# Patient Record
Sex: Male | Born: 1965 | Hispanic: No | Marital: Married | Smoking: Never smoker
Health system: Southern US, Community
[De-identification: ages and names within clinical notes are randomized; demographics above are authoritative.]

## PROBLEM LIST (undated history)

## (undated) DIAGNOSIS — I10 Essential (primary) hypertension: Secondary | ICD-10-CM

## (undated) DIAGNOSIS — K219 Gastro-esophageal reflux disease without esophagitis: Secondary | ICD-10-CM

## (undated) DIAGNOSIS — E785 Hyperlipidemia, unspecified: Secondary | ICD-10-CM

## (undated) DIAGNOSIS — I739 Peripheral vascular disease, unspecified: Secondary | ICD-10-CM

## (undated) DIAGNOSIS — E119 Type 2 diabetes mellitus without complications: Secondary | ICD-10-CM

## (undated) HISTORY — DX: Hyperlipidemia, unspecified: E78.5

## (undated) HISTORY — DX: Gastro-esophageal reflux disease without esophagitis: K21.9

## (undated) HISTORY — DX: Type 2 diabetes mellitus without complications: E11.9

## (undated) HISTORY — PX: OTHER SURGICAL HISTORY: SHX169

## (undated) HISTORY — PX: TONSILLECTOMY: SUR1361

## (undated) HISTORY — DX: Essential (primary) hypertension: I10

## (undated) HISTORY — PX: WISDOM TOOTH EXTRACTION: SHX21

## (undated) HISTORY — DX: Peripheral vascular disease, unspecified: I73.9

---

## 2017-04-19 ENCOUNTER — Ambulatory Visit (INDEPENDENT_AMBULATORY_CARE_PROVIDER_SITE_OTHER): Payer: BLUE CROSS/BLUE SHIELD

## 2017-04-19 ENCOUNTER — Encounter: Payer: Self-pay | Admitting: Family Medicine

## 2017-04-19 ENCOUNTER — Ambulatory Visit (INDEPENDENT_AMBULATORY_CARE_PROVIDER_SITE_OTHER): Payer: BLUE CROSS/BLUE SHIELD | Admitting: Family Medicine

## 2017-04-19 VITALS — BP 134/96 | HR 103 | Temp 98.8°F | Resp 18 | Ht 75.0 in | Wt 250.6 lb

## 2017-04-19 DIAGNOSIS — M542 Cervicalgia: Secondary | ICD-10-CM

## 2017-04-19 DIAGNOSIS — R739 Hyperglycemia, unspecified: Secondary | ICD-10-CM

## 2017-04-19 DIAGNOSIS — Z131 Encounter for screening for diabetes mellitus: Secondary | ICD-10-CM

## 2017-04-19 DIAGNOSIS — M5412 Radiculopathy, cervical region: Secondary | ICD-10-CM | POA: Diagnosis not present

## 2017-04-19 DIAGNOSIS — M898X1 Other specified disorders of bone, shoulder: Secondary | ICD-10-CM | POA: Diagnosis not present

## 2017-04-19 LAB — GLUCOSE, POCT (MANUAL RESULT ENTRY): POC GLUCOSE: 413 mg/dL — AB (ref 70–99)

## 2017-04-19 MED ORDER — CYCLOBENZAPRINE HCL 5 MG PO TABS: 5.0000 mg | ORAL_TABLET | Freq: Three times a day (TID) | ORAL | 0 refills | Status: DC | PRN

## 2017-04-19 MED ORDER — TRAMADOL HCL 50 MG PO TABS: 50.0000 mg | ORAL_TABLET | Freq: Four times a day (QID) | ORAL | 0 refills | Status: DC | PRN

## 2017-04-19 MED ORDER — METFORMIN HCL 500 MG PO TABS: 500.0000 mg | ORAL_TABLET | Freq: Two times a day (BID) | ORAL | 1 refills | Status: DC

## 2017-04-19 NOTE — Patient Instructions (Addendum)
Your upper back and chest/neck issue appears to be due to a pinched nerve. I will write for a different muscle relaxer, 1-2 pills up to every 8 hours as needed, but that can cause sedation. Avoid repetitive or heavy lifting and overhead lifting as much as possible for now. If pain starts to worsen again I also wrote for tramadol if needed for more breakthrough pain, but be careful combining that and Flexeril as both can cause sedation. If you do have worsening of your neck pain, the next step would be to meet with an orthopedist to determine other treatment options. Based on your elevated blood sugar today, I do not feel it would be safe to provide further doses of prednisone. So you do not withdraw form prednisone, ok to take one last pill in morning.   Start metformin 500 mg once per day for elevated blood sugar/diabetes. I will check her hemoglobin A1c which is a 3 month test for diabetes as well as electrolytes tonight. If any of those are concerning, we will call you. Follow-up in 10 days to discuss diabetes and other tests/treatment further. Please let me know if you have questions in the meantime.   Return to the clinic or go to the nearest emergency room if any of your symptoms worsen or new symptoms occur.   Cervical Radiculopathy Cervical radiculopathy happens when a nerve in the neck (cervical nerve) is pinched or bruised. This condition can develop because of an injury or as part of the normal aging process. Pressure on the cervical nerves can cause pain or numbness that runs from the neck all the way down into the arm and fingers. Usually, this condition gets better with rest. Treatment may be needed if the condition does not improve. What are the causes? This condition may be caused by:  Injury.  Slipped (herniated) disk.  Muscle tightness in the neck because of overuse.  Arthritis.  Breakdown or degeneration in the bones and joints of the spine (spondylosis) due to  aging.  Bone spurs that may develop near the cervical nerves.  What are the signs or symptoms? Symptoms of this condition include:  Pain that runs from the neck to the arm and hand. The pain can be severe or irritating. It may be worse when the neck is moved.  Numbness or weakness in the affected arm and hand.  How is this diagnosed? This condition may be diagnosed based on symptoms, medical history, and a physical exam. You may also have tests, including:  X-rays.  CT scan.  MRI.  Electromyogram (EMG).  Nerve conduction tests.  How is this treated? In many cases, treatment is not needed for this condition. With rest, the condition usually gets better over time. If treatment is needed, options may include:  Wearing a soft neck collar for short periods of time.  Physical therapy to strengthen your neck muscles.  Medicines, such as NSAIDs, oral corticosteroids, or spinal injections.  Surgery. This may be needed if other treatments do not help. Various types of surgery may be done depending on the cause of your problems.  Follow these instructions at home: Managing pain  Take over-the-counter and prescription medicines only as told by your health care provider.  If directed, apply ice to the affected area. ? Put ice in a plastic bag. ? Place a towel between your skin and the bag. ? Leave the ice on for 20 minutes, 2-3 times per day.  If ice does not help, you can try  using heat. Take a warm shower or warm bath, or use a heat pack as told by your health care provider.  Try a gentle neck and shoulder massage to help relieve symptoms. Activity  Rest as needed. Follow instructions from your health care provider about any restrictions on activities.  Do stretching and strengthening exercises as told by your health care provider or physical therapist. General instructions  If you were given a soft collar, wear it as told by your health care provider.  Use a flat pillow  when you sleep.  Keep all follow-up visits as told by your health care provider. This is important. Contact a health care provider if:  Your condition does not improve with treatment. Get help right away if:  Your pain gets much worse and cannot be controlled with medicines.  You have weakness or numbness in your hand, arm, face, or leg.  You have a high fever.  You have a stiff, rigid neck.  You lose control of your bowels or your bladder (have incontinence).  You have trouble with walking, balance, or speaking. This information is not intended to replace advice given to you by your health care provider. Make sure you discuss any questions you have with your health care provider. Document Released: 05/29/2001 Document Revised: 02/09/2016 Document Reviewed: 10/28/2014 Elsevier Interactive Patient Education  2018 [employer].    IF you received an x-ray today, you will receive an invoice from [facility]. Please contact [facility] Radiology at [phone] with questions or concerns regarding your invoice.   IF you received labwork today, you will receive an invoice from [place]. Please contact [facility] at [phone] with questions or concerns regarding your invoice.   Our billing staff will not be able to assist you with questions regarding bills from these companies.  You will be contacted with the lab results as soon as they are available. The fastest way to get your results is to activate your My Chart account. Instructions are located on the last page of this paperwork. If you have not heard from [place] regarding the results in 2 weeks, please contact this office.

## 2017-04-19 NOTE — Progress Notes (Addendum)
Subjective:  By signing my name below, I, David Crane, attest that this documentation has been prepared under the direction and in the presence of David Ray, MD. Electronically Signed: [name], [place]. 04/19/2017 , 4:25 PM .  Patient was seen in Room 11 .   Patient ID: David Crane, male    DOB: [birth year], 51 y.o.   MRN: [id number] Chief Complaint  Patient presents with  . Neck Pain    left side, x2 1/2 weeks, pt states neck pain and burning sensation at the base of the neck into the collar bone. Pt states it hurts to touch and wear a seat belt. He was seen by another doctor and was given steroid and a muscle relaxer.   HPI David Crane is a 51 y.o. male Here with left sided neck pain. CHL reviewed, no prior notes including notes from care everywhere.   Patient informs neck pain starting about 2 weeks ago (16 days ago). Around that time, he had an episode of vertigo, feeling the room spinning, and had multiple episodes of dry heaves and vomiting. He notes about 8 episodes of vomiting over 12 hours time, which worsened his neck pain. Prior to this, he did have some neck pain. About 3 days later, he still had some aches over the left side of his neck. He took an ibuprofen with temporary relief and was able to help his son move that day. He denies any known injury.   About 9 days ago (last Wednesday), he noticed his neck pain at base of his left neck radiating to upper back, left shoulder and to the front of his left chest; no pain or aches over his right side. He went to see Dr. [name] on [facility]. He didn't have imaging done at that time. He was given a muscle relaxer, Robaxin, and prednisone taper (6 days). Over the course of the taper, he felt better initially up to 66% improvement; however, 4 days ago, when he was towards the end of the prednisone taper, he felt the pain returning and worsening. He called in to Dr. [name] and received a second prednisone taper.  Again, he felt better during the beginning of his taper, but still hasn't gotten better. He has 2 days left of his 2nd prednisone taper.   He takes muscle relaxer as instructed, 2 pills every 4 hours. Today, he's taken 1 in the morning and hasn't taken another one during this afternoon. He's planning to travel down to [place], [place] next Wednesday (in 5 days) for a work [employer], and returns next week (stays for 2 days). He denies night sweats, fatigue, or unexpected weight loss. He denies prior neck issues. His mother has history of arthritis. He reports taken tylenol prior to coming to our office.   He works as a Biochemist, clinical, with occasional lifting, twisting and turning.   There are no active problems to display for this patient.  No past medical history on file. No past surgical history on file. No Known Allergies Prior to Admission medications   Not on File   Social History   Social History  . Marital status: Married    Spouse name: N/A  . Number of children: N/A  . Years of education: N/A   Occupational History  . Not on file.   Social History Main Topics  . Smoking status: Never Smoker  . Smokeless tobacco: Never Used  . Alcohol use No  . Drug use: No  .  Sexual activity: Not on file   Other Topics Concern  . Not on file   Social History Narrative  . No narrative on file   Review of Systems  Constitutional: Negative for fatigue and unexpected weight change.  Eyes: Negative for visual disturbance.  Respiratory: Positive for chest tightness. Negative for cough and shortness of breath.   Cardiovascular: Negative for chest pain, palpitations and leg swelling.  Gastrointestinal: Negative for abdominal pain and Crane in stool.  Endocrine: Positive for polydipsia and polyuria.  Musculoskeletal: Positive for arthralgias, myalgias and neck pain.  Neurological: Negative for dizziness, light-headedness and headaches.       Objective:   Physical Exam    Constitutional: He is oriented to person, place, and time. He appears well-developed and well-nourished. No distress.  HENT:  Head: Normocephalic and atraumatic.  Eyes: Pupils are equal, round, and reactive to light. EOM are normal.  Neck: Neck supple.  Cardiovascular: Normal rate, regular rhythm and normal heart sounds.  Exam reveals no gallop and no friction rub.   No murmur heard. Pulmonary/Chest: Effort normal and breath sounds normal. No respiratory distress. He has no wheezes.  Musculoskeletal: Normal range of motion.  C-spine: slight decreased extension, slight prominence of left clavicle, slight tenderness mid shaft; right lateral flexion has stretching sensation to left neck Paraspinals and trapezius non tender  Neurological: He is alert and oriented to person, place, and time.  Reflex Scores:      Tricep reflexes are 2+ on the right side and 2+ on the left side.      Bicep reflexes are 2+ on the right side and 2+ on the left side.      Brachioradialis reflexes are 2+ on the right side and 2+ on the left side. Skin: Skin is warm and dry.  Psychiatric: He has a normal mood and affect. His behavior is normal.  Nursing note and vitals reviewed.   Vitals:   04/19/17 1546  BP: (!) 134/96  Pulse: (!) 103  Resp: 18  Temp: 98.8 F (37.1 C)  TempSrc: Oral  SpO2: 97%  Weight: 250 lb 9.6 oz (113.7 kg)  Height: 6\' 3"  (1.905 m)   Dg Cervical Spine Complete  Result Date: 04/19/2017 CLINICAL DATA:  Burning pain in left side of the neck, chronic. No known injury. EXAM: CERVICAL SPINE - COMPLETE 4+ VIEW COMPARISON:  None. FINDINGS: Vertebral body height and alignment are maintained. There is straightening of the normal cervical lordosis. Loss of disc space height and endplate spurring are seen at C4-5, C5-6 and C6-7. Prevertebral soft tissues appear normal. Lung apices are clear. 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 Electronically Signed   By: [name] M.D.   On: 04/19/2017 16:37   Dg Clavicle Left  Result Date: 04/19/2017 CLINICAL DATA:  51 y/o  M; pain of the left lateral clavicle. EXAM: LEFT CLAVICLE - 2+ VIEWS COMPARISON:  None. FINDINGS: No acute fracture or dislocation identified. Normal crackle clavicular acromioclavicular distances. 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 IMPRESSION: Acromioclavicular joint osteophytosis with marginal osteophytes. No acute fracture or dislocation. Electronically Signed   By: Kristine Garbe M.D.   On: 04/19/2017 16:37       Assessment & Plan:  Over 45 minutes of care with repeat evaluation, plan development and discussion and review of history, greater than 50% counseling.   DEIONDRE HARROWER is a 51 y.o. male Left cervical radiculopathy - Plan: DG Cervical Spine Complete Neck pain on left  side - Plan: DG Cervical Spine Complete Pain of left clavicle - Plan: DG Clavicle Left  - Suspected C4 radiculopathy on the left with some interval improvement with initial 60-10mg  prednisone taper, some interval worsening towards end of first taper. No weakness, reflexes intact.  - Initially planned for possible repeat prednisone taper if current taper was not improving symptoms, but reported history of prediabetes when tested years ago, had not had recent glucose testing. Significantly elevated glucose at 413 in office today, will defer any further prednisone treatment.   - flexeril 5-10mg  tid prn. Side effects discussed.   - Ultram rx given if worsening pain, but discussed need for specialist eval if persistent or worsening   Screening for diabetes mellitus - Plan: POCT glucose (manual entry)  - Glucose 413 on random testing. Last ate approximately 5 hours ago. Verified history - he checked his Crane sugar on his own few years ago. It was 200-300 range (about 6-8 years ago).  Watching diet and trying to adjust sugar on his own, but avoiding sugary foods.   - has had  more thirst recently, polyuria. No nausea, vomiting, or abdominal pain recently. No blurry vion or other new symptoms.   - no primary care provider - agreed to testing and treatment with our office  - check BMP, A1c, start metformin 500 mg daily for 1 week, then increase to twice a day if tolerated. Potential side effects discussed. Recheck in 10 days.    Meds ordered this encounter  Medications  . predniSONE (DELTASONE) 10 MG tablet    Sig: Take 10 mg by mouth daily with breakfast.  . methocarbamol (ROBAXIN) 500 MG tablet    Sig: Take 500 mg by mouth 4 (four) times daily.   Patient Instructions    Your upper back and chest/neck issue appears to be due to a pinched nerve. Ok to continue prednisone taper, heat and gentle range of motion, muscle relaxant as needed.  Avoid repetitive or heavy lifting and overhead lifting as much as possible for now. If pain starts to worsen again, can start new prescription for prednisone (different taper). If you are not improving with that dose of prednisone, will need to have you evaluated by orthopaedics or obtain MRI.    Return to the clinic or go to the nearest emergency room if any of your symptoms worsen or new symptoms occur.   Cervical Radiculopathy Cervical radiculopathy happens when a nerve in the neck (cervical nerve) is pinched or bruised. This condition can develop because of an injury or as part of the normal aging process. Pressure on the cervical nerves can cause pain or numbness that runs from the neck all the way down into the arm and fingers. Usually, this condition gets better with rest. Treatment may be needed if the condition does not improve. What are the causes? This condition may be caused by:  Injury.  Slipped (herniated) disk.  Muscle tightness in the neck because of overuse.  Arthritis.  Breakdown or degeneration in the bones and joints of the spine (spondylosis) due to aging.  Bone spurs that may develop near the  cervical nerves.  What are the signs or symptoms? Symptoms of this condition include:  Pain that runs from the neck to the arm and hand. The pain can be severe or irritating. It may be worse when the neck is moved.  Numbness or weakness in the affected arm and hand.  How is this diagnosed? This condition may be diagnosed based on  symptoms, medical history, and a physical exam. You may also have tests, including:  X-rays.  CT scan.  MRI.  Electromyogram (EMG).  Nerve conduction tests.  How is this treated? In many cases, treatment is not needed for this condition. With rest, the condition usually gets better over time. If treatment is needed, options may include:  Wearing a soft neck collar for short periods of time.  Physical therapy to strengthen your neck muscles.  Medicines, such as NSAIDs, oral corticosteroids, or spinal injections.  Surgery. This may be needed if other treatments do not help. Various types of surgery may be done depending on the cause of your problems.  Follow these instructions at home: Managing pain  Take over-the-counter and prescription medicines only as told by your health care provider.  If directed, apply ice to the affected area. ? Put ice in a plastic bag. ? Place a towel between your skin and the bag. ? Leave the ice on for 20 minutes, 2-3 times per day.  If ice does not help, you can try using heat. Take a warm shower or warm bath, or use a heat pack as told by your health care provider.  Try a gentle neck and shoulder massage to help relieve symptoms. Activity  Rest as needed. Follow instructions from your health care provider about any restrictions on activities.  Do stretching and strengthening exercises as told by your health care provider or physical therapist. General instructions  If you were given a soft collar, wear it as told by your health care provider.  Use a flat pillow when you sleep.  Keep all follow-up visits  as told by your health care provider. This is important. Contact a health care provider if:  Your condition does not improve with treatment. Get help right away if:  Your pain gets much worse and cannot be controlled with medicines.  You have weakness or numbness in your hand, arm, face, or leg.  You have a high fever.  You have a stiff, rigid neck.  You lose control of your bowels or your bladder (have incontinence).  You have trouble with walking, balance, or speaking. This information is not intended to replace advice given to you by your health care provider. Make sure you discuss any questions you have with your health care provider. Document Released: 05/29/2001 Document Revised: 02/09/2016 Document Reviewed: 10/28/2014 Elsevier Interactive Patient Education  2018 [employer].    IF you received an x-Crane today, you will receive an invoice from [facility]. Please contact [facility] at [phone] with questions or concerns regarding your invoice.   IF you received labwork today, you will receive an invoice from [place] [facility]. Please contact [facility] at [phone] with questions or concerns regarding your invoice.   Our billing staff will not be able to assist you with questions regarding bills from these companies.  You will be contacted with the lab results as soon as they are available. The fastest way to get your results is to activate your My Chart account. Instructions are located on the last page of this paperwork. If you have not heard from [place] regarding the results in 2 weeks, please contact this office.      I personally performed the services described in this documentation, which was scribed in my presence. The recorded information has been reviewed and considered for accuracy and completeness, addended by me as needed, and agree with information above.  Signed,   David Ray, MD Primary Care at [facility].   04/19/17 5:19 PM

## 2017-04-20 LAB — BASIC METABOLIC PANEL
BUN / CREAT RATIO: 25 — AB (ref 9–20)
BUN: 36 mg/dL — AB (ref 6–24)
CALCIUM: 9.9 mg/dL (ref 8.7–10.2)
CHLORIDE: 91 mmol/L — AB (ref 96–106)
CO2: 20 mmol/L (ref 20–29)
CREATININE: 1.45 mg/dL — AB (ref 0.76–1.27)
GFR calc non Af Amer: 55 mL/min/{1.73_m2} — ABNORMAL LOW (ref >59)
GFR, EST AFRICAN AMERICAN: 64 mL/min/{1.73_m2} (ref >59)
Glucose: 409 mg/dL — ABNORMAL HIGH (ref 65–99)
Potassium: 5.4 mmol/L — ABNORMAL HIGH (ref 3.5–5.2)
Sodium: 132 mmol/L — ABNORMAL LOW (ref 134–144)

## 2017-04-20 LAB — HEMOGLOBIN A1C
Est. average glucose Bld gHb Est-mCnc: 312 mg/dL
Hgb A1c MFr Bld: 12.5 % — ABNORMAL HIGH (ref 4.8–5.6)

## 2017-04-22 ENCOUNTER — Telehealth: Payer: Self-pay | Admitting: Family Medicine

## 2017-04-22 NOTE — Telephone Encounter (Signed)
PATIENT WAS SEEN Friday (04/19/17) BY DR. Carlota Raspberry. HE PRESCRIBED HIM TO HAVE METFORMIN 500 MG, CYCLOBENZAPRINE (FLEXERIL) 5 MG AND TRAMADOL (ULTRAM) 50 MG. HE SAID HE IS HAVING SIDE EFFECTS FROM ONE OF THESE. HE IS DIZZY, WEAK, NO ENERGY AND HIS STOMACH HAS BEEN UPSET. 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 HE WOULD LIKE TO KNOW WHAT HE SHOULD DO? BEST PHONE [phone] (HOME) [place]. [place]

## 2017-04-22 NOTE — Telephone Encounter (Signed)
Pt states, since taking new cyclobenzaprine medication he noticed feeling dizzy, weak and slight GI upset.  States, GI sx's has improved yesterday. Taking Tramadol every 8 hrs as needed and Flexeril TID as needed.  Advised, to hold off on the Flexeril for today to determine sx's; if no improvement call clinic for re-evaluation.   Verbalized understanding

## 2017-04-29 ENCOUNTER — Ambulatory Visit (INDEPENDENT_AMBULATORY_CARE_PROVIDER_SITE_OTHER): Payer: BLUE CROSS/BLUE SHIELD | Admitting: Family Medicine

## 2017-04-29 ENCOUNTER — Encounter: Payer: Self-pay | Admitting: Family Medicine

## 2017-04-29 VITALS — BP 123/84 | HR 105 | Temp 98.5°F | Resp 18 | Ht 75.0 in | Wt 250.0 lb

## 2017-04-29 DIAGNOSIS — G6289 Other specified polyneuropathies: Secondary | ICD-10-CM

## 2017-04-29 DIAGNOSIS — E1165 Type 2 diabetes mellitus with hyperglycemia: Secondary | ICD-10-CM

## 2017-04-29 DIAGNOSIS — E1121 Type 2 diabetes mellitus with diabetic nephropathy: Secondary | ICD-10-CM | POA: Diagnosis not present

## 2017-04-29 DIAGNOSIS — G63 Polyneuropathy in diseases classified elsewhere: Secondary | ICD-10-CM

## 2017-04-29 DIAGNOSIS — R5383 Other fatigue: Secondary | ICD-10-CM

## 2017-04-29 DIAGNOSIS — E876 Hypokalemia: Secondary | ICD-10-CM

## 2017-04-29 DIAGNOSIS — R7989 Other specified abnormal findings of blood chemistry: Secondary | ICD-10-CM

## 2017-04-29 DIAGNOSIS — M5412 Radiculopathy, cervical region: Secondary | ICD-10-CM

## 2017-04-29 LAB — GLUCOSE, POCT (MANUAL RESULT ENTRY): POC GLUCOSE: 261 mg/dL — AB (ref 70–99)

## 2017-04-29 MED ORDER — BLOOD GLUCOSE METER KIT: PACK | 0 refills | Status: AC

## 2017-04-29 MED ORDER — GLIPIZIDE 5 MG PO TABS: 5.0000 mg | ORAL_TABLET | Freq: Every day | ORAL | 2 refills | Status: DC

## 2017-04-29 MED ORDER — GLIPIZIDE 10 MG PO TABS: 5.0000 mg | ORAL_TABLET | Freq: Every day | ORAL | 2 refills | Status: DC

## 2017-04-29 NOTE — Patient Instructions (Addendum)
  For neck pain/radiculopathy - can hold on new medications as it is improving.  If more sore, then try tramadol OR flexeril once - stop if new side effects. If continued pain or not improving  - will need to meet with orthopaedist.  For diabetes:  Check blood sugar twice per day (fasting, 2 hours after meals, and bedtime).  Keep record and return with those readings to next appointment.  I will refer you to diabetes classes (but you can check into cost of those classes).  You appear to have some peripheral neuropathy - likely from diabetes. If you have pain - there are some med options. Let me know.  Kidney function was borderline last time, but not at a level where we need to stop metformin. Okay to increase metformin to twice per day, and if blood sugars remain over 200 after taking metformin for next week, then start  glipizide 5mg  with largest meal of the day.   IF you received an x-ray today, you will receive an invoice from [facility]. Please contact [facility] Radiology at [phone] with questions or concerns regarding your invoice.   IF you received labwork today, you will receive an invoice from [place]. Please contact [facility] at [phone] with questions or concerns regarding your invoice.   Our billing staff will not be able to assist you with questions regarding bills from these companies.  You will be contacted with the lab results as soon as they are available. The fastest way to get your results is to activate your My Chart account. Instructions are located on the last page of this paperwork. If you have not heard from [place] regarding the results in 2 weeks, please contact this office.

## 2017-04-29 NOTE — Progress Notes (Signed)
Subjective:  By signing my name below, I, [name], attest that this documentation has been prepared under the direction and in the presence of [name], MD. Electronically Signed: [name], [place]. 04/29/2017 , 5:15 PM .  Patient was seen in Room 25 .   Patient ID: David Crane, male    DOB: [birth year], 51 y.o.   MRN: [id number] Chief Complaint  Patient presents with  . Follow-up    diabetes   HPI David Crane is a 51 y.o. male Here for follow up on new diagnosis of diabetes. He was initially seen on Aug 3rd with persistent left cervical radiculopathy.   Diabetes Prior to starting prednisone, we discussed glycemic testing. He had some elevated blood sugars in the past but did not have recent testing. His glucose was 413 in office at that visit. He was started on metformin 561m QD with plan to increase to BID. CO2 was normal, A1C was 12.5.   Results for orders placed or performed in visit on 04/19/17  Hemoglobin A1c  Result Value Ref Range   Hgb A1c MFr Bld 12.5 (H) 4.8 - 5.6 %   Est. average glucose Bld gHb Est-mCnc 312 mg/dL  Basic metabolic panel  Result Value Ref Range   Glucose 409 (H) 65 - 99 mg/dL   BUN 36 (H) 6 - 24 mg/dL   Creatinine, Ser 1.45 (H) 0.76 - 1.27 mg/dL   GFR calc non Af Amer 55 (L) >59 mL/min/1.73   GFR calc Af Amer 64 >59 mL/min/1.73   BUN/Creatinine Ratio 25 (H) 9 - 20   Sodium 132 (L) 134 - 144 mmol/L   Potassium 5.4 (H) 3.5 - 5.2 mmol/L   Chloride 91 (L) 96 - 106 mmol/L   CO2 20 20 - 29 mmol/L   Calcium 9.9 8.7 - 10.2 mg/dL  POCT glucose (manual entry)  Result Value Ref Range   POC Glucose 413 (A) 70 - 99 mg/dl   Fatigue: He states he's been feeling like he has no energy over the past weekend. He was able to return to work today, and had more energy today than yesterday. He denies urinary symptoms.   Glucometer: He has one at home but it ran out of batteries. He hasn't checked his blood sugar for a long time.    Numbness/tingling in feet: He informs having numbness and tingling in his toes for a few years. He describes the numbness being sharp shooting pains, like cramp. After taking Metformin, these pains have gone away. He also mentions having charlie horse leg cramps in the past and took potassium pills.   Urinary frequency: This has improved.   Dizziness Telephone note reviewed from Aug 6th, complained of weakness, dizziness, and slight GI upset. He was advised to stop Flexeril temporarily.   After stopping the Flexeril, patient states the symptoms improved. He still had some diarrhea about 8-9 days ago, but these symptoms have resolved. He denies vomiting or nausea. He notes still having occasional dizziness that lasts for a few minutes. He mentions having history of vertigo.   Elevated creatinine Elevated creatinine at 1.45 with eGFR 55 at last visit. His sodium and potassium were borderline normal, but thought due to hyperglycemia.   Cervical radiculopathy Due to hyperglycemia, prednisone was not given. He had received 2 prior prednisone treatments. He was treated with Flexeril and Tramadol.   Patient states his neck pain is still present, but it's been improving each day. After stopping the Flexeril, he hasn't  tried taking the Tramadol by itself. He denies any pain radiating down into his arms. He hasn't taken any medication for his neck pain for past 8 days.   There are no active problems to display for this patient.  History reviewed. No pertinent past medical history. History reviewed. No pertinent surgical history. No Known Allergies Prior to Admission medications   Medication Sig Start Date End Date Taking? Authorizing Provider  cyclobenzaprine (FLEXERIL) 5 MG tablet Take 1-2 tablets (5-10 mg total) by mouth 3 (three) times daily as needed. 04/19/17   Wendie Agreste, MD  metFORMIN (GLUCOPHAGE) 500 MG tablet Take 1 tablet (500 mg total) by mouth 2 (two) times daily with a meal.  Start with once per day for now, then increase to twice in 1 week if tolerated. 04/19/17   Wendie Agreste, MD  predniSONE (DELTASONE) 10 MG tablet Take 10 mg by mouth daily with breakfast.    [provider]  traMADol (ULTRAM) 50 MG tablet Take 1 tablet (50 mg total) by mouth every 6 (six) hours as needed. 04/19/17   Wendie Agreste, MD   Social History   Social History  . Marital status: Married    Spouse name: N/A  . Number of children: N/A  . Years of education: N/A   Occupational History  . Not on file.   Social History Main Topics  . Smoking status: Never Crane  . Smokeless tobacco: Never Used  . Alcohol use No  . Drug use: No  . Sexual activity: Not on file   Other Topics Concern  . Not on file   Social History Narrative  . No narrative on file   Review of Systems  Constitutional: Positive for fatigue. Negative for unexpected weight change.  Eyes: Negative for visual disturbance.  Respiratory: Negative for cough, chest tightness and shortness of breath.   Cardiovascular: Negative for chest pain, palpitations and leg swelling.  Gastrointestinal: Negative for abdominal pain, blood in stool, diarrhea, nausea and vomiting.  Genitourinary: Negative for frequency.  Musculoskeletal: Negative for myalgias, neck pain and neck stiffness.  Neurological: Negative for dizziness, light-headedness, numbness and headaches.       Objective:   Physical Exam  Constitutional: He is oriented to person, place, and time. He appears well-developed and well-nourished. No distress.  HENT:  Head: Normocephalic and atraumatic.  Eyes: Pupils are equal, round, and reactive to light. EOM are normal.  Neck: Neck supple.  Cardiovascular: Normal rate.   Pulses:      Dorsalis pedis pulses are 2+ on the right side, and 2+ on the left side.  Cap refill <1 second in toes  Pulmonary/Chest: Effort normal. No respiratory distress.  Musculoskeletal: Normal range of motion.  Left foot:  callous on plantar metatarsal area Right foot: similar pattern on his right foot, with some pits in the same areas C-spine: pain free ROM  Neurological: He is alert and oriented to person, place, and time.  Sensation intact into the distal toes  Skin: Skin is warm and dry.  Psychiatric: He has a normal mood and affect. His behavior is normal.  Nursing note and vitals reviewed.   Vitals:   04/29/17 1628  BP: 123/84  Pulse: (!) 105  Resp: 18  Temp: 98.5 F (36.9 C)  TempSrc: Oral  SpO2: 97%  Weight: 250 lb (113.4 kg)  Height: _0  (1.905 m)   Results for orders placed or performed in visit on 04/29/17  Microalbumin, urine  Result Value Ref Range  Albumin, Urine 611.8 Not Estab. ug/mL  Basic metabolic panel  Result Value Ref Range   Glucose 269 (H) 65 - 99 mg/dL   BUN 24 6 - 24 mg/dL   Creatinine, Ser 1.30 (H) 0.76 - 1.27 mg/dL   GFR calc non Af Amer 63 >59 mL/min/1.73   GFR calc Af Amer 73 >59 mL/min/1.73   BUN/Creatinine Ratio 18 9 - 20   Sodium 137 134 - 144 mmol/L   Potassium 5.3 (H) 3.5 - 5.2 mmol/L   Chloride 99 96 - 106 mmol/L   CO2 22 20 - 29 mmol/L   Calcium 9.7 8.7 - 10.2 mg/dL  POCT glucose (manual entry)  Result Value Ref Range   POC Glucose 261 (A) 70 - 99 mg/dl      Assessment & Plan:   David Crane is a 51 y.o. male Left cervical radiculopathy  - Improving, status post prednisone. RTC precautions if persistent as may need imaging or orthopedic eval.  -If slight increased discomfort, has Flexeril or tramadol if needed, potential side effects discussed and advised not to combine.  Type 2 diabetes mellitus with hyperglycemia, without long-term current use of insulin ([place]) - Plan: Ambulatory referral to diabetic education, Microalbumin, urine, POCT glucose (manual entry), Basic metabolic panel, blood glucose meter kit and supplies  -New diagnosis of diabetes, with hyperglycemia, secondary diabetic nephropathy with microalbuminuria,  diabetic  neuropathy based on foot exam in office.   -Improving glucose, will increase metformin to twice a day dosing, start glipizide if persistent readings over 200 and recheck in 2 weeks.  -Referred to diabetic educator, glucose meter kit provided for home monitoring  Fatigue, unspecified type Elevated serum creatinine - Plan: Basic metabolic panel  -Labs as above, borderline creatinine previously - overall stable. Suspect fatigue to improve as hyper glycemia improves. RTC precautions.  Other polyneuropathy  -Suspected diabetic peripheral neuropathy. Denies pain at present time, consider gabapentin if more symptomatic. Glycemic control as above.  Hypokalemia  -Likely related to degree of hyperglycemia initially. Now borderline elevated. Recheck in 2 weeks.  Meds ordered this encounter  Medications  . blood glucose meter kit and supplies    Sig: Dispense based on patient and insurance preference. Check twice per day. FOR ICD-9 250.00, 250.01).    Dispense:  1 each    Refill:  0    Order Specific Question:   Number of strips    Answer:   65    Order Specific Question:   Number of lancets    Answer:   2  . DISCONTD: glipiZIDE (GLUCOTROL) 10 MG tablet    Sig: Take 0.5 tablets (5 mg total) by mouth daily before breakfast.    Dispense:  30 tablet    Refill:  2  . glipiZIDE (GLUCOTROL) 5 MG tablet    Sig: Take 1 tablet (5 mg total) by mouth daily before breakfast.    Dispense:  30 tablet    Refill:  2   Patient Instructions    For neck pain/radiculopathy - can hold on new medications as it is improving.  If more sore, then try tramadol OR flexeril once - stop if new side effects. If continued pain or not improving  - will need to meet with orthopaedist.  For diabetes:  Check blood sugar twice per day (fasting, 2 hours after meals, and bedtime).  Keep record and return with those readings to next appointment.  I will refer you to diabetes classes (but you can check into cost of  those  classes).  You appear to have some peripheral neuropathy - likely from diabetes. If you have pain - there are some med options. Let me know.  Kidney function was borderline last time, but not at a level where we need to stop metformin. Okay to increase metformin to twice per day, and if blood sugars remain over 200 after taking metformin for next week, then start  glipizide 74m with largest meal of the day.   IF you received an x-ray today, you will [facility]. Please contact [facility] at [phone] questions or concerns regarding your invoice.   IF you received labwork today, you will receive an invoice from [place] Please contact [facility] at [phone] questions or concerns regarding your invoice.   Our billing staff will not be able to assist you with questions regarding bills from these companies.  You will be contacted with the lab results as soon as they are available. The fastest way to get your results is to activate your My Chart account. Instructions are located on the last page of this paperwork. If you have not heard from [place] results in [place] weeks, please contact this office.       I personally performed the services described in this documentation, which was scribed in my presence. The recorded information has been reviewed and considered for accuracy and completeness, addended by me as needed, and agree with information above.  Signed,   JMerri Ray MD [facility]  05/01/17 3:00 PM

## 2017-04-30 LAB — BASIC METABOLIC PANEL
BUN/Creatinine Ratio: 18 (ref 9–20)
BUN: 24 mg/dL (ref 6–24)
CALCIUM: 9.7 mg/dL (ref 8.7–10.2)
CO2: 22 mmol/L (ref 20–29)
Chloride: 99 mmol/L (ref 96–106)
Creatinine, Ser: 1.3 mg/dL — ABNORMAL HIGH (ref 0.76–1.27)
GFR, EST AFRICAN AMERICAN: 73 mL/min/{1.73_m2} (ref >59)
GFR, EST NON AFRICAN AMERICAN: 63 mL/min/{1.73_m2} (ref >59)
Glucose: 269 mg/dL — ABNORMAL HIGH (ref 65–99)
POTASSIUM: 5.3 mmol/L — AB (ref 3.5–5.2)
Sodium: 137 mmol/L (ref 134–144)

## 2017-04-30 LAB — MICROALBUMIN, URINE: Microalbumin, Urine: 611.8 ug/mL

## 2017-05-01 DIAGNOSIS — E119 Type 2 diabetes mellitus without complications: Secondary | ICD-10-CM | POA: Insufficient documentation

## 2017-05-01 DIAGNOSIS — E1121 Type 2 diabetes mellitus with diabetic nephropathy: Secondary | ICD-10-CM | POA: Insufficient documentation

## 2017-05-01 DIAGNOSIS — G629 Polyneuropathy, unspecified: Secondary | ICD-10-CM | POA: Insufficient documentation

## 2017-05-16 ENCOUNTER — Encounter: Payer: Self-pay | Admitting: Family Medicine

## 2017-05-16 ENCOUNTER — Ambulatory Visit (INDEPENDENT_AMBULATORY_CARE_PROVIDER_SITE_OTHER): Payer: BLUE CROSS/BLUE SHIELD | Admitting: Family Medicine

## 2017-05-16 VITALS — BP 107/75 | HR 97 | Temp 98.3°F | Resp 18 | Ht 75.0 in | Wt 256.2 lb

## 2017-05-16 DIAGNOSIS — G629 Polyneuropathy, unspecified: Secondary | ICD-10-CM

## 2017-05-16 DIAGNOSIS — R252 Cramp and spasm: Secondary | ICD-10-CM

## 2017-05-16 DIAGNOSIS — R809 Proteinuria, unspecified: Secondary | ICD-10-CM

## 2017-05-16 DIAGNOSIS — R7989 Other specified abnormal findings of blood chemistry: Secondary | ICD-10-CM | POA: Diagnosis not present

## 2017-05-16 DIAGNOSIS — E1129 Type 2 diabetes mellitus with other diabetic kidney complication: Secondary | ICD-10-CM | POA: Diagnosis not present

## 2017-05-16 MED ORDER — METFORMIN HCL 500 MG PO TABS: 500.0000 mg | ORAL_TABLET | Freq: Two times a day (BID) | ORAL | 1 refills | Status: DC

## 2017-05-16 MED ORDER — GABAPENTIN 300 MG PO CAPS
300.0000 mg | ORAL_CAPSULE | Freq: Every day | ORAL | 3 refills | Status: DC
Start: 2017-05-16 — End: 2017-07-25

## 2017-05-16 MED ORDER — GLIPIZIDE 5 MG PO TABS: 5.0000 mg | ORAL_TABLET | Freq: Every day | ORAL | 1 refills | Status: DC

## 2017-05-16 NOTE — Patient Instructions (Addendum)
Continue metformin 500mg  twice per day.  Ok to take glipizide with breakfast or dinner, but if you have any low readings, return to just metformin.   Avoid any nsaids like ibuprofen or alleve to help protect kidneys. May sure to drink plenty of water throughout the day.   Try gabapentin at bedtime. Stop that medication if it causes sedation dizziness or other side effects during the day.  Okay to check blood sugar at fasting, 2 hours after meals, or at bedtime. Take 1 or 2 each day only.   Return in  one month for repeat blood tests including kidney function, potassium, other electrolytes. Do not take potassium over-the-counter at this time as your previous levels were borderline high, not low.   Return to the clinic or go to the nearest emergency room if any of your symptoms worsen or new symptoms occur.   Carbohydrate Counting for Diabetes Mellitus, Adult Carbohydrate counting is a method for keeping track of how many carbohydrates you eat. Eating carbohydrates naturally increases the amount of sugar (glucose) in the blood. Counting how many carbohydrates you eat helps keep your blood glucose within normal limits, which helps you manage your diabetes (diabetes mellitus). It is important to know how many carbohydrates you can safely have in each meal. This is different for every person. A diet and nutrition specialist (registered dietitian) can help you make a meal plan and calculate how many carbohydrates you should have at each meal and snack. Carbohydrates are found in the following foods:  Grains, such as breads and cereals.  Dried beans and soy products.  Starchy vegetables, such as potatoes, peas, and corn.  Fruit and fruit juices.  Milk and yogurt.  Sweets and snack foods, such as cake, cookies, candy, chips, and soft drinks.  How do I count carbohydrates? There are two ways to count carbohydrates in food. You can use either of the methods or a combination of both. Reading  "Nutrition Facts" on packaged food The "Nutrition Facts" list is included on the labels of almost all packaged foods and beverages in the [place] It includes:  The serving size.  Information about nutrients in each serving, including the grams (g) of carbohydrate per serving.  To use the "Nutrition Facts":  Decide how many servings you will have.  Multiply the number of servings by the number of carbohydrates per serving.  The resulting number is the total amount of carbohydrates that you will be having.  Learning standard serving sizes of other foods When you eat foods containing carbohydrates that are not packaged or do not include "Nutrition Facts" on the label, you need to measure the servings in order to count the amount of carbohydrates:  Measure the foods that you will eat with a food scale or measuring cup, if needed.  Decide how many standard-size servings you will eat.  Multiply the number of servings by 15. Most carbohydrate-rich foods have about 15 g of carbohydrates per serving. ? For example, if you eat 8 oz (170 g) of strawberries, you will have eaten 2 servings and 30 g of carbohydrates (2 servings x 15 g = 30 g).  For foods that have more than one food mixed, such as soups and casseroles, you must count the carbohydrates in each food that is included.  The following list contains standard serving sizes of common carbohydrate-rich foods. Each of these servings has about 15 g of carbohydrates:   hamburger bun or  English muffin.   oz (15 mL) syrup.  oz (14 g) jelly.  1 slice of bread.  1 six-inch tortilla.  3 oz (85 g) cooked rice or pasta.  4 oz (113 g) cooked dried beans.  4 oz (113 g) starchy vegetable, such as peas, corn, or potatoes.  4 oz (113 g) hot cereal.  4 oz (113 g) mashed potatoes or  of a large baked potato.  4 oz (113 g) canned or frozen fruit.  4 oz (120 mL) fruit juice.  4-6 crackers.  6 chicken nuggets.  6 oz (170 g)  unsweetened dry cereal.  6 oz (170 g) plain fat-free yogurt or yogurt sweetened with artificial sweeteners.  8 oz (240 mL) milk.  8 oz (170 g) fresh fruit or one small piece of fruit.  24 oz (680 g) popped popcorn.  Example of carbohydrate counting Sample meal  3 oz (85 g) chicken breast.  6 oz (170 g) brown rice.  4 oz (113 g) corn.  8 oz (240 mL) milk.  8 oz (170 g) strawberries with sugar-free whipped topping. Carbohydrate calculation 1. Identify the foods that contain carbohydrates: ? Rice. ? Corn. ? Milk. ? Strawberries. 2. Calculate how many servings you have of each food: ? 2 servings rice. ? 1 serving corn. ? 1 serving milk. ? 1 serving strawberries. 3. Multiply each number of servings by 15 g: ? 2 servings rice x 15 g = 30 g. ? 1 serving corn x 15 g = 15 g. ? 1 serving milk x 15 g = 15 g. ? 1 serving strawberries x 15 g = 15 g. 4. Add together all of the amounts to find the total grams of carbohydrates eaten: ? 30 g + 15 g + 15 g + 15 g = 75 g of carbohydrates total. This information is not intended to replace advice given to you by your health care provider. Make sure you discuss any questions you have with your health care provider. Document Released: 09/03/2005 Document Revised: 03/23/2016 Document Reviewed: 02/15/2016 Elsevier Interactive Patient Education  2018 [employer].        IF you received an x-ray today, you will receive an invoice from [facility]. Please contact [facility] at [phone] with questions or concerns regarding your invoice.   IF you received labwork today, you will receive an invoice from [place]. Please contact [facility] at [phone] with questions or concerns regarding your invoice.   Our billing staff will not be able to assist you with questions regarding bills from these companies.  You will be contacted with the lab results as soon as they are available. The fastest way to get your results is  to activate your My Chart account. Instructions are located on the last page of this paperwork. If you have not heard from [place] regarding the results in 2 weeks, please contact this office.

## 2017-05-16 NOTE — Progress Notes (Signed)
 Subjective:  By signing my name below, I, David Crane, attest that this documentation has been prepared under the direction and in the presence of David Greene, MD. Electronically Signed: Tsung-Kai Crane, Scribe. 05/16/2017 , 5:34 PM .  Patient was seen in Room 25 .   Patient ID: David Crane, male    DOB: 05/25/1966, 51 y.o.   MRN: [id number] Chief Complaint  Patient presents with  . Diabetes    f/u- with home readings   HPI David Crane is a 51 y.o. male Here for follow up on diabetes. See prior visit, he is a newly diagnosed diabetic on Aug 3rd and a follow up on 13th. His initial A1C was 12.5 with glucose reading of 413 at initial visit. His glucose was down at 261 at last visit; continued on metformin but to 500mg BID. He was referred to diabetic education. He did have some signs of possible nephropathy with creatinine 1.45 to 1.30 at last visit, as well as microalbuminuria with reading of 611 at last visit. Patient was also started on half pill glipizide if blood sugars remain over 200s even after metformin.   His home readings show 200s down to 175 in the past 2 days, and evening readings are in the mid-high 100s down to 96 last night.   Patient states he started glipizide full pill 6 days ago, at supper time. The next day, when he checked his sugar, it was 185. He's been taking glipizide at night, and at last night's reading of 96 at 10:30PM, he was feeling "jittery". He notes he had a low carb meal last night. He was referred to diabetic education but was cost prohibited. He will defer nutritionist counseling for now, and will continue on diet changes. Although, his wife will look into nutritionist counseling at [facility]. He's also noticed his sugar is about 23-25 higher in the morning versus his reading at night. He's drinking more water, and reduced his intake of diet Mountain Dew. He denies shortness of breath, chest pain, chest tightness, lightheadedness, or dizziness. He has  8-9 pills of metformin left.   He also mentions having leg cramps at night when he's sleeping ongoing for years. He attributes partially to being on his feet a lot for work. He was previously taking OTC potassium without relief, but hasn't for about 2 months now. His potassium was borderline elevated at 5.3 at last visit.   Patient Active Problem List   Diagnosis Date Noted  . Diabetic nephropathy (HCC) 05/01/2017  . Diabetes (HCC) 05/01/2017  . Peripheral neuropathy 05/01/2017   History reviewed. No pertinent past medical history. History reviewed. No pertinent surgical history. No Known Allergies Prior to Admission medications   Medication Sig Start Date End Date Taking? Authorizing Provider  blood glucose meter kit and supplies Dispense based on patient and insurance preference. Check twice per day. FOR ICD-9 250.00, 250.01). 04/29/17   Crane, David R, MD  glipiZIDE (GLUCOTROL) 5 MG tablet Take 1 tablet (5 mg total) by mouth daily before breakfast. 04/29/17   Crane, David R, MD  metFORMIN (GLUCOPHAGE) 500 MG tablet Take 1 tablet (500 mg total) by mouth 2 (two) times daily with a meal. Start with once per day for now, then increase to twice in 1 week if tolerated. 04/19/17   Crane, David R, MD   Social History   Social History  . Marital status: Married    Spouse name: N/A  . Number of children: N/A  . Years of   education: N/A   Occupational History  . Not on file.   Social History Main Topics  . Smoking status: Never Smoker  . Smokeless tobacco: Never Used  . Alcohol use Yes     Comment: occ  . Drug use: No  . Sexual activity: Yes   Other Topics Concern  . Not on file   Social History Narrative  . No narrative on file   Review of Systems  Constitutional: Negative for fatigue and unexpected weight change.  Eyes: Negative for visual disturbance.  Respiratory: Negative for cough, chest tightness and shortness of breath.   Cardiovascular: Negative for chest pain,  palpitations and leg swelling.  Gastrointestinal: Negative for abdominal pain and blood in stool.  Musculoskeletal: Positive for myalgias.  Neurological: Negative for dizziness, light-headedness and headaches.       Objective:   Physical Exam  Constitutional: He is oriented to person, place, and time. He appears well-developed and well-nourished.  HENT:  Head: Normocephalic and atraumatic.  Eyes: Pupils are equal, round, and reactive to light. EOM are normal.  Neck: No JVD present. Carotid bruit is not present.  Cardiovascular: Normal rate, regular rhythm and normal heart sounds.   No murmur heard. Pulmonary/Chest: Effort normal and breath sounds normal. He has no rales.  Musculoskeletal: He exhibits no edema (no lower extremity edema) or tenderness (calves non tender bilaterally).  Neurological: He is alert and oriented to person, place, and time.  Skin: Skin is warm and dry.  Psychiatric: He has a normal mood and affect.  Vitals reviewed.   Vitals:   05/16/17 1651  BP: 107/75  Pulse: 97  Resp: 18  Temp: 98.3 F (36.8 C)  TempSrc: Oral  SpO2: 97%  Weight: 256 lb 3.2 oz (116.2 kg)  Height: 6' 3" (1.905 m)   Wt Readings from Last 3 Encounters:  05/16/17 256 lb 3.2 oz (116.2 kg)  04/29/17 250 lb (113.4 kg)  04/19/17 250 lb 9.6 oz (113.7 kg)       Assessment & Plan:    [name] is a 51 y.o. male Type 2 diabetes mellitus with microalbuminuria, without long-term current use of insulin (HCC) - Plan: glipiZIDE (GLUCOTROL) 5 MG tablet, metFORMIN (GLUCOPHAGE) 500 MG tablet  - Improving readings. Low in 90's, but less po that night.   -Continue metformin 500 mg twice a day, glipizide with breakfast or one meal, cautioned of hypoglycemia.  - Classes/nutrition eval was cost prohibitive. Handout given with carb counting, but can look into other options or nutritionist as needed.  -Recheck 1 month with repeat labs  Peripheral polyneuropathy - Plan: gabapentin  (NEURONTIN) 300 MG capsule Leg cramps - Plan: gabapentin (NEURONTIN) 300 MG capsule  - leg cramps more after he has been working for a few days, but had some findings of peripheral neuropathy and previous foot exam. Option of Neurontin at bedtime given, stop if new side effects, potential  side effects discussed  Elevated serum creatinine  - Improved last visit. Plan on repeat testing in one month. Avoid nsaids, maintain hydration.    Meds ordered this encounter  Medications  . gabapentin (NEURONTIN) 300 MG capsule    Sig: Take 1 capsule (300 mg total) by mouth at bedtime.    Dispense:  30 capsule    Refill:  3  . glipiZIDE (GLUCOTROL) 5 MG tablet    Sig: Take 1 tablet (5 mg total) by mouth daily before breakfast.    Dispense:  90 tablet    Refill:    1  . metFORMIN (GLUCOPHAGE) 500 MG tablet    Sig: Take 1 tablet (500 mg total) by mouth 2 (two) times daily with a meal.    Dispense:  180 tablet    Refill:  1   Patient Instructions    Continue metformin 566m twice per day.  Ok to take glipizide with breakfast or dinner, but if you have any low readings, return to just metformin.   Avoid any nsaids like ibuprofen or alleve to help protect kidneys. May sure to drink plenty of water throughout the day.   Try gabapentin at bedtime. Stop that medication if it causes sedation dizziness or other side effects during the day.  Okay to check blood sugar at fasting, 2 hours after meals, or at bedtime. Take 1 or 2 each day only.   Return in  one month for repeat blood tests including kidney function, potassium, other electrolytes. Do not take potassium over-the-counter at this time as your previous levels were borderline high, not low.   Return to the clinic or go to the nearest emergency room if any of your symptoms worsen or new symptoms occur.   Carbohydrate Counting for Diabetes Mellitus, Adult Carbohydrate counting is a method for keeping track of how many carbohydrates you eat. Eating  carbohydrates naturally increases the amount of sugar (glucose) in the blood. Counting how many carbohydrates you eat helps keep your blood glucose within normal limits, which helps you manage your diabetes (diabetes mellitus). It is important to know how many carbohydrates you can safely have in each meal. This is different for every person. A diet and nutrition specialist (registered dietitian) can help you make a meal plan and calculate how many carbohydrates you should have at each meal and snack. Carbohydrates are found in the following foods:  Grains, such as breads and cereals.  Dried beans and soy products.  Starchy vegetables, such as potatoes, peas, and corn.  Fruit and fruit juices.  Milk and yogurt.  Sweets and snack foods, such as cake, cookies, candy, chips, and soft drinks.  How do I count carbohydrates? There are two ways to count carbohydrates in food. You can use either of the methods or a combination of both. Reading "Nutrition Facts" on packaged food The "Nutrition Facts" list is included on the labels of almost all packaged foods and beverages in the [place] It includes:  The serving size.  Information about nutrients in each serving, including the grams (g) of carbohydrate per serving.  To use the "Nutrition Facts":  Decide how many servings you will have.  Multiply the number of servings by the number of carbohydrates per serving.  The resulting number is the total amount of carbohydrates that you will be having.  Learning standard serving sizes of other foods When you eat foods containing carbohydrates that are not packaged or do not include "Nutrition Facts" on the label, you need to measure the servings in order to count the amount of carbohydrates:  Measure the foods that you will eat with a food scale or measuring cup, if needed.  Decide how many standard-size servings you will eat.  Multiply the number of servings by 15. Most carbohydrate-rich foods  have about 15 g of carbohydrates per serving. ? For example, if you eat 8 oz (170 g) of strawberries, you will have eaten 2 servings and 30 g of carbohydrates (2 servings x 15 g = 30 g).  For foods that have more than one food mixed, such as soups and  casseroles, you must count the carbohydrates in each food that is included.  The following list contains standard serving sizes of common carbohydrate-rich foods. Each of these servings has about 15 g of carbohydrates:   hamburger bun or  English muffin.   oz (15 mL) syrup.   oz (14 g) jelly.  1 slice of bread.  1 six-inch tortilla.  3 oz (85 g) cooked rice or pasta.  4 oz (113 g) cooked dried beans.  4 oz (113 g) starchy vegetable, such as peas, corn, or potatoes.  4 oz (113 g) hot cereal.  4 oz (113 g) mashed potatoes or  of a large baked potato.  4 oz (113 g) canned or frozen fruit.  4 oz (120 mL) fruit juice.  4-6 crackers.  6 chicken nuggets.  6 oz (170 g) unsweetened dry cereal.  6 oz (170 g) plain fat-free yogurt or yogurt sweetened with artificial sweeteners.  8 oz (240 mL) milk.  8 oz (170 g) fresh fruit or one small piece of fruit.  24 oz (680 g) popped popcorn.  Example of carbohydrate counting Sample meal  3 oz (85 g) chicken breast.  6 oz (170 g) brown rice.  4 oz (113 g) corn.  8 oz (240 mL) milk.  8 oz (170 g) strawberries with sugar-free whipped topping. Carbohydrate calculation 1. Identify the foods that contain carbohydrates: ? Rice. ? Corn. ? Milk. ? Strawberries. 2. Calculate how many servings you have of each food: ? 2 servings rice. ? 1 serving corn. ? 1 serving milk. ? 1 serving strawberries. 3. Multiply each number of servings by 15 g: ? 2 servings rice x 15 g = 30 g. ? 1 serving corn x 15 g = 15 g. ? 1 serving milk x 15 g = 15 g. ? 1 serving strawberries x 15 g = 15 g. 4. Add together all of the amounts to find the total grams of carbohydrates eaten: ? 30 g + 15 g +  15 g + 15 g = 75 g of carbohydrates total. This information is not intended to replace advice given to you by your health care provider. Make sure you discuss any questions you have with your health care provider. Document Released: 09/03/2005 Document Revised: 03/23/2016 Document Reviewed: 02/15/2016 Elsevier Interactive Patient Education  2018 [employer]        IF you received an x-ray today, you will receive an invoice from [facility]. Please contact [facility] at [phone] with questions or concerns regarding your invoice.   IF you received labwork today, you will receive an invoice from [facility]. Please contact [facility] at [phone] with questions or concerns regarding your invoice.   Our billing staff will not be able to assist you with questions regarding bills from these companies.  You will be contacted with the lab results as soon as they are available. The fastest way to get your results is to activate your My Chart account. Instructions are located on the last page of this paperwork. If you have not heard from us regarding the results in 2 weeks, please contact this office.      I personally performed the services described in this documentation, which was scribed in my presence. The recorded information has been reviewed and considered for accuracy and completeness, addended by me as needed, and agree with information above.  Signed,   David Greene, MD [facility].  05/16/17 6:35 PM      

## 2017-05-17 NOTE — Telephone Encounter (Signed)
Pt states that  The metformin was not sent to the pharmacy from yesterdays visit  It shows in system that it was printed but pharmacy does not have the rx   Best number [phone]

## 2017-05-18 ENCOUNTER — Telehealth: Payer: Self-pay | Admitting: *Deleted

## 2017-05-18 NOTE — Telephone Encounter (Signed)
Phoned in refill auth for Metformin to pharmacy voicemail

## 2017-05-21 ENCOUNTER — Encounter: Payer: Self-pay | Admitting: Family Medicine

## 2017-05-21 ENCOUNTER — Ambulatory Visit (INDEPENDENT_AMBULATORY_CARE_PROVIDER_SITE_OTHER): Payer: BLUE CROSS/BLUE SHIELD | Admitting: Family Medicine

## 2017-05-21 ENCOUNTER — Ambulatory Visit (INDEPENDENT_AMBULATORY_CARE_PROVIDER_SITE_OTHER): Payer: BLUE CROSS/BLUE SHIELD

## 2017-05-21 VITALS — BP 106/75 | HR 118 | Temp 98.6°F | Resp 18 | Ht 75.0 in | Wt 255.4 lb

## 2017-05-21 DIAGNOSIS — S20211A Contusion of right front wall of thorax, initial encounter: Secondary | ICD-10-CM

## 2017-05-21 DIAGNOSIS — R0789 Other chest pain: Secondary | ICD-10-CM | POA: Diagnosis not present

## 2017-05-21 DIAGNOSIS — J9811 Atelectasis: Secondary | ICD-10-CM

## 2017-05-21 DIAGNOSIS — S299XXA Unspecified injury of thorax, initial encounter: Secondary | ICD-10-CM | POA: Diagnosis not present

## 2017-05-21 MED ORDER — HYDROCODONE-ACETAMINOPHEN 5-325 MG PO TABS: 1.0000 | ORAL_TABLET | Freq: Four times a day (QID) | ORAL | 0 refills | Status: DC | PRN

## 2017-05-21 NOTE — Patient Instructions (Addendum)
Tramadol or the hydrocodone that was prescribed today up to every 6 hours if something stronger needed for pain. See information below on chest wall pain and rib contusions.  If you have any worsening abdominal pain, difficulty breathing, or other worsening symptoms, return here or the emergency room right away as other imaging such as CAT scan may be needed for both chest and abdomen.  Return to the clinic or go to the nearest emergency room if any of your symptoms worsen or new symptoms occur.   IF you received an x-ray today, you will receive an invoice from [facility]. Please contact [facility] at [phone] with questions or concerns regarding your invoice.   IF you received labwork today, you will receive an invoice from [facility]. Please contact [facility] at [phone] with questions or concerns regarding your invoice.   Our billing staff will not be able to assist you with questions regarding bills from these companies.  You will be contacted with the lab results as soon as they are available. The fastest way to get your results is to activate your My Chart account. Instructions are located on the last page of this paperwork. If you have not heard from [place] regarding the results in 2 weeks, please contact this office.     Rib Contusion A rib contusion is a deep bruise on your rib area. Contusions are the result of a blunt trauma that causes bleeding and injury to the tissues under the skin. A rib contusion may involve bruising of the ribs and of the skin and muscles in the area. The skin overlying the contusion may turn blue, purple, or yellow. Minor injuries will give you a painless contusion, but more severe contusions may stay painful and swollen for a few weeks. What are the causes? A contusion is usually caused by a blow, trauma, or direct force to an area of the body. This often occurs while playing contact sports. What are the signs or symptoms?  Swelling  and redness of the injured area.  Discoloration of the injured area.  Tenderness and soreness of the injured area.  Pain with or without movement. How is this diagnosed? The diagnosis can be made by taking a medical history and performing a physical exam. An X-ray, CT scan, or MRI may be needed to determine if there were any associated injuries, such as broken bones (fractures) or internal injuries. How is this treated? Often, the best treatment for a rib contusion is rest. Icing or applying cold compresses to the injured area may help reduce swelling and inflammation. Deep breathing exercises may be recommended to reduce the risk of partial lung collapse and pneumonia. Over-the-counter or prescription medicines may also be recommended for pain control. Follow these instructions at home:  Apply ice to the injured area: ? Put ice in a plastic bag. ? Place a towel between your skin and the bag. ? Leave the ice on for 20 minutes, 2-3 times per day.  Take medicines only as directed by your health care provider.  Rest the injured area. Avoid strenuous activity and any activities or movements that cause pain. Be careful during activities and avoid bumping the injured area.  Perform deep-breathing exercises as directed by your health care provider.  Do not lift anything that is heavier than 5 lb (2.3 kg) until your health care provider approves.  Do not use any tobacco products, including cigarettes, chewing tobacco, or electronic cigarettes. If you need help quitting, ask your health care provider.  Contact a health care provider if:  You have increased bruising or swelling.  You have pain that is not controlled with treatment.  You have a fever. Get help right away if:  You have difficulty breathing or shortness of breath.  You develop a continual cough, or you cough up thick or bloody sputum.  You feel sick to your stomach (nauseous), you throw up (vomit), or you have abdominal  pain. This information is not intended to replace advice given to you by your health care provider. Make sure you discuss any questions you have with your health care provider. Document Released: 05/29/2001 Document Revised: 02/09/2016 Document Reviewed: 06/15/2014 Elsevier Interactive Patient Education  2018 [employer].   Chest Wall Pain Chest wall pain is pain in or around the bones and muscles of your chest. Sometimes, an injury causes this pain. Sometimes, the cause may not be known. This pain may take several weeks or longer to get better. Follow these instructions at home: Pay attention to any changes in your symptoms. Take these actions to help with your pain:  Rest as told by your health care provider.  Avoid activities that cause pain. These include any activities that use your chest muscles or your abdominal and side muscles to lift heavy items.  If directed, apply ice to the painful area: ? Put ice in a plastic bag. ? Place a towel between your skin and the bag. ? Leave the ice on for 20 minutes, 2-3 times per day.  Take over-the-counter and prescription medicines only as told by your health care provider.  Do not use tobacco products, including cigarettes, chewing tobacco, and e-cigarettes. If you need help quitting, ask your health care provider.  Keep all follow-up visits as told by your health care provider. This is important.  Contact a health care provider if:  You have a fever.  Your chest pain becomes worse.  You have new symptoms. Get help right away if:  You have nausea or vomiting.  You feel sweaty or light-headed.  You have a cough with phlegm (sputum) or you cough up blood.  You develop shortness of breath. This information is not intended to replace advice given to you by your health care provider. Make sure you discuss any questions you have with your health care provider. Document Released: 09/03/2005 Document Revised: 01/12/2016 Document  Reviewed: 11/29/2014 Elsevier Interactive Patient Education  2017 [employer].

## 2017-05-21 NOTE — Telephone Encounter (Signed)
Pt seen today

## 2017-05-21 NOTE — Progress Notes (Signed)
Subjective:  By signing my name below, I, David Crane, attest that this documentation has been prepared under the direction and in the presence of Wendie Agreste, MD Electronically Signed: Ladene Artist, ED Scribe 05/21/2017 at 10:42 AM.   Patient ID: David Crane, male    DOB: 1966/06/24, 51 y.o.   MRN: [id number]  Chief Complaint  Patient presents with  . Fall    tripped on steps; left side chest/stomach hurts  . other    has been taking Tramadol 79m for pain   HPI David HORNBACKis a 51y.o. male [facility]. H/o newly diagnosed DM with last visit 3 weeks ago. Pt states that he tripped down 2 steps and landed, on his knees but struck his right side/chest with a chair. Denies LOC, head injury. Pt describes right chest wall pain as spasms which is exacerbated with standing and taking deep breaths. Pt has been taking Tramadol 50 mg every 6 hours for pain which he states "takes the edge off". He has not taken ibuprofen or Aleve.  Denies bruising to the chest wall, sob, hemoptysis, abdominal pain, swelling in abdomen, hematuria light-headedness, dizziness.   Patient Active Problem List   Diagnosis Date Noted  . [place]  . Diabetes ([place] 05/01/2017  . Peripheral neuropathy 05/01/2017   No past medical history on file. No past surgical history on file. No Known Allergies Prior to Admission medications   Medication Sig Start Date End Date Taking? Authorizing Provider  blood glucose meter kit and supplies Dispense based on patient and insurance preference. Check twice per day. FOR ICD-9 250.00, 250.01). 04/29/17   GWendie Agreste MD  gabapentin (NEURONTIN) 300 MG capsule Take 1 capsule (300 mg total) by mouth at bedtime. 05/16/17   GWendie Agreste MD  glipiZIDE (GLUCOTROL) 5 MG tablet Take 1 tablet (5 mg total) by mouth daily before breakfast. 05/16/17   GWendie Agreste MD    metFORMIN (GLUCOPHAGE) 500 MG tablet Take 1 tablet (500 mg total) by mouth 2 (two) times daily with a meal. 05/16/17   GWendie Agreste MD   Social History   Social History  . Marital status: Married    Spouse name: N/A  . Number of children: N/A  . Years of education: N/A   Occupational History  . Not on file.   Social History Main Topics  . Smoking status: Never Smoker  . Smokeless tobacco: Never Used  . Alcohol use Yes     Comment: occ  . Drug use: No  . Sexual activity: Yes   Other Topics Concern  . Not on file   Social History Narrative  . No narrative on file   Review of Systems  Respiratory: Negative for shortness of breath.   Cardiovascular: Positive for chest pain (chest wall pain).  Gastrointestinal: Negative for abdominal pain.  Genitourinary: Negative for hematuria.  Skin: Negative for color change.  Neurological: Negative for dizziness, syncope and light-headedness.      Objective:   Physical Exam  Constitutional: He is oriented to person, place, and time. He appears well-developed and well-nourished.  HENT:  Head: Normocephalic and atraumatic.  Eyes: Pupils are equal, round, and reactive to light. EOM are normal.  Neck: No JVD present. Carotid bruit is not present.  Cardiovascular: Regular rhythm and normal heart sounds.  Tachycardia present.   No murmur heard. Pulmonary/Chest: Effort normal and  breath sounds normal. He has no rales.  Lungs clear with equal breath sounds bilaterally.  Chest wall: tender at the mid axillary line, R lower ribs, but lowest rib is nontender. Anterior ribs nontender. Skin is intact, no bruising. Negative Cullen. Negative Flank-turner.   Abdominal: There is no rebound and no guarding.  Minimal discomfort in RUQ.  Musculoskeletal: He exhibits no edema.  Neurological: He is alert and oriented to person, place, and time.  Skin: Skin is warm and dry.  Psychiatric: He has a normal mood and affect.  Vitals  reviewed.  Vitals:   05/21/17 1032  BP: 106/75  Pulse: (!) 118  Resp: 18  Temp: 98.6 F (37 C)  TempSrc: Oral  SpO2: 97%  Weight: 255 lb 6.4 oz (115.8 kg)  Height: 6' 3" (1.905 m)   Dg Ribs Unilateral W/chest Right  Result Date: 05/21/2017 CLINICAL DATA:  Right-sided chest wall pain after fall 3 days ago. EXAM: RIGHT RIBS AND CHEST - 3+ VIEW COMPARISON:  None. FINDINGS: No fracture or other bone lesions are seen involving the ribs. There is no evidence of pneumothorax or pleural effusion. Both lungs are clear except for minimal linear scarring or atelectasis bilaterally. Heart size and mediastinal contours are within normal limits. IMPRESSION: No rib fractures or other significant abnormalities. Minimal linear scarring or atelectasis. Electronically Signed   By: Lorriane Shire M.D.   On: 05/21/2017 11:10      Assessment & Plan:    David Crane is a 51 y.o. male Right-sided chest wall pain - Plan: DG Ribs Unilateral W/Chest Right, HYDROcodone-acetaminophen (NORCO/VICODIN) 5-325 MG tablet  Atelectasis  Rib contusion, right, initial encounter  Rib contusion/chest wall pain without apparent fracture on x-ray. Slight bilateral atelectasis likely from decreased inspiration, but no pneumothorax. Initial slight discomfort right upper quadrant, on repeat testing was nontender.   - symptom Medicare discussed with hydrocodone or tramadol. Perception for hydrocodone provider. Discussed deep breathing throughout the day to lessen atelectasis. If any abdominal pain, or worsening symptoms, would consider CT chest/abdomen. RTC/ER precautions discussed..   Meds ordered this encounter  Medications  . traMADol (ULTRAM) 50 MG tablet    Sig: Take by mouth every 6 (six) hours as needed.  Marland Kitchen HYDROcodone-acetaminophen (NORCO/VICODIN) 5-325 MG tablet    Sig: Take 1 tablet by mouth every 6 (six) hours as needed for moderate pain.    Dispense:  20 tablet    Refill:  0   Patient Instructions     Tramadol or the hydrocodone that was prescribed today up to every 6 hours if something stronger needed for pain. See information below on chest wall pain and rib contusions.  If you have any worsening abdominal pain, difficulty breathing, or other worsening symptoms, return here or the emergency room right away as other imaging such as CAT scan may be needed for both chest and abdomen.  Return to the clinic or go to the nearest emergency room if any of your symptoms worsen or new symptoms occur.   IF you received an x-ray today, you will receive an invoice from [facility]. Please contact [facility] at [phone] with questions or concerns regarding your invoice.   IF you received labwork today, you will receive an invoice from [place]. Please contact [facility] at [phone] with questions or concerns regarding your invoice.   Our billing staff will not be able to assist you with questions regarding bills from these companies.  You will be contacted with the lab results as soon  as they are available. The fastest way to get your results is to activate your My Chart account. Instructions are located on the last page of this paperwork. If you have not heard from [place] regarding the results in 2 weeks, please contact this office.     Rib Contusion A rib contusion is a deep bruise on your rib area. Contusions are the result of a blunt trauma that causes bleeding and injury to the tissues under the skin. A rib contusion may involve bruising of the ribs and of the skin and muscles in the area. The skin overlying the contusion may turn blue, purple, or yellow. Minor injuries will give you a painless contusion, but more severe contusions may stay painful and swollen for a few weeks. What are the causes? A contusion is usually caused by a blow, trauma, or direct force to an area of the body. This often occurs while playing contact sports. What are the signs or  symptoms?  Swelling and redness of the injured area.  Discoloration of the injured area.  Tenderness and soreness of the injured area.  Pain with or without movement. How is this diagnosed? The diagnosis can be made by taking a medical history and performing a physical exam. An X-ray, CT scan, or MRI may be needed to determine if there were any associated injuries, such as broken bones (fractures) or internal injuries. How is this treated? Often, the best treatment for a rib contusion is rest. Icing or applying cold compresses to the injured area may help reduce swelling and inflammation. Deep breathing exercises may be recommended to reduce the risk of partial lung collapse and pneumonia. Over-the-counter or prescription medicines may also be recommended for pain control. Follow these instructions at home:  Apply ice to the injured area: ? Put ice in a plastic bag. ? Place a towel between your skin and the bag. ? Leave the ice on for 20 minutes, 2-3 times per day.  Take medicines only as directed by your health care provider.  Rest the injured area. Avoid strenuous activity and any activities or movements that cause pain. Be careful during activities and avoid bumping the injured area.  Perform deep-breathing exercises as directed by your health care provider.  Do not lift anything that is heavier than 5 lb (2.3 kg) until your health care provider approves.  Do not use any tobacco products, including cigarettes, chewing tobacco, or electronic cigarettes. If you need help quitting, ask your health care provider. Contact a health care provider if:  You have increased bruising or swelling.  You have pain that is not controlled with treatment.  You have a fever. Get help right away if:  You have difficulty breathing or shortness of breath.  You develop a continual cough, or you cough up thick or bloody sputum.  You feel sick to your stomach (nauseous), you throw up (vomit), or  you have abdominal pain. This information is not intended to replace advice given to you by your health care provider. Make sure you discuss any questions you have with your health care provider. Document Released: 05/29/2001 Document Revised: 02/09/2016 Document Reviewed: 06/15/2014 Elsevier Interactive Patient Education  2018 [employer].   Chest Wall Pain Chest wall pain is pain in or around the bones and muscles of your chest. Sometimes, an injury causes this pain. Sometimes, the cause may not be known. This pain may take several weeks or longer to get better. Follow these instructions at home: Pay attention to any  changes in your symptoms. Take these actions to help with your pain:  Rest as told by your health care provider.  Avoid activities that cause pain. These include any activities that use your chest muscles or your abdominal and side muscles to lift heavy items.  If directed, apply ice to the painful area: ? Put ice in a plastic bag. ? Place a towel between your skin and the bag. ? Leave the ice on for 20 minutes, 2-3 times per day.  Take over-the-counter and prescription medicines only as told by your health care provider.  Do not use tobacco products, including cigarettes, chewing tobacco, and e-cigarettes. If you need help quitting, ask your health care provider.  Keep all follow-up visits as told by your health care provider. This is important.  Contact a health care provider if:  You have a fever.  Your chest pain becomes worse.  You have new symptoms. Get help right away if:  You have nausea or vomiting.  You feel sweaty or light-headed.  You have a cough with phlegm (sputum) or you cough up blood.  You develop shortness of breath. This information is not intended to replace advice given to you by your health care provider. Make sure you discuss any questions you have with your health care provider. Document Released: 09/03/2005 Document Revised:  01/12/2016 Document Reviewed: 11/29/2014 Elsevier Interactive Patient Education  [employer].   I personally performed the services described in this documentation, which was scribed in my presence. The recorded information has been reviewed and considered for accuracy and completeness, addended by me as needed, and agree with information above.  Signed,   [name], MD Primary [facility] at [place].  05/21/17 11:49 AM

## 2017-06-18 ENCOUNTER — Ambulatory Visit: Payer: BLUE CROSS/BLUE SHIELD | Admitting: Family Medicine

## 2017-06-24 ENCOUNTER — Encounter: Payer: Self-pay | Admitting: Family Medicine

## 2017-06-24 ENCOUNTER — Ambulatory Visit (INDEPENDENT_AMBULATORY_CARE_PROVIDER_SITE_OTHER): Payer: BLUE CROSS/BLUE SHIELD | Admitting: Family Medicine

## 2017-06-24 VITALS — BP 110/72 | HR 98 | Temp 98.2°F | Resp 16 | Ht 75.0 in | Wt 259.6 lb

## 2017-06-24 DIAGNOSIS — R7989 Other specified abnormal findings of blood chemistry: Secondary | ICD-10-CM | POA: Diagnosis not present

## 2017-06-24 DIAGNOSIS — R0789 Other chest pain: Secondary | ICD-10-CM

## 2017-06-24 DIAGNOSIS — E1140 Type 2 diabetes mellitus with diabetic neuropathy, unspecified: Secondary | ICD-10-CM

## 2017-06-24 NOTE — Patient Instructions (Addendum)
Try dose of gabapentin at night to see if that helps with burning pain in feet. No change in metformin or glipizide for now. Recheck in one month with blood work at that time. See information below on eating outside of the house as well as other healthy food options for diabetes. Try to incorporate 1 new vegetable every few weeks.   Tips for Eating Away From Home If You Have Diabetes Controlling your level of blood glucose, also known as blood sugar, can be challenging. It can be even more difficult when you do not prepare your own meals. The following tips can help you manage your diabetes when you eat away from home. Planning ahead Plan ahead if you know you will be eating away from home:  Ask your health care provider how to time meals and medicine if you are taking insulin.  Make a list of restaurants near you that offer healthy choices. If they have a carry-out menu, take it home and plan what you will order ahead of time.  Look up the restaurant you want to eat at online. Many chain and fast-food restaurants list nutritional information online. Use this information to choose the healthiest options and to calculate how many carbohydrates will be in your meal.  Use a carbohydrate-counting book or mobile app to look up the carbohydrate content and serving size of the foods you want to eat.  Become familiar with serving sizes and learn to recognize how many servings are in a portion. This will allow you to estimate how many carbohydrates you can eat.  Free foods A "free food" is any food or drink that has less than 5 g of carbohydrates per serving. Free foods include:  Many vegetables.  Hard boiled eggs.  Nuts or seeds.  Olives.  Cheeses.  Meats.  These types of foods make good appetizer choices and are often available at salad bars. Lemon juice, vinegar, or a low-calorie salad dressing of fewer than 20 calories per serving can be used as a "free" salad dressing. Choices to  reduce carbohydrates  Substitute nonfat sweetened yogurt with a sugar-free yogurt. Yogurt made from soy milk may also be used, but you will still want a sugar-free or plain option to choose a lower carbohydrate amount.  Ask your server to take away the bread basket or chips from your table.  Order fresh fruit. A salad bar often offers fresh fruit choices. Avoid canned fruit because it is usually packed in sugar or syrup.  Order a salad, and eat it without dressing. Or, create a "free" salad dressing.  Ask for substitutions. For example, instead of [place] fries, request an order of a vegetable such as salad, green beans, or broccoli. Other tips  If you take insulin, take the insulin once your food arrives to your table. This will ensure your insulin and food are timed correctly.  Ask your server about the portion size before your order, and ask for a take-out box if the portion has more servings than you should have. When your food comes, leave the amount you should have on the plate, and put the rest in the take-out box.  Consider splitting an entree with someone and ordering a side salad. This information is not intended to replace advice given to you by your health care provider. Make sure you discuss any questions you have with your health care provider. Document Released: 09/03/2005 Document Revised: 02/09/2016 Document Reviewed: 12/01/2013 Elsevier Interactive Patient Education  [employer].  Diabetes Mellitus and Food It is important for you to manage your blood sugar (glucose) level. Your blood glucose level can be greatly affected by what you eat. Eating healthier foods in the appropriate amounts throughout the day at about the same time each day will help you control your blood glucose level. It can also help slow or prevent worsening of your diabetes mellitus. Healthy eating may even help you improve the level of your blood pressure and reach or maintain a healthy  weight. General recommendations for healthful eating and cooking habits include:  Eating meals and snacks regularly. Avoid going long periods of time without eating to lose weight.  Eating a diet that consists mainly of plant-based foods, such as fruits, vegetables, nuts, legumes, and whole grains.  Using low-heat cooking methods, such as baking, instead of high-heat cooking methods, such as deep frying.  Work with your dietitian to make sure you understand how to use the Nutrition Facts information on food labels. How can food affect me? Carbohydrates Carbohydrates affect your blood glucose level more than any other type of food. Your dietitian will help you determine how many carbohydrates to eat at each meal and teach you how to count carbohydrates. Counting carbohydrates is important to keep your blood glucose at a healthy level, especially if you are using insulin or taking certain medicines for diabetes mellitus. Alcohol Alcohol can cause sudden decreases in blood glucose (hypoglycemia), especially if you use insulin or take certain medicines for diabetes mellitus. Hypoglycemia can be a life-threatening condition. Symptoms of hypoglycemia (sleepiness, dizziness, and disorientation) are similar to symptoms of having too much alcohol. If your health care provider has given you approval to drink alcohol, do so in moderation and use the following guidelines:  Women should not have more than one drink per day, and men should not have more than two drinks per day. One drink is equal to: ? 12 oz of beer. ? 5 oz of wine. ? 1 oz of hard liquor.  Do not drink on an empty stomach.  Keep yourself hydrated. Have water, diet soda, or unsweetened iced tea.  Regular soda, juice, and other mixers might contain a lot of carbohydrates and should be counted.  What foods are not recommended? As you make food choices, it is important to remember that all foods are not the same. Some foods have fewer  nutrients per serving than other foods, even though they might have the same number of calories or carbohydrates. It is difficult to get your body what it needs when you eat foods with fewer nutrients. Examples of foods that you should avoid that are high in calories and carbohydrates but low in nutrients include:  Trans fats (most processed foods list trans fats on the Nutrition Facts label).  Regular soda.  Juice.  Candy.  Sweets, such as cake, pie, doughnuts, and cookies.  Fried foods.  What foods can I eat? Eat nutrient-rich foods, which will nourish your body and keep you healthy. The food you should eat also will depend on several factors, including:  The calories you need.  The medicines you take.  Your weight.  Your blood glucose level.  Your blood pressure level.  Your cholesterol level.  You should eat a variety of foods, including:  Protein. ? Lean cuts of meat. ? Proteins low in saturated fats, such as fish, egg whites, and beans. Avoid processed meats.  Fruits and vegetables. ? Fruits and vegetables that may help control blood glucose levels, such as  apples, mangoes, and yams.  Dairy products. ? Choose fat-free or low-fat dairy products, such as milk, yogurt, and cheese.  Grains, bread, pasta, and rice. ? Choose whole grain products, such as multigrain bread, whole oats, and brown rice. These foods may help control blood pressure.  Fats. ? Foods containing healthful fats, such as nuts, avocado, olive oil, canola oil, and fish.  Does everyone with diabetes mellitus have the same meal plan? Because every person with diabetes mellitus is different, there is not one meal plan that works for everyone. It is very important that you meet with a dietitian who will help you create a meal plan that is just right for you. This information is not intended to replace advice given to you by your health care provider. Make sure you discuss any questions you have with  your health care provider. Document Released: 05/31/2005 Document Revised: 02/09/2016 Document Reviewed: 07/31/2013 Elsevier Interactive Patient Education  2017 [employer].    IF you received an x-ray today, you will receive an invoice from [facility]. Please contact [facility] at [phone] with questions or concerns regarding your invoice.   IF you received labwork today, you will receive an invoice from [place]. Please contact [facility] at [phone] with questions or concerns regarding your invoice.   Our billing staff will not be able to assist you with questions regarding bills from these companies.  You will be contacted with the lab results as soon as they are available. The fastest way to get your results is to activate your My Chart account. Instructions are located on the last page of this paperwork. If you have not heard from [place] regarding the results in 2 weeks, please contact this office.

## 2017-06-24 NOTE — Progress Notes (Signed)
Subjective:  By signing my name below, I, [name], attest that this documentation has been prepared under the direction and in the presence of [name], MD. Electronically Signed: [name], [place]. 06/24/2017 , 5:58 PM .  Patient was seen in Room 11 .   Patient ID: David Crane, male    DOB: [birth year], 51 y.o.   MRN: [id number] Chief Complaint  Patient presents with  . Follow-up    better not  great but better    HPI David MAZZUCA is a 51 y.o. male Here for follow up.   Diabetes He has a history of DM, new diagnosis on Aug 3rd when he was being seen for left cervical radiculopathy. He was initially started on metformin 558m QD and baseline A1C of 12.5. Increased his metformin to BID on Aug 13th. Also started glipizide 528mQD.   His diabetes is complicated by nephropathy with creatinine up to 1.45, and eGFR of 55, as well as suspected peripheral neuropathy. His creatinine did improve to 1.30 on Aug 13th. He was started on Gabapentin 30048mHS for neuropathy in August.   Patient states his sugars have been running usually in 120-145, usually checks 2 hours after meals. In the mornings, he would check around 140-145. He usually eat lunch at noon at around 12:30PM, and would come home at 5:00PM to check his sugars in the 120s. During night time, his blood sugar would be lower; lowest at 107 and felt fine. He usually takes glipizide at dinner. His lowest home sugar reading was at 76 and felt weird; the night before, he went out to eat ribs but without carbs.   His cramps have improved with the Gabapentin, but notes still having burning pain in his feet. He hasn't tried taking an extra dose.   Exercise: He walks a lot through out the day, as he works as a salBiochemist, clinical Right sided chest wall pain He had right sided chest wall pain after fall on Sept 1st. He was seen on Sept 4th. He had tripped and landed on the right side of his chest onto a chair. He was initially  treated with tramadol. His xray at Sept 4th visit was without fracture. A short term hydrocodone prescription was provided.   He states he's doing better. He denies any fever or cough.   Patient Active Problem List   Diagnosis Date Noted  . [facility]  . Diabetes ([facility]  . Peripheral neuropathy 05/01/2017   History reviewed. No pertinent past medical history. History reviewed. No pertinent surgical history. No Known Allergies Prior to Admission medications   Medication Sig Start Date End Date Taking? Authorizing Provider  blood glucose meter kit and supplies Dispense based on patient and insurance preference. Check twice per day. FOR ICD-9 250.00, 250.01). 04/29/17   GreWendie AgresteD  gabapentin (NEURONTIN) 300 MG capsule Take 1 capsule (300 mg total) by mouth at bedtime. 05/16/17   GreWendie AgresteD  glipiZIDE (GLUCOTROL) 5 MG tablet Take 1 tablet (5 mg total) by mouth daily before breakfast. 05/16/17   GreWendie AgresteD  HYDROcodone-acetaminophen (NORCO/VICODIN) 5-325 MG tablet Take 1 tablet by mouth every 6 (six) hours as needed for moderate pain. 05/21/17   GreWendie AgresteD  metFORMIN (GLUCOPHAGE) 500 MG tablet Take 1 tablet (500 mg total) by mouth 2 (two) times daily with a meal. 05/16/17   GreWendie AgresteD  traMADol (ULTRAM) 50 MG tablet  Take by mouth every 6 (six) hours as needed.    [provider]   Social History   Social History  . Marital status: Married    Spouse name: N/A  . Number of children: N/A  . Years of education: N/A   Occupational History  . Not on file.   Social History Main Topics  . Smoking status: Never Smoker  . Smokeless tobacco: Never Used  . Alcohol use Yes     Comment: occ  . Drug use: No  . Sexual activity: Yes   Other Topics Concern  . Not on file   Social History Narrative  . No narrative on file   Review of Systems  Constitutional: Negative for fatigue and unexpected  weight change.  Eyes: Negative for visual disturbance.  Respiratory: Negative for cough, chest tightness and shortness of breath.   Cardiovascular: Negative for chest pain, palpitations and leg swelling.  Gastrointestinal: Negative for abdominal pain and blood in stool.  Neurological: Negative for dizziness, light-headedness and headaches.       Objective:   Physical Exam  Constitutional: He is oriented to person, place, and time. He appears well-developed and well-nourished.  HENT:  Head: Normocephalic and atraumatic.  Eyes: Pupils are equal, round, and reactive to light. EOM are normal.  Neck: No JVD present. Carotid bruit is not present.  Cardiovascular: Normal rate, regular rhythm and normal heart sounds.  Exam reveals no gallop and no friction rub.   No murmur heard. Pulmonary/Chest: Effort normal and breath sounds normal. No respiratory distress. He has no wheezes. He has no rales.  Musculoskeletal: He exhibits no edema.  Neurological: He is alert and oriented to person, place, and time.  Skin: Skin is warm and dry.  Psychiatric: He has a normal mood and affect.  Vitals reviewed.   Vitals:   06/24/17 1716  BP: 110/72  Pulse: 98  Resp: 16  Temp: 98.2 F (36.8 C)  SpO2: 98%  Weight: 259 lb 9.6 oz (117.8 kg)  Height: '6\' 3"'  (1.905 m)      Assessment & Plan:  [name] is a 51 y.o. male Type 2 diabetes mellitus with diabetic neuropathy, without long-term current use of insulin ([place])  - Improving, continue same dose of metformin and glipizide. Discussed healthier eating options and handout given to help with diet. Previously looked at nutritionist evaluation but that was cost prohibitive. Plan on repeat A1c, lipids in approximately one month.  - Discussed increased dose of gabapentin at bedtime to see if that helps with neuropathic pain. If he has side effects at that dose, can add in slowly 100 mg doses.  Elevated serum creatinine  - Likely component of chronic  kidney disease with diabetes. Improved last visit. Plan on recheck creatinine at next visit with blood work.  Chest wall pain  -Improved, suspected chest wall contusion. No further workup at this time.  No orders of the defined types were placed in this encounter.  Patient Instructions    Try dose of gabapentin at night to see if that helps with burning pain in feet. No change in metformin or glipizide for now. Recheck in one month with blood work at that time. See information below on eating outside of the house as well as other healthy food options for diabetes. Try to incorporate 1 new vegetable every few weeks.   Tips for Eating Away From Home If You Have Diabetes Controlling your level of blood glucose, also known as blood sugar, can  be challenging. It can be even more difficult when you do not prepare your own meals. The following tips can help you manage your diabetes when you eat away from home. Planning ahead Plan ahead if you know you will be eating away from home:  Ask your health care provider how to time meals and medicine if you are taking insulin.  Make a list of restaurants near you that offer healthy choices. If they have a carry-out menu, take it home and plan what you will order ahead of time.  Look up the restaurant you want to eat at online. Many chain and fast-food restaurants list nutritional information online. Use this information to choose the healthiest options and to calculate how many carbohydrates will be in your meal.  Use a carbohydrate-counting book or mobile app to look up the carbohydrate content and serving size of the foods you want to eat.  Become familiar with serving sizes and learn to recognize how many servings are in a portion. This will allow you to estimate how many carbohydrates you can eat.  Free foods A "free food" is any food or drink that has less than 5 g of carbohydrates per serving. Free foods include:  Many vegetables.  Hard  boiled eggs.  Nuts or seeds.  Olives.  Cheeses.  Meats.  These types of foods make good appetizer choices and are often available at salad bars. Lemon juice, vinegar, or a low-calorie salad dressing of fewer than 20 calories per serving can be used as a "free" salad dressing. Choices to reduce carbohydrates  Substitute nonfat sweetened yogurt with a sugar-free yogurt. Yogurt made from soy milk may also be used, but you will still want a sugar-free or plain option to choose a lower carbohydrate amount.  Ask your server to take away the bread basket or chips from your table.  Order fresh fruit. A salad bar often offers fresh fruit choices. Avoid canned fruit because it is usually packed in sugar or syrup.  Order a salad, and eat it without dressing. Or, create a "free" salad dressing.  Ask for substitutions. For example, instead of [place] fries, request an order of a vegetable such as salad, green beans, or broccoli. Other tips  If you take insulin, take the insulin once your food arrives to your table. This will ensure your insulin and food are timed correctly.  Ask your server about the portion size before your order, and ask for a take-out box if the portion has more servings than you should have. When your food comes, leave the amount you should have on the plate, and put the rest in the take-out box.  Consider splitting an entree with someone and ordering a side salad. This information is not intended to replace advice given to you by your health care provider. Make sure you discuss any questions you have with your health care provider. Document Released: 09/03/2005 Document Revised: 02/09/2016 Document Reviewed: 12/01/2013 Elsevier Interactive Patient Education  2018 [employer].   Diabetes Mellitus and Food It is important for you to manage your blood sugar (glucose) level. Your blood glucose level can be greatly affected by what you eat. Eating healthier foods in the  appropriate amounts throughout the day at about the same time each day will help you control your blood glucose level. It can also help slow or prevent worsening of your diabetes mellitus. Healthy eating may even help you improve the level of your blood pressure and reach or maintain a healthy  weight. General recommendations for healthful eating and cooking habits include:  Eating meals and snacks regularly. Avoid going long periods of time without eating to lose weight.  Eating a diet that consists mainly of plant-based foods, such as fruits, vegetables, nuts, legumes, and whole grains.  Using low-heat cooking methods, such as baking, instead of high-heat cooking methods, such as deep frying.  Work with your dietitian to make sure you understand how to use the Nutrition Facts information on food labels. How can food affect me? Carbohydrates Carbohydrates affect your blood glucose level more than any other type of food. Your dietitian will help you determine how many carbohydrates to eat at each meal and teach you how to count carbohydrates. Counting carbohydrates is important to keep your blood glucose at a healthy level, especially if you are using insulin or taking certain medicines for diabetes mellitus. Alcohol Alcohol can cause sudden decreases in blood glucose (hypoglycemia), especially if you use insulin or take certain medicines for diabetes mellitus. Hypoglycemia can be a life-threatening condition. Symptoms of hypoglycemia (sleepiness, dizziness, and disorientation) are similar to symptoms of having too much alcohol. If your health care provider has given you approval to drink alcohol, do so in moderation and use the following guidelines:  Women should not have more than one drink per day, and men should not have more than two drinks per day. One drink is equal to: ? 12 oz of beer. ? 5 oz of wine. ? 1 oz of hard liquor.  Do not drink on an empty stomach.  Keep yourself hydrated.  Have water, diet soda, or unsweetened iced tea.  Regular soda, juice, and other mixers might contain a lot of carbohydrates and should be counted.  What foods are not recommended? As you make food choices, it is important to remember that all foods are not the same. Some foods have fewer nutrients per serving than other foods, even though they might have the same number of calories or carbohydrates. It is difficult to get your body what it needs when you eat foods with fewer nutrients. Examples of foods that you should avoid that are high in calories and carbohydrates but low in nutrients include:  Trans fats (most processed foods list trans fats on the Nutrition Facts label).  Regular soda.  Juice.  Candy.  Sweets, such as cake, pie, doughnuts, and cookies.  Fried foods.  What foods can I eat? Eat nutrient-rich foods, which will nourish your body and keep you healthy. The food you should eat also will depend on several factors, including:  The calories you need.  The medicines you take.  Your weight.  Your blood glucose level.  Your blood pressure level.  Your cholesterol level.  You should eat a variety of foods, including:  Protein. ? Lean cuts of meat. ? Proteins low in saturated fats, such as fish, egg whites, and beans. Avoid processed meats.  Fruits and vegetables. ? Fruits and vegetables that may help control blood glucose levels, such as apples, mangoes, and yams.  Dairy products. ? Choose fat-free or low-fat dairy products, such as milk, yogurt, and cheese.  Grains, bread, pasta, and rice. ? Choose whole grain products, such as multigrain bread, whole oats, and brown rice. These foods may help control blood pressure.  Fats. ? Foods containing healthful fats, such as nuts, avocado, olive oil, canola oil, and fish.  Does everyone with diabetes mellitus have the same meal plan? Because every person with diabetes mellitus is different, there is  not one meal  plan that works for everyone. It is very important that you meet with a dietitian who will help you create a meal plan that is just right for you. This information is not intended to replace advice given to you by your health care provider. Make sure you discuss any questions you have with your health care provider. Document Released: 05/31/2005 Document Revised: 02/09/2016 Document Reviewed: 07/31/2013 Elsevier Interactive Patient Education  2017 [employer].    IF you received an x-ray today, you will receive an invoice from [facility]. Please contact [facility] at [phone] with questions or concerns regarding your invoice.   IF you received labwork today, you will receive an invoice from [facility]. Please contact [facility] at [phone] with questions or concerns regarding your invoice.   Our billing staff will not be able to assist you with questions regarding bills from these companies.  You will be contacted with the lab results as soon as they are available. The fastest way to get your results is to activate your My Chart account. Instructions are located on the last page of this paperwork. If you have not heard from [place] regarding the results in 2 weeks, please contact this office.       I personally performed the services described in this documentation, which was scribed in my presence. The recorded information has been reviewed and considered for accuracy and completeness, addended by me as needed, and agree with information above.  Signed,   [name], MD [facility].  06/28/17 6:18 PM

## 2017-07-25 ENCOUNTER — Encounter: Payer: Self-pay | Admitting: Family Medicine

## 2017-07-25 ENCOUNTER — Ambulatory Visit: Payer: BLUE CROSS/BLUE SHIELD | Admitting: Family Medicine

## 2017-07-25 VITALS — BP 118/78 | HR 90 | Temp 98.5°F | Resp 16 | Ht 75.0 in | Wt 262.2 lb

## 2017-07-25 DIAGNOSIS — R252 Cramp and spasm: Secondary | ICD-10-CM

## 2017-07-25 DIAGNOSIS — E1129 Type 2 diabetes mellitus with other diabetic kidney complication: Secondary | ICD-10-CM | POA: Diagnosis not present

## 2017-07-25 DIAGNOSIS — Z13220 Encounter for screening for lipoid disorders: Secondary | ICD-10-CM

## 2017-07-25 DIAGNOSIS — G629 Polyneuropathy, unspecified: Secondary | ICD-10-CM | POA: Diagnosis not present

## 2017-07-25 DIAGNOSIS — M7989 Other specified soft tissue disorders: Secondary | ICD-10-CM

## 2017-07-25 DIAGNOSIS — R7989 Other specified abnormal findings of blood chemistry: Secondary | ICD-10-CM | POA: Diagnosis not present

## 2017-07-25 DIAGNOSIS — R809 Proteinuria, unspecified: Secondary | ICD-10-CM

## 2017-07-25 MED ORDER — GABAPENTIN 300 MG PO CAPS: 300.0000 mg | ORAL_CAPSULE | Freq: Three times a day (TID) | ORAL | 3 refills | Status: DC | PRN

## 2017-07-25 NOTE — Progress Notes (Signed)
Subjective:  By signing my name below, I, David Crane, attest that this documentation has been prepared under the direction and in the presence of [name], MD. Electronically Signed: [name], Bailey. 07/25/2017 , 5:40 PM .  Patient was seen in Room 10 .   Patient ID: David Crane, male    DOB: [birth year], 51 y.o.   MRN: [id number] Chief Complaint  Patient presents with  . Follow-up    follow up on DM, patient would like to discuss change in gabapentin   HPI David Crane is a 51 y.o. male Here for follow up.   Diabetes with diabetic neuropathy and microalbuminuria Lab Results  Component Value Date   HGBA1C 12.5 (H) 04/19/2017   He had a new diagnosis of diabetes on Aug 3rd. He's been progressed from initial metformin 593m QD to 5060mBID and glipizide 33m6mD.   Patient states his sugar has been about the same. He checked his sugar 4 days ago, running about 120. He's noticed if he eats more carbs than usual, his sugar will be elevated about 10-15 points. He reports yesterday morning, his sugar was 159. He's still taking metformin 500m22mD and glipizide 33mg 1033mbefore his biggest meal. With his diet, he's been watching his carb intake. He denies abdominal pain or diarrhea with his medication.   Peripheral neuropathy He was started on gabapentin 300mg 41mfor peripheral neuropathy with leg cramp symptoms. He was continued on same doses on Oct 8th visit with discussion of diet and handout given. We discussed gabapentin for neuropathic pain at night with option of 600mg d73mg.   Patient states he's still feeling tingling with some pain in his feet. He denies burning and tingling throughout the night. He denies feeling sleepy with taking the gabapentin.   Knot in right calf Patient also reports feeling a solid knot in the back of his right calf noticed about 2 weeks ago. He denies having any pain in his calves. He does drive long distances for work. He has some leg  swelling after work, which improves in the morning. He denies chest pain, chest tightness, shortness of breath, lightheadedness, dizziness or Crane in stool.   Patient Active Problem List   Diagnosis Date Noted  . Diabetic nephropathy (HCC) 08[place]/2018  . Diabetes (HCC) 08[place]/2018  . Peripheral neuropathy 05/01/2017   No past medical history on file. No past surgical history on file. No Known Allergies Prior to Admission medications   Medication Sig Start Date End Date Taking? Authorizing Provider  Crane glucose meter kit and supplies Dispense based on patient and insurance preference. Check twice per day. FOR ICD-9 250.00, 250.01). 04/29/17  Yes Celicia Minahan,Wendie Agresteabapentin (NEURONTIN) 300 MG capsule Take 1 capsule (300 mg total) by mouth at bedtime. 05/16/17  Yes Raymond Bhardwaj,Wendie AgrestelipiZIDE (GLUCOTROL) 5 MG tablet Take 1 tablet (5 mg total) by mouth daily before breakfast. 05/16/17  Yes Raymundo Rout,Wendie AgresteetFORMIN (GLUCOPHAGE) 500 MG tablet Take 1 tablet (500 mg total) by mouth 2 (two) times daily with a meal. 05/16/17  Yes Adiel Erney,Wendie AgresteYDROcodone-acetaminophen (NORCO/VICODIN) 5-325 MG tablet Take 1 tablet by mouth every 6 (six) hours as needed for moderate pain. Patient not taking: Reported on 07/25/2017 05/21/17   Adnan Vanvoorhis,Wendie AgresteraMADol (ULTRAMVeatrice [place]bon tablet Take by mouth every 6 (six) hours as needed.    [provider]   Social History   Socioeconomic  History  . Marital status: Married    Spouse name: Not on file  . Number of children: Not on file  . Years of education: Not on file  . Highest education level: Not on file  Social Needs  . Financial resource strain: Not on file  . Food insecurity - worry: Not on file  . Food insecurity - inability: Not on file  . Transportation needs - medical: Not on file  . Transportation needs - non-medical: Not on file  Occupational History  . Not on file  Tobacco Use  . Smoking status: Never Smoker    . Smokeless tobacco: Never Used  Substance and Sexual Activity  . Alcohol use: Yes    Comment: occ  . Drug use: No  . Sexual activity: Yes  Other Topics Concern  . Not on file  Social History Narrative  . Not on file   Review of Systems  Constitutional: Negative for fatigue and unexpected weight change.  Eyes: Negative for visual disturbance.  Respiratory: Negative for cough, chest tightness and shortness of breath.   Cardiovascular: Negative for chest pain, palpitations and leg swelling.  Gastrointestinal: Negative for abdominal pain, Crane in stool, diarrhea, nausea and vomiting.  Skin: Negative for rash and wound.  Neurological: Negative for dizziness, light-headedness and headaches.       Objective:   Physical Exam  Constitutional: He is oriented to person, place, and time. He appears well-developed and well-nourished.  HENT:  Head: Normocephalic and atraumatic.  Eyes: EOM are normal. Pupils are equal, round, and reactive to light.  Neck: No JVD present. Carotid bruit is not present.  Cardiovascular: Normal rate, regular rhythm and normal heart sounds.  No murmur heard. Pulmonary/Chest: Effort normal and breath sounds normal. He has no rales.  Musculoskeletal: He exhibits no edema.  Bilateral foot: calluses on the base of his feet bilaterally, but no wounds; general sensation intact bilaterally across the distal toes Few varicose veins in his lower legs; negative homan's Calf circumference, 15cm below patellar: Right: 47cm  Left: 46cm   Neurological: He is alert and oriented to person, place, and time.  Skin: Skin is warm and dry.  Psychiatric: He has a normal mood and affect.  Vitals reviewed.   Vitals:   07/25/17 1649  BP: 118/78  Pulse: 90  Resp: 16  Temp: 98.5 F (36.9 C)  TempSrc: Oral  SpO2: 99%  Weight: 262 lb 3.2 oz (118.9 kg)  Height: _0  (1.905 m)      Assessment & Plan:   TRUNG WENZL is a 51 y.o. male Calf swelling - Plan: D-dimer,  quantitative (not at [facility]) Leg cramps - Plan: gabapentin (NEURONTIN) 300 MG capsule, D-dimer, quantitative (not at [facility]), Comprehensive metabolic panel  - Episodic cramps in right leg with reported intermittent swelling. He does drive long distances, no known history of DVT or other DVT risk factors. Varicose veins noted.  -Check d-dimer, if elevated proceed with ultrasound of right lower extremity.   Type 2 diabetes mellitus with microalbuminuria, without long-term current use of insulin (HCC) - Plan: Hemoglobin A1c  - Improved home readings, tolerating metformin and glipizide at current dose. Check A1c to determine next change.  Peripheral polyneuropathy - Plan: gabapentin (NEURONTIN) 300 MG capsule  - -Muscle cramps have improved with gabapentin but some persistent foot dysesthesias. Increase gabapentin to 300 mg in the morning, remain at 600 mg daily at bedtime. If needed he can take additional 300 mg dose in the afternoon. Cautioned on  sedation, other side effects. Lower dose if needed to 100 mg during the day.    Screening for hyperlipidemia - Plan: Lipid panel  Elevated serum creatinine - Plan: Comprehensive metabolic panel  - Suspect a component of chronic kidney disease with diabetes. Improved creatinine last visit, repeat testing. Metformin and dosing may need to be adjusted based on creatinine.  Meds ordered this encounter  Medications  . gabapentin (NEURONTIN) 300 MG capsule    Sig: Take 1-2 capsules (300-600 mg total) 3 (three) times daily as needed by mouth. Start with 370m QAM, continue 607mQHS. Add afternoon dose of 30053mn 1 week if needed.    Dispense:  90 capsule    Refill:  3   Patient Instructions   Try the gabapentin once in the morning (and remain on 2 pills at night) for 1 week. If still having burning foot pain, can take dose at lunchtime as well. If you have side effects during the day, let me know as there are lower doses available.   There are some varicose  veins in your leg, but with slight swelling of that right leg at times, and your symptoms I will check a d-dimer or Crane clots test. If that is elevated, then can schedule an ultrasound.  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  Thanks for coming in today.     IF you received an x-ray today, you will receive an invoice from [facility]. Please contact [facility] at [phone] questions or concerns regarding your invoice.   IF you received labwork today, you will receive an invoice from [facility] at [phone] questions or concerns regarding your invoice.   Our billing staff will not be able to assist you with questions regarding bills from these companies.  You will be contacted with the lab results as soon as they are available. The fastest way to get your results is to activate your My Chart account. Instructions are located on the last page of this paperwork. If you have not heard from us [place] the results in 2 weeks, please contact this office.      I personally performed the services described in this documentation, which was scribed in my presence. The recorded information has been reviewed and considered for accuracy and completeness, addended by me as needed, and agree with information above.  Signed,   JefMerri RayD [facility] [place]11/08/18 6:14 PM

## 2017-07-25 NOTE — Patient Instructions (Addendum)
Try the gabapentin once in the morning (and remain on 2 pills at night) for 1 week. If still having burning foot pain, can take dose at lunchtime as well. If you have side effects during the day, let me know as there are lower doses available.   There are some varicose veins in your leg, but with slight swelling of that right leg at times, and your symptoms I will check a d-dimer or blood clots test. If that is elevated, then can schedule an ultrasound.  We can discuss next step for diabetes once I have your blood work back.  Thanks for coming in today.     IF you received an x-ray today, you will receive an invoice from [facility]. Please contact [facility] at [phone] with questions or concerns regarding your invoice.   IF you received labwork today, you will receive an invoice from [place]. Please contact [facility] at [phone] with questions or concerns regarding your invoice.   Our billing staff will not be able to assist you with questions regarding bills from these companies.  You will be contacted with the lab results as soon as they are available. The fastest way to get your results is to activate your My Chart account. Instructions are located on the last page of this paperwork. If you have not heard from [place] regarding the results in 2 weeks, please contact this office.

## 2017-07-26 LAB — COMPREHENSIVE METABOLIC PANEL
ALBUMIN: 4.8 g/dL (ref 3.5–5.5)
ALK PHOS: 49 IU/L (ref 39–117)
ALT: 12 IU/L (ref 0–44)
AST: 16 IU/L (ref 0–40)
Albumin/Globulin Ratio: 1.9 (ref 1.2–2.2)
BILIRUBIN TOTAL: 0.7 mg/dL (ref 0.0–1.2)
BUN / CREAT RATIO: 21 — AB (ref 9–20)
BUN: 21 mg/dL (ref 6–24)
CHLORIDE: 98 mmol/L (ref 96–106)
CO2: 24 mmol/L (ref 20–29)
CREATININE: 0.99 mg/dL (ref 0.76–1.27)
Calcium: 9.8 mg/dL (ref 8.7–10.2)
GFR calc Af Amer: 102 mL/min/{1.73_m2} (ref >59)
GFR calc non Af Amer: 88 mL/min/{1.73_m2} (ref >59)
GLUCOSE: 116 mg/dL — AB (ref 65–99)
Globulin, Total: 2.5 g/dL (ref 1.5–4.5)
Potassium: 4.5 mmol/L (ref 3.5–5.2)
Sodium: 139 mmol/L (ref 134–144)
TOTAL PROTEIN: 7.3 g/dL (ref 6.0–8.5)

## 2017-07-26 LAB — LIPID PANEL
CHOL/HDL RATIO: 3.8 ratio (ref 0.0–5.0)
Cholesterol, Total: 155 mg/dL (ref 100–199)
HDL: 41 mg/dL (ref >39)
LDL Calculated: 87 mg/dL (ref 0–99)
Triglycerides: 137 mg/dL (ref 0–149)
VLDL CHOLESTEROL CAL: 27 mg/dL (ref 5–40)

## 2017-07-26 LAB — HEMOGLOBIN A1C
ESTIMATED AVERAGE GLUCOSE: 171 mg/dL
HEMOGLOBIN A1C: 7.6 % — AB (ref 4.8–5.6)

## 2017-07-26 LAB — D-DIMER, QUANTITATIVE (NOT AT ARMC): D-DIMER: 0.75 mg{FEU}/L — AB (ref 0.00–0.49)

## 2017-10-15 ENCOUNTER — Telehealth: Payer: Self-pay

## 2017-10-15 ENCOUNTER — Telehealth: Payer: Self-pay | Admitting: Family Medicine

## 2017-10-15 NOTE — Telephone Encounter (Signed)
Pt advised.

## 2017-10-15 NOTE — Telephone Encounter (Signed)
Advised pt to take 200-400mg  QD of ibuprofen and tylenol x3 days per Dr. Carlota Raspberry.   Copied from [place]. Topic: General - Other >> Oct 15, 2017  8:53 AM Lolita Rieger, RMA wrote: Reason for CRM: pt would like a call back to see what he can take for tendonitis flare up since he is diabetic was told not to take ibuprofen wants to know if something can be called in or if there is something he take over the counter [phone] is his cell

## 2017-10-15 NOTE — Telephone Encounter (Signed)
Copied from [place]. Topic: General - Other >> Oct 15, 2017  8:53 AM Lolita Rieger, RMA wrote: Reason for CRM: pt would like a call back to see what he can take for tendonitis flare up since he is diabetic was told not to take ibuprofen wants to know if something can be called in or if there is something he take over the counter [phone] is his cell

## 2017-10-31 ENCOUNTER — Ambulatory Visit: Payer: BLUE CROSS/BLUE SHIELD | Admitting: Family Medicine

## 2017-10-31 ENCOUNTER — Encounter: Payer: Self-pay | Admitting: Family Medicine

## 2017-10-31 VITALS — BP 135/81 | HR 89 | Temp 98.5°F | Resp 20 | Ht 75.0 in | Wt 274.0 lb

## 2017-10-31 DIAGNOSIS — E785 Hyperlipidemia, unspecified: Secondary | ICD-10-CM | POA: Diagnosis not present

## 2017-10-31 DIAGNOSIS — G629 Polyneuropathy, unspecified: Secondary | ICD-10-CM | POA: Diagnosis not present

## 2017-10-31 DIAGNOSIS — R809 Proteinuria, unspecified: Secondary | ICD-10-CM | POA: Diagnosis not present

## 2017-10-31 DIAGNOSIS — E1129 Type 2 diabetes mellitus with other diabetic kidney complication: Secondary | ICD-10-CM

## 2017-10-31 MED ORDER — GABAPENTIN 300 MG PO CAPS: ORAL_CAPSULE | ORAL | 1 refills | Status: DC

## 2017-10-31 MED ORDER — ATORVASTATIN CALCIUM 10 MG PO TABS: 10.0000 mg | ORAL_TABLET | Freq: Every day | ORAL | 1 refills | Status: DC

## 2017-10-31 MED ORDER — GLIPIZIDE 5 MG PO TABS: 5.0000 mg | ORAL_TABLET | Freq: Every day | ORAL | 1 refills | Status: DC

## 2017-10-31 MED ORDER — METFORMIN HCL 500 MG PO TABS: 500.0000 mg | ORAL_TABLET | Freq: Two times a day (BID) | ORAL | 1 refills | Status: DC

## 2017-10-31 NOTE — Progress Notes (Signed)
I,Jennifer Gorman,acting as a Education administrator for Wendie Agreste, MD.,have documented all relevant documentation on the behalf of Wendie Agreste, MD,as directed by  Wendie Agreste, MD while in the presence of Wendie Agreste, MD.  Subjective:    Patient ID: David Crane, male    DOB: [birth year], 52 y.o.   MRN: [id number]  Chief Complaint  Patient presents with  . Diabetes    Follow up    HPI  David Crane is a 52 y.o. male who presents to Primary Care at [facility] for a follow-up of his diabetes.   Diabetes New diagnosis April 19, 2017. Complicated by peripheral neuropathy and kidney disease and creatinne up to 1.45. Neuropathy treated with gabapentin 300 mg qhs, improving at October visit. Option of second dose at bedtime discussed, for total of 600 mg. He reports taking one during the day, two at night. He will take his sugars 4-5 hours after eating lunch and states that they are around 129. The last few days in the morning his sugars have ranged from 129-164. His numbers are higher on the weekends when he is not as active. He is continuing on metformin 500 mg bid and glipizide 5 mg. No missed doses. He is not currently exercising. He eats fast food 5 times a week, Monday through Friday. At [employer] and [employer] he tries to choose healthier options. He is also trying to drink more water and has cut back on his soda intake. Occasional alcohol, socially.  Urine micro albumin, August 2018, that was elevated at 611. He is due for pneumonia vaccine. Foot exam, August 2018. He last saw his ophthalmologist October 2018, and was told right eye is hemorrhaging due to his diabetes.   Lab Results  Component Value Date   CREATININE 0.99 07/25/2017   Lab Results  Component Value Date   HGBA1C 7.6 (H) 07/25/2017   Wt Readings from Last 3 Encounters:  10/31/17 274 lb (124.3 kg)  07/25/17 262 lb 3.2 oz (118.9 kg)  06/24/17 259 lb 9.6 oz (117.8 kg)    Lipid screening Not currently on a  statin Lab Results  Component Value Date   CHOL 155 07/25/2017   HDL 41 07/25/2017   LDLCALC 87 07/25/2017   TRIG 137 07/25/2017   CHOLHDL 3.8 07/25/2017    Patient Active Problem List   Diagnosis Date Noted  . Diabetic nephropathy ([place]) 05/01/2017  . Diabetes ([place]) 05/01/2017  . Peripheral neuropathy 05/01/2017   No past medical history on file. No past surgical history on file. No Known Allergies Prior to Admission medications   Medication Sig Start Date End Date Taking? Authorizing Provider  blood glucose meter kit and supplies Dispense based on patient and insurance preference. Check twice per day. FOR ICD-9 250.00, 250.01). 04/29/17   Wendie Agreste, MD  gabapentin (NEURONTIN) 300 MG capsule Take 1-2 capsules (300-600 mg total) 3 (three) times daily as needed by mouth. Start with 379m QAM, continue 6028mQHS. Add afternoon dose of 30020mn 1 week if needed. 07/25/17   GreWendie AgresteD  glipiZIDE (GLUCOTROL) 5 MG tablet Take 1 tablet (5 mg total) by mouth daily before breakfast. 05/16/17   GreWendie AgresteD  metFORMIN (GLUCOPHAGE) 500 MG tablet Take 1 tablet (500 mg total) by mouth 2 (two) times daily with a meal. 05/16/17   GreWendie AgresteD  traMADol (ULTRAM) 50 MG tablet Take by mouth every 6 (six) hours as needed.    [provider]   Social History   Socioeconomic History  . Marital status: Married    Spouse name: Not on file  . Number of children: Not on file  . Years of education: Not on file  . Highest education level: Not on file  Social Needs  . Financial resource strain: Not on file  . Food insecurity - worry: Not on file  . Food insecurity - inability: Not on file  . Transportation needs - medical: Not on file  . Transportation needs - non-medical: Not on file  Occupational History  . Not on file  Tobacco Use  . Smoking status: Never Smoker  . Smokeless tobacco: Never Used  Substance and Sexual Activity  . Alcohol use: Yes     Comment: occ  . Drug use: No  . Sexual activity: Yes  Other Topics Concern  . Not on file  Social History Narrative  . Not on file     Review of Systems  Constitutional: Negative for fatigue and unexpected weight change.  Eyes: Negative for visual disturbance.  Respiratory: Negative for cough, chest tightness and shortness of breath.   Cardiovascular: Negative for chest pain, palpitations and leg swelling.  Gastrointestinal: Negative for abdominal pain and blood in stool.  Allergic/Immunologic: Positive for food allergies.  Neurological: Negative for dizziness, light-headedness and headaches.       Objective:   Physical Exam  Constitutional: He is oriented to person, place, and time. He appears well-developed and well-nourished. No distress.  HENT:  Head: Normocephalic and atraumatic.  Eyes: EOM are normal. Pupils are equal, round, and reactive to light.  Neck: No JVD present. Carotid bruit is not present.  Cardiovascular: Normal rate, regular rhythm and normal heart sounds.  No murmur heard. Pulmonary/Chest: Effort normal and breath sounds normal. He has no rales.  Musculoskeletal: He exhibits no edema.  Neurological: He is alert and oriented to person, place, and time.  Skin: Skin is warm and dry.  Psychiatric: He has a normal mood and affect.  Vitals reviewed.   Vitals:   10/31/17 1636  BP: 135/81  Pulse: 89  Resp: 20  Temp: 98.5 F (36.9 C)  TempSrc: Oral  SpO2: 98%  Weight: 274 lb (124.3 kg)  Height: _0  (1.905 m)       Assessment & Plan:    David Crane is a 52 y.o. male Type 2 diabetes mellitus with microalbuminuria, without long-term current use of insulin (HCC) - Plan: Hemoglobin A1c, glipiZIDE (GLUCOTROL) 5 MG tablet, metFORMIN (GLUCOPHAGE) 500 MG tablet, atorvastatin (LIPITOR) 10 MG tablet, Comprehensive metabolic panel  - check R0Q, continue same dose of metformin and glipizide for now. Stressed importance of diet and exercise management of  diabetes.  -With associated microhematuria, peripheral neuropathy, and reported I am now as it may be related to diabetes, stressed importance of aggressive control.   Peripheral polyneuropathy - Plan: gabapentin (NEURONTIN) 300 MG capsule  -Stable, continue same dose gabapentin for now.  Hyperlipidemia, unspecified hyperlipidemia type - Plan: atorvastatin (LIPITOR) 10 MG tablet, Comprehensive metabolic panel  -Lipitor 10 mg daily, CMP pending.  Meds ordered this encounter  Medications  . glipiZIDE (GLUCOTROL) 5 MG tablet    Sig: Take 1 tablet (5 mg total) by mouth daily before breakfast.    Dispense:  90 tablet    Refill:  1  . gabapentin (NEURONTIN) 300 MG capsule    Sig: 373m QAM,  6068mQHS.    Dispense:  270 capsule    Refill:  1  . metFORMIN (GLUCOPHAGE) 500 MG tablet    Sig: Take 1 tablet (500 mg total) by mouth 2 (two) times daily with a meal.    Dispense:  180 tablet    Refill:  1  . atorvastatin (LIPITOR) 10 MG tablet    Sig: Take 1 tablet (10 mg total) by mouth daily.    Dispense:  90 tablet    Refill:  1   Patient Instructions   Exercise needed most days per week. 150 minutes minimum per week. Cut back on fast food as much as possible. See info below on diet choices.  We will try to get notes from [facility] about your eyes.      Tips for Eating Away From Home If You Have Diabetes Controlling your level of blood glucose, also known as blood sugar, can be challenging. It can be even more difficult when you do not prepare your own meals. The following tips can help you manage your diabetes when you eat away from home. Planning ahead Plan ahead if you know you will be eating away from home:  Ask your health care provider how to time meals and medicine if you are taking insulin.  Make a list of restaurants near you that offer healthy choices. If they have a carry-out menu, take it home and plan what you will order ahead of time.  Look up the restaurant you want  to eat at online. Many chain and fast-food restaurants list nutritional information online. Use this information to choose the healthiest options and to calculate how many carbohydrates will be in your meal.  Use a carbohydrate-counting book or mobile app to look up the carbohydrate content and serving size of the foods you want to eat.  Become familiar with serving sizes and learn to recognize how many servings are in a portion. This will allow you to estimate how many carbohydrates you can eat.  Free foods A "free food" is any food or drink that has less than 5 g of carbohydrates per serving. Free foods include:  Many vegetables.  Hard boiled eggs.  Nuts or seeds.  Olives.  Cheeses.  Meats.  These types of foods make good appetizer choices and are often available at salad bars. Lemon juice, vinegar, or a low-calorie salad dressing of fewer than 20 calories per serving can be used as a "free" salad dressing. Choices to reduce carbohydrates  Substitute nonfat sweetened yogurt with a sugar-free yogurt. Yogurt made from soy milk may also be used, but you will still want a sugar-free or plain option to choose a lower carbohydrate amount.  Ask your server to take away the bread basket or chips from your table.  Order fresh fruit. A salad bar often offers fresh fruit choices. Avoid canned fruit because it is usually packed in sugar or syrup.  Order a salad, and eat it without dressing. Or, create a "free" salad dressing.  Ask for substitutions. For example, instead of [place] fries, request an order of a vegetable such as salad, green beans, or broccoli. Other tips  If you take insulin, take the insulin once your food arrives to your table. This will ensure your insulin and food are timed correctly.  Ask your server about the portion size before your order, and ask for a take-out box if the portion has more servings than you should have. When your food comes, leave the amount you  should have on the plate, and put the rest in the take-out  box.  Consider splitting an entree with someone and ordering a side salad. This information is not intended to replace advice given to you by your health care provider. Make sure you discuss any questions you have with your health care provider. Document Released: 09/03/2005 Document Revised: 02/09/2016 Document Reviewed: 12/01/2013 Elsevier Interactive Patient Education  2018 [employer].     IF you received an x-ray today, you will receive an invoice from [facility]. Please contact [facility] at [phone] with questions or concerns regarding your invoice.   IF you received labwork today, you will receive an invoice from [place]. Please contact [facility] at [phone] with questions or concerns regarding your invoice.   Our billing staff will not be able to assist you with questions regarding bills from these companies.  You will be contacted with the lab results as soon as they are available. The fastest way to get your results is to activate your My Chart account. Instructions are located on the last page of this paperwork. If you have not heard from [place] regarding the results in 2 weeks, please contact this office.      I personally performed the services described in this documentation, which was scribed in my presence. The recorded information has been reviewed and considered for accuracy and completeness, addended by me as needed, and agree with information above.  Signed,   [name], MD Primary [facility] at [place].  11/02/17 6:09 PM

## 2017-10-31 NOTE — Patient Instructions (Addendum)
Exercise needed most days per week. 150 minutes minimum per week. Cut back on fast food as much as possible. See info below on diet choices.  We will try to get notes from [facility] about your eyes.      Tips for Eating Away From Home If You Have Diabetes Controlling your level of blood glucose, also known as blood sugar, can be challenging. It can be even more difficult when you do not prepare your own meals. The following tips can help you manage your diabetes when you eat away from home. Planning ahead Plan ahead if you know you will be eating away from home:  Ask your health care provider how to time meals and medicine if you are taking insulin.  Make a list of restaurants near you that offer healthy choices. If they have a carry-out menu, take it home and plan what you will order ahead of time.  Look up the restaurant you want to eat at online. Many chain and fast-food restaurants list nutritional information online. Use this information to choose the healthiest options and to calculate how many carbohydrates will be in your meal.  Use a carbohydrate-counting book or mobile app to look up the carbohydrate content and serving size of the foods you want to eat.  Become familiar with serving sizes and learn to recognize how many servings are in a portion. This will allow you to estimate how many carbohydrates you can eat.  Free foods A "free food" is any food or drink that has less than 5 g of carbohydrates per serving. Free foods include:  Many vegetables.  Hard boiled eggs.  Nuts or seeds.  Olives.  Cheeses.  Meats.  These types of foods make good appetizer choices and are often available at salad bars. Lemon juice, vinegar, or a low-calorie salad dressing of fewer than 20 calories per serving can be used as a "free" salad dressing. Choices to reduce carbohydrates  Substitute nonfat sweetened yogurt with a sugar-free yogurt. Yogurt made from soy milk may also be used,  but you will still want a sugar-free or plain option to choose a lower carbohydrate amount.  Ask your server to take away the bread basket or chips from your table.  Order fresh fruit. A salad bar often offers fresh fruit choices. Avoid canned fruit because it is usually packed in sugar or syrup.  Order a salad, and eat it without dressing. Or, create a "free" salad dressing.  Ask for substitutions. For example, instead of [place] fries, request an order of a vegetable such as salad, green beans, or broccoli. Other tips  If you take insulin, take the insulin once your food arrives to your table. This will ensure your insulin and food are timed correctly.  Ask your server about the portion size before your order, and ask for a take-out box if the portion has more servings than you should have. When your food comes, leave the amount you should have on the plate, and put the rest in the take-out box.  Consider splitting an entree with someone and ordering a side salad. This information is not intended to replace advice given to you by your health care provider. Make sure you discuss any questions you have with your health care provider. Document Released: 09/03/2005 Document Revised: 02/09/2016 Document Reviewed: 12/01/2013 Elsevier Interactive Patient Education  2018 [employer].     IF you received an x-ray today, you will receive an invoice from [facility]. Please contact [facility]  Radiology at [phone] with questions or concerns regarding your invoice.   IF you received labwork today, you will receive an invoice from [facility]. Please contact [facility] at [phone] with questions or concerns regarding your invoice.   Our billing staff will not be able to assist you with questions regarding bills from these companies.  You will be contacted with the lab results as soon as they are available. The fastest way to get your results is to activate your My Chart account.  Instructions are located on the last page of this paperwork. If you have not heard from [place] regarding the results in 2 weeks, please contact this office.

## 2017-11-01 LAB — COMPREHENSIVE METABOLIC PANEL
A/G RATIO: 1.9 (ref 1.2–2.2)
ALBUMIN: 4.9 g/dL (ref 3.5–5.5)
ALT: 17 IU/L (ref 0–44)
AST: 19 IU/L (ref 0–40)
Alkaline Phosphatase: 45 IU/L (ref 39–117)
BILIRUBIN TOTAL: 0.7 mg/dL (ref 0.0–1.2)
BUN / CREAT RATIO: 25 — AB (ref 9–20)
BUN: 21 mg/dL (ref 6–24)
CALCIUM: 9.9 mg/dL (ref 8.7–10.2)
CHLORIDE: 100 mmol/L (ref 96–106)
CO2: 23 mmol/L (ref 20–29)
Creatinine, Ser: 0.84 mg/dL (ref 0.76–1.27)
GFR calc Af Amer: 117 mL/min/{1.73_m2} (ref >59)
GFR, EST NON AFRICAN AMERICAN: 101 mL/min/{1.73_m2} (ref >59)
GLOBULIN, TOTAL: 2.6 g/dL (ref 1.5–4.5)
Glucose: 137 mg/dL — ABNORMAL HIGH (ref 65–99)
POTASSIUM: 4.7 mmol/L (ref 3.5–5.2)
Sodium: 140 mmol/L (ref 134–144)
Total Protein: 7.5 g/dL (ref 6.0–8.5)

## 2017-11-01 LAB — HEMOGLOBIN A1C
Est. average glucose Bld gHb Est-mCnc: 171 mg/dL
Hgb A1c MFr Bld: 7.6 % — ABNORMAL HIGH (ref 4.8–5.6)

## 2017-11-03 ENCOUNTER — Other Ambulatory Visit: Payer: Self-pay | Admitting: Family Medicine

## 2017-11-03 DIAGNOSIS — R809 Proteinuria, unspecified: Principal | ICD-10-CM

## 2017-11-03 DIAGNOSIS — E1129 Type 2 diabetes mellitus with other diabetic kidney complication: Secondary | ICD-10-CM

## 2017-11-03 MED ORDER — GLIPIZIDE 5 MG PO TABS: 5.0000 mg | ORAL_TABLET | Freq: Two times a day (BID) | ORAL | 1 refills | Status: DC

## 2017-11-03 NOTE — Progress Notes (Signed)
Changing glipizide dose based on elevated A1c. See lab notes

## 2017-12-17 ENCOUNTER — Encounter: Payer: Self-pay | Admitting: Family Medicine

## 2017-12-17 ENCOUNTER — Ambulatory Visit: Payer: BLUE CROSS/BLUE SHIELD | Admitting: Family Medicine

## 2017-12-17 VITALS — BP 121/77 | HR 104 | Temp 98.5°F | Resp 17 | Ht 75.0 in | Wt 275.0 lb

## 2017-12-17 DIAGNOSIS — E1140 Type 2 diabetes mellitus with diabetic neuropathy, unspecified: Secondary | ICD-10-CM

## 2017-12-17 DIAGNOSIS — E785 Hyperlipidemia, unspecified: Secondary | ICD-10-CM

## 2017-12-17 NOTE — Patient Instructions (Addendum)
Increasing acitivity/exercise may help with blood sugar.  150 minutes per week.  Ok to continue same dose of metformin and glipizide for now - recheck in 6 weeks.  I can refer you to nutritionist if you would like. Iver Nestle with [place] would be a recommendation.    Diabetes Mellitus and Nutrition When you have diabetes (diabetes mellitus), it is very important to have healthy eating habits because your blood sugar (glucose) levels are greatly affected by what you eat and drink. Eating healthy foods in the appropriate amounts, at about the same times every day, can help you:  Control your blood glucose.  Lower your risk of heart disease.  Improve your blood pressure.  Reach or maintain a healthy weight.  Every person with diabetes is different, and each person has different needs for a meal plan. Your health care provider may recommend that you work with a diet and nutrition specialist (dietitian) to make a meal plan that is best for you. Your meal plan may vary depending on factors such as:  The calories you need.  The medicines you take.  Your weight.  Your blood glucose, blood pressure, and cholesterol levels.  Your activity level.  Other health conditions you have, such as heart or kidney disease.  How do carbohydrates affect me? Carbohydrates affect your blood glucose level more than any other type of food. Eating carbohydrates naturally increases the amount of glucose in your blood. Carbohydrate counting is a method for keeping track of how many carbohydrates you eat. Counting carbohydrates is important to keep your blood glucose at a healthy level, especially if you use insulin or take certain oral diabetes medicines. It is important to know how many carbohydrates you can safely have in each meal. This is different for every person. Your dietitian can help you calculate how many carbohydrates you should have at each meal and for snack. Foods that contain carbohydrates  include:  Bread, cereal, rice, pasta, and crackers.  Potatoes and corn.  Peas, beans, and lentils.  Milk and yogurt.  Fruit and juice.  Desserts, such as cakes, cookies, ice cream, and candy.  How does alcohol affect me? Alcohol can cause a sudden decrease in blood glucose (hypoglycemia), especially if you use insulin or take certain oral diabetes medicines. Hypoglycemia can be a life-threatening condition. Symptoms of hypoglycemia (sleepiness, dizziness, and confusion) are similar to symptoms of having too much alcohol. If your health care provider says that alcohol is safe for you, follow these guidelines:  Limit alcohol intake to no more than 1 drink per day for nonpregnant women and 2 drinks per day for men. One drink equals 12 oz of beer, 5 oz of wine, or 1 oz of hard liquor.  Do not drink on an empty stomach.  Keep yourself hydrated with water, diet soda, or unsweetened iced tea.  Keep in mind that regular soda, juice, and other mixers may contain a lot of sugar and must be counted as carbohydrates.  What are tips for following this plan? Reading food labels  Start by checking the serving size on the label. The amount of calories, carbohydrates, fats, and other nutrients listed on the label are based on one serving of the food. Many foods contain more than one serving per package.  Check the total grams (g) of carbohydrates in one serving. You can calculate the number of servings of carbohydrates in one serving by dividing the total carbohydrates by 15. For example, if a food has 30 g  of total carbohydrates, it would be equal to 2 servings of carbohydrates.  Check the number of grams (g) of saturated and trans fats in one serving. Choose foods that have low or no amount of these fats.  Check the number of milligrams (mg) of sodium in one serving. Most people should limit total sodium intake to less than 2,300 mg per day.  Always check the nutrition information of foods  labeled as "low-fat" or "nonfat". These foods may be higher in added sugar or refined carbohydrates and should be avoided.  Talk to your dietitian to identify your daily goals for nutrients listed on the label. Shopping  Avoid buying canned, premade, or processed foods. These foods tend to be high in fat, sodium, and added sugar.  Shop around the outside edge of the grocery store. This includes fresh fruits and vegetables, bulk grains, fresh meats, and fresh dairy. Cooking  Use low-heat cooking methods, such as baking, instead of high-heat cooking methods like deep frying.  Cook using healthy oils, such as olive, canola, or sunflower oil.  Avoid cooking with butter, cream, or high-fat meats. Meal planning  Eat meals and snacks regularly, preferably at the same times every day. Avoid going long periods of time without eating.  Eat foods high in fiber, such as fresh fruits, vegetables, beans, and whole grains. Talk to your dietitian about how many servings of carbohydrates you can eat at each meal.  Eat 4-6 ounces of lean protein each day, such as lean meat, chicken, fish, eggs, or tofu. 1 ounce is equal to 1 ounce of meat, chicken, or fish, 1 egg, or 1/4 cup of tofu.  Eat some foods each day that contain healthy fats, such as avocado, nuts, seeds, and fish. Lifestyle   Check your blood glucose regularly.  Exercise at least 30 minutes 5 or more days each week, or as told by your health care provider.  Take medicines as told by your health care provider.  Do not use any products that contain nicotine or tobacco, such as cigarettes and e-cigarettes. If you need help quitting, ask your health care provider.  Work with a Social worker or diabetes educator to identify strategies to manage stress and any emotional and social challenges. What are some questions to ask my health care provider?  Do I need to meet with a diabetes educator?  Do I need to meet with a dietitian?  What number  can I call if I have questions?  When are the best times to check my blood glucose? Where to find more information:  American Diabetes Association: diabetes.org/food-and-fitness/food  Academy of Nutrition and Dietetics: PokerClues.dk  [employer] of Diabetes and Digestive and Kidney Diseases (NIH): ContactWire.be Summary  A healthy meal plan will help you control your blood glucose and maintain a healthy lifestyle.  Working with a diet and nutrition specialist (dietitian) can help you make a meal plan that is best for you.  Keep in mind that carbohydrates and alcohol have immediate effects on your blood glucose levels. It is important to count carbohydrates and to use alcohol carefully. This information is not intended to replace advice given to you by your health care provider. Make sure you discuss any questions you have with your health care provider. Document Released: 05/31/2005 Document Revised: 10/08/2016 Document Reviewed: 10/08/2016 Elsevier Interactive Patient Education  2018 [place] for Eating Away From Home If You Have Diabetes Controlling your level of blood glucose, also known as blood sugar, can be  challenging. It can be even more difficult when you do not prepare your own meals. The following tips can help you manage your diabetes when you eat away from home. Planning ahead Plan ahead if you know you will be eating away from home:  Ask your health care provider how to time meals and medicine if you are taking insulin.  Make a list of restaurants near you that offer healthy choices. If they have a carry-out menu, take it home and plan what you will order ahead of time.  Look up the restaurant you want to eat at online. Many chain and fast-food restaurants list nutritional information online. Use this information to choose the  healthiest options and to calculate how many carbohydrates will be in your meal.  Use a carbohydrate-counting book or mobile app to look up the carbohydrate content and serving size of the foods you want to eat.  Become familiar with serving sizes and learn to recognize how many servings are in a portion. This will allow you to estimate how many carbohydrates you can eat.  Free foods A "free food" is any food or drink that has less than 5 g of carbohydrates per serving. Free foods include:  Many vegetables.  Hard boiled eggs.  Nuts or seeds.  Olives.  Cheeses.  Meats.  These types of foods make good appetizer choices and are often available at salad bars. Lemon juice, vinegar, or a low-calorie salad dressing of fewer than 20 calories per serving can be used as a "free" salad dressing. Choices to reduce carbohydrates  Substitute nonfat sweetened yogurt with a sugar-free yogurt. Yogurt made from soy milk may also be used, but you will still want a sugar-free or plain option to choose a lower carbohydrate amount.  Ask your server to take away the bread basket or chips from your table.  Order fresh fruit. A salad bar often offers fresh fruit choices. Avoid canned fruit because it is usually packed in sugar or syrup.  Order a salad, and eat it without dressing. Or, create a "free" salad dressing.  Ask for substitutions. For example, instead of [place] fries, request an order of a vegetable such as salad, green beans, or broccoli. Other tips  If you take insulin, take the insulin once your food arrives to your table. This will ensure your insulin and food are timed correctly.  Ask your server about the portion size before your order, and ask for a take-out box if the portion has more servings than you should have. When your food comes, leave the amount you should have on the plate, and put the rest in the take-out box.  Consider splitting an entree with someone and ordering a side  salad. This information is not intended to replace advice given to you by your health care provider. Make sure you discuss any questions you have with your health care provider. Document Released: 09/03/2005 Document Revised: 02/09/2016 Document Reviewed: 12/01/2013 Elsevier Interactive Patient Education  2018 [employer].     IF you received an x-ray today, you will receive an invoice from [facility] Radiology. Please contact [facility] Radiology at [phone] with questions or concerns regarding your invoice.   IF you received labwork today, you will receive an invoice from [place]. Please contact [facility] at [phone] with questions or concerns regarding your invoice.   Our billing staff will not be able to assist you with questions regarding bills from these companies.  You will be contacted with the lab results as soon  as they are available. The fastest way to get your results is to activate your My Chart account. Instructions are located on the last page of this paperwork. If you have not heard from [place] regarding the results in 2 weeks, please contact this office.

## 2017-12-17 NOTE — Progress Notes (Addendum)
Subjective:  By signing my name below, I, David Crane, attest that this documentation has been prepared under the direction and in the presence of [name], MD. Electronically Signed: [name], [place]. 12/17/2017 , 5:44 PM .  Patient was seen in Room 1 .   Patient ID: David Crane, male    DOB: 1965-10-18, 52 y.o.   MRN: [id number] Chief Complaint  Patient presents with  . Follow-up    liver test and home Crane sugar    HPI David Crane is a 52 y.o. male Here for follow up. Patient was last seen on Feb 14th and had glucose of 137, and A1C of 7.6; increased glipizide to BID and continued on same dose metformin.   Lab Results  Component Value Date   HGBA1C 7.6 (H) 10/31/2017   HGBA1C 7.6 (H) 07/25/2017   Patient was started on Lipitor at last visit. He denies any new myalgias, and is tolerating medication without difficulty.   Patient states he's doing overall alright. He reports his sugar was 165 this morning, 120 yesterday morning, and last Thursday (5 days ago) was 82. He denies any hypoglycemic symptoms. He's stopped eating 2 hours before bed time, so no more late night meals. He's still eating similar diet, with an addition of an orange. He mentions eating an apple for snacks. He's been watching what he eats, and avoiding carbs and sugars. He hasn't met with a nutritionist yet. He denies exercising regularly, but does mention having an active job with walking a lot for work around [employer]. He plans to start walking his dog as weather improves. He denies chest pain, shortness of breath, lightheadedness, or dizziness with medications. He had questions regarding Atkins diet and Atkins snacks, if it would be beneficial for him.   Wt Readings from Last 3 Encounters:  12/17/17 275 lb (124.7 kg)  10/31/17 274 lb (124.3 kg)  07/25/17 262 lb 3.2 oz (118.9 kg)    Patient Active Problem List   Diagnosis Date Noted  . Diabetic nephropathy ([place]) 05/01/2017  . Diabetes  ([place]) 05/01/2017  . Peripheral neuropathy 05/01/2017   No past medical history on file. No past surgical history on file. No Known Allergies Prior to Admission medications   Medication Sig Start Date End Date Taking? Authorizing Provider  atorvastatin (LIPITOR) 10 MG tablet Take 1 tablet (10 mg total) by mouth daily. 10/31/17  Yes Wendie Agreste, MD  Crane glucose meter kit and supplies Dispense based on patient and insurance preference. Check twice per day. FOR ICD-9 250.00, 250.01). 04/29/17  Yes Wendie Agreste, MD  gabapentin (NEURONTIN) 300 MG capsule 338m QAM,  6051mQHS. 10/31/17  Yes GrWendie AgresteMD  glipiZIDE (GLUCOTROL) 5 MG tablet Take 1 tablet (5 mg total) by mouth 2 (two) times daily before a meal. 11/03/17  Yes GrWendie AgresteMD  metFORMIN (GLUCOPHAGE) 500 MG tablet Take 1 tablet (500 mg total) by mouth 2 (two) times daily with a meal. 10/31/17  Yes GrWendie AgresteMD   Social History   Socioeconomic History  . Marital status: Married    Spouse name: Not on file  . Number of children: Not on file  . Years of education: Not on file  . Highest education level: Not on file  Occupational History  . Not on file  Social Needs  . Financial resource strain: Not on file  . Food insecurity:    Worry: Not on file    Inability: Not  on file  . Transportation needs:    Medical: Not on file    Non-medical: Not on file  Tobacco Use  . Smoking status: Never Smoker  . Smokeless tobacco: Never Used  Substance and Sexual Activity  . Alcohol use: Yes    Comment: occ  . Drug use: No  . Sexual activity: Yes  Lifestyle  . Physical activity:    Days per week: Not on file    Minutes per session: Not on file  . Stress: Not on file  Relationships  . Social connections:    Talks on phone: Not on file    Gets together: Not on file    Attends religious service: Not on file    Active member of club or organization: Not on file    Attends meetings of clubs or  organizations: Not on file    Relationship status: Not on file  . Intimate partner violence:    Fear of current or ex partner: Not on file    Emotionally abused: Not on file    Physically abused: Not on file    Forced sexual activity: Not on file  Other Topics Concern  . Not on file  Social History Narrative  . Not on file   Review of Systems  Constitutional: Negative for fatigue and unexpected weight change.  Eyes: Negative for visual disturbance.  Respiratory: Negative for cough, chest tightness and shortness of breath.   Cardiovascular: Negative for chest pain, palpitations and leg swelling.  Gastrointestinal: Negative for abdominal pain and Crane in stool.  Neurological: Negative for dizziness, light-headedness and headaches.       Objective:   Physical Exam  Constitutional: He is oriented to person, place, and time. He appears well-developed and well-nourished.  HENT:  Head: Normocephalic and atraumatic.  Eyes: Pupils are equal, round, and reactive to light. EOM are normal.  Neck: No JVD present. Carotid bruit is not present.  Cardiovascular: Normal rate, regular rhythm and normal heart sounds.  No murmur heard. Pulmonary/Chest: Effort normal and breath sounds normal. He has no rales.  Musculoskeletal: He exhibits no edema.  Neurological: He is alert and oriented to person, place, and time.  Skin: Skin is warm and dry.  Psychiatric: He has a normal mood and affect.  Vitals reviewed.   Vitals:   12/17/17 1614  BP: 121/77  Pulse: (!) 104  Resp: 17  Temp: 98.5 F (36.9 C)  TempSrc: Oral  SpO2: 98%  Weight: 275 lb (124.7 kg)  Height: 6' 3" (1.905 m)       Assessment & Plan:   David Crane is a 52 y.o. male Type 2 diabetes mellitus with diabetic neuropathy, without long-term current use of insulin (HCC)  -Recommended increase in activity/exercise in addition to routine walking with work.  He can also use a step counter to determine amount of walking he is  doing through work.  150 minutes minimum per week.  -Continue same dose of glipizide, metformin  -Handout given on nutrition with diabetes as well as tips for eating away from home.  Ultimately I think he would benefit with meeting with nutritionist, and recommended name. If he would like to have that referral, he will let me know.  Plans to check into cost.  Hyperlipidemia, unspecified hyperlipidemia type - Plan: Hepatic Function Panel  -Tolerating statin, check hepatic function panel.  recheck 6 weeks  No orders of the defined types were placed in this encounter.  Patient Instructions   Increasing acitivity/exercise  may help with Crane sugar.  150 minutes per week.  Ok to continue same dose of metformin and glipizide for now - recheck in 6 weeks.  I can refer you to nutritionist if you would like. Iver Nestle with [facility] would be a recommendation.    Diabetes Mellitus and Nutrition When you have diabetes (diabetes mellitus), it is very important to have healthy eating habits because your Crane sugar (glucose) levels are greatly affected by what you eat and drink. Eating healthy foods in the appropriate amounts, at about the same times every day, can help you:  Control your Crane glucose.  Lower your risk of heart disease.  Improve your Crane pressure.  Reach or maintain a healthy weight.  Every person with diabetes is different, and each person has different needs for a meal plan. Your health care provider may recommend that you work with a diet and nutrition specialist (dietitian) to make a meal plan that is best for you. Your meal plan may vary depending on factors such as:  The calories you need.  The medicines you take.  Your weight.  Your Crane glucose, Crane pressure, and cholesterol levels.  Your activity level.  Other health conditions you have, such as heart or kidney disease.  How do carbohydrates affect me? Carbohydrates affect your Crane glucose level  more than any other type of food. Eating carbohydrates naturally increases the amount of glucose in your Crane. Carbohydrate counting is a method for keeping track of how many carbohydrates you eat. Counting carbohydrates is important to keep your Crane glucose at a healthy level, especially if you use insulin or take certain oral diabetes medicines. It is important to know how many carbohydrates you can safely have in each meal. This is different for every person. Your dietitian can help you calculate how many carbohydrates you should have at each meal and for snack. Foods that contain carbohydrates include:  Bread, cereal, rice, pasta, and crackers.  Potatoes and corn.  Peas, beans, and lentils.  Milk and yogurt.  Fruit and juice.  Desserts, such as cakes, cookies, ice cream, and candy.  How does alcohol affect me? Alcohol can cause a sudden decrease in Crane glucose (hypoglycemia), especially if you use insulin or take certain oral diabetes medicines. Hypoglycemia can be a life-threatening condition. Symptoms of hypoglycemia (sleepiness, dizziness, and confusion) are similar to symptoms of having too much alcohol. If your health care provider says that alcohol is safe for you, follow these guidelines:  Limit alcohol intake to no more than 1 drink per day for nonpregnant women and 2 drinks per day for men. One drink equals 12 oz of beer, 5 oz of wine, or 1 oz of hard liquor.  Do not drink on an empty stomach.  Keep yourself hydrated with water, diet soda, or unsweetened iced tea.  Keep in mind that regular soda, juice, and other mixers may contain a lot of sugar and must be counted as carbohydrates.  What are tips for following this plan? Reading food labels  Start by checking the serving size on the label. The amount of calories, carbohydrates, fats, and other nutrients listed on the label are based on one serving of the food. Many foods contain more than one serving per  package.  Check the total grams (g) of carbohydrates in one serving. You can calculate the number of servings of carbohydrates in one serving by dividing the total carbohydrates by 15. For example, if a food has 30 g of  total carbohydrates, it would be equal to 2 servings of carbohydrates.  Check the number of grams (g) of saturated and trans fats in one serving. Choose foods that have low or no amount of these fats.  Check the number of milligrams (mg) of sodium in one serving. Most people should limit total sodium intake to less than 2,300 mg per day.  Always check the nutrition information of foods labeled as "low-fat" or "nonfat". These foods may be higher in added sugar or refined carbohydrates and should be avoided.  Talk to your dietitian to identify your daily goals for nutrients listed on the label. Shopping  Avoid buying canned, premade, or processed foods. These foods tend to be high in fat, sodium, and added sugar.  Shop around the outside edge of the grocery store. This includes fresh fruits and vegetables, bulk grains, fresh meats, and fresh dairy. Cooking  Use low-heat cooking methods, such as baking, instead of high-heat cooking methods like deep frying.  Cook using healthy oils, such as olive, canola, or sunflower oil.  Avoid cooking with butter, cream, or high-fat meats. Meal planning  Eat meals and snacks regularly, preferably at the same times every day. Avoid going long periods of time without eating.  Eat foods high in fiber, such as fresh fruits, vegetables, beans, and whole grains. Talk to your dietitian about how many servings of carbohydrates you can eat at each meal.  Eat 4-6 ounces of lean protein each day, such as lean meat, chicken, fish, eggs, or tofu. 1 ounce is equal to 1 ounce of meat, chicken, or fish, 1 egg, or 1/4 cup of tofu.  Eat some foods each day that contain healthy fats, such as avocado, nuts, seeds, and fish. Lifestyle   Check your Crane  glucose regularly.  Exercise at least 30 minutes 5 or more days each week, or as told by your health care provider.  Take medicines as told by your health care provider.  Do not use any products that contain nicotine or tobacco, such as cigarettes and e-cigarettes. If you need help quitting, ask your health care provider.  Work with a Social worker or diabetes educator to identify strategies to manage stress and any emotional and social challenges. What are some questions to ask my health care provider?  Do I need to meet with a diabetes educator?  Do I need to meet with a dietitian?  What number can I call if I have questions?  When are the best times to check my Crane glucose? Where to find more information:  American Diabetes Association: diabetes.org/food-and-fitness/food  Academy of Nutrition and Dietetics: PokerClues.dk  [employer] of Diabetes and Digestive and Kidney Diseases (NIH): ContactWire.be Summary  A healthy meal plan will help you control your Crane glucose and maintain a healthy lifestyle.  Working with a diet and nutrition specialist (dietitian) can help you make a meal plan that is best for you.  Keep in mind that carbohydrates and alcohol have immediate effects on your Crane glucose levels. It is important to count carbohydrates and to use alcohol carefully. This information is not intended to replace advice given to you by your health care provider. Make sure you discuss any questions you have with your health care provider. Document Released: 05/31/2005 Document Revised: 10/08/2016 Document Reviewed: 10/08/2016 Elsevier Interactive Patient Education  2018 [place] for Eating Away From Home If You Have Diabetes Controlling your level of Crane glucose, also known as Crane sugar, can be challenging. It  can be even more  difficult when you do not prepare your own meals. The following tips can help you manage your diabetes when you eat away from home. Planning ahead Plan ahead if you know you will be eating away from home:  Ask your health care provider how to time meals and medicine if you are taking insulin.  Make a list of restaurants near you that offer healthy choices. If they have a carry-out menu, take it home and plan what you will order ahead of time.  Look up the restaurant you want to eat at online. Many chain and fast-food restaurants list nutritional information online. Use this information to choose the healthiest options and to calculate how many carbohydrates will be in your meal.  Use a carbohydrate-counting book or mobile app to look up the carbohydrate content and serving size of the foods you want to eat.  Become familiar with serving sizes and learn to recognize how many servings are in a portion. This will allow you to estimate how many carbohydrates you can eat.  Free foods A "free food" is any food or drink that has less than 5 g of carbohydrates per serving. Free foods include:  Many vegetables.  Hard boiled eggs.  Nuts or seeds.  Olives.  Cheeses.  Meats.  These types of foods make good appetizer choices and are often available at salad bars. Lemon juice, vinegar, or a low-calorie salad dressing of fewer than 20 calories per serving can be used as a "free" salad dressing. Choices to reduce carbohydrates  Substitute nonfat sweetened yogurt with a sugar-free yogurt. Yogurt made from soy milk may also be used, but you will still want a sugar-free or plain option to choose a lower carbohydrate amount.  Ask your server to take away the bread basket or chips from your table.  Order fresh fruit. A salad bar often offers fresh fruit choices. Avoid canned fruit because it is usually packed in sugar or syrup.  Order a salad, and eat it without dressing. Or, create a "free" salad  dressing.  Ask for substitutions. For example, instead of [place] fries, request an order of a vegetable such as salad, green beans, or broccoli. Other tips  If you take insulin, take the insulin once your food arrives to your table. This will ensure your insulin and food are timed correctly.  Ask your server about the portion size before your order, and ask for a take-out box if the portion has more servings than you should have. When your food comes, leave the amount you should have on the plate, and put the rest in the take-out box.  Consider splitting an entree with someone and ordering a side salad. This information is not intended to replace advice given to you by your health care provider. Make sure you discuss any questions you have with your health care provider. Document Released: 09/03/2005 Document Revised: 02/09/2016 Document Reviewed: 12/01/2013 Elsevier Interactive Patient Education  2018 [employer].     IF you received an x-ray today, you will receive an invoice from [facility]. Please contact [facility] at [phone] with questions or concerns regarding your invoice.   IF you received labwork today, you will receive an invoice from [facility]. Please contact [facility] at [phone] with questions or concerns regarding your invoice.   Our billing staff will not be able to assist you with questions regarding bills from these companies.  You will be contacted with the lab results as soon as they  are available. The fastest way to get your results is to activate your My Chart account. Instructions are located on the last page of this paperwork. If you have not heard from [place] regarding the results in 2 weeks, please contact this office.       I personally performed the services described in this documentation, which was scribed in my presence. The recorded information has been reviewed and considered for accuracy and completeness, addended by me as  needed, and agree with information above.  Signed,   [name], MD [facility].  12/20/17 1:13 PM

## 2017-12-18 LAB — HEPATIC FUNCTION PANEL
ALK PHOS: 46 IU/L (ref 39–117)
ALT: 20 IU/L (ref 0–44)
AST: 22 IU/L (ref 0–40)
Albumin: 4.7 g/dL (ref 3.5–5.5)
BILIRUBIN, DIRECT: 0.2 mg/dL (ref 0.00–0.40)
Bilirubin Total: 0.7 mg/dL (ref 0.0–1.2)
Total Protein: 7.2 g/dL (ref 6.0–8.5)

## 2017-12-20 ENCOUNTER — Encounter: Payer: Self-pay | Admitting: Family Medicine

## 2018-01-30 ENCOUNTER — Ambulatory Visit: Payer: BLUE CROSS/BLUE SHIELD | Admitting: Family Medicine

## 2018-01-30 ENCOUNTER — Encounter: Payer: Self-pay | Admitting: Family Medicine

## 2018-01-30 VITALS — BP 122/70 | HR 99 | Temp 98.1°F | Ht 75.0 in | Wt 273.8 lb

## 2018-01-30 DIAGNOSIS — R809 Proteinuria, unspecified: Secondary | ICD-10-CM | POA: Diagnosis not present

## 2018-01-30 DIAGNOSIS — G6289 Other specified polyneuropathies: Secondary | ICD-10-CM | POA: Diagnosis not present

## 2018-01-30 DIAGNOSIS — Z794 Long term (current) use of insulin: Secondary | ICD-10-CM

## 2018-01-30 DIAGNOSIS — E785 Hyperlipidemia, unspecified: Secondary | ICD-10-CM

## 2018-01-30 DIAGNOSIS — E1129 Type 2 diabetes mellitus with other diabetic kidney complication: Secondary | ICD-10-CM

## 2018-01-30 DIAGNOSIS — E1140 Type 2 diabetes mellitus with diabetic neuropathy, unspecified: Secondary | ICD-10-CM

## 2018-01-30 MED ORDER — ATORVASTATIN CALCIUM 10 MG PO TABS: 10.0000 mg | ORAL_TABLET | Freq: Every day | ORAL | 1 refills | Status: DC

## 2018-01-30 NOTE — Progress Notes (Signed)
Subjective:  By signing my name below, I, Essence Howell, attest that this documentation has been prepared under the direction and in the presence of Wendie Agreste, MD Electronically Signed: Ladene Artist, ED Scribe 01/30/2018 at 5:08 PM.   Patient ID: David Crane, male    DOB: May 06, 1966, 52 y.o.   MRN: [id number]  Chief Complaint  Patient presents with  . [place] conditions    6 week follow up    HPI  David Crane is a 52 y.o. male who presents to [facility] at [facility] for f/u. Pt last ate around 12:30 PM today.  DM Lab Results  Component Value Date   HGBA1C 7.6 (H) 10/31/2017  Increased 5 mg glipizide to bid. Continued metformin 500 mg bid. Discussed increase in activity and exercise. Handouts with nutrition and DM, along with option of meeting with nutritionist. - Pt states that he has been doing more gardening lately and walking more in [place]. States he has been thinking about doing 10-15 mins/night to exercise. He reports fasting morning readings of 180 which he attributes to eating an orange every morning. Pt reports blood glucose readings of 98-112 when he comes home from work. Pt reports occasional dizziness that he attributes to vertigo. Denies any hypoglycemic episodes, cp, sob, any other symptoms.  H/o Diabetic Neuropathy See prior visit. Gabapentin 300 mg. Pt states that he used to take 1 tab in the morning, 2 at night. States burning/tingling in his feet have worsened over the past 2 wks and tends to occur towards the end of the work wk. He states that he probably needs new work shoes. Pt recently started taking a second tab around 5 PM when he comes home from work x 2 wks. Denies symptoms in his hands.  Health Maintenance Pneumovax: due Foot exam: 04/29/17 Urine micro albumin: 04/29/17 Optho: 07/11/17 Wt Readings from Last 3 Encounters:  01/30/18 273 lb 12.8 oz (124.2 kg)  12/17/17 275 lb (124.7 kg)  10/31/17 274 lb (124.3 kg)  Body mass index is 34.22  kg/m.  Lipid Screening Lab Results  Component Value Date   CHOL 155 07/25/2017   HDL 41 07/25/2017   LDLCALC 87 07/25/2017   TRIG 137 07/25/2017   CHOLHDL 3.8 07/25/2017   Lab Results  Component Value Date   ALT 20 12/17/2017   AST 22 12/17/2017   ALKPHOS 46 12/17/2017   BILITOT 0.7 12/17/2017  Denies myalgias or any other symptoms.  Patient Active Problem List   Diagnosis Date Noted  . Diabetic nephropathy ([place]) 05/01/2017  . Diabetes ([place]) 05/01/2017  . Peripheral neuropathy 05/01/2017   No past medical history on file. No past surgical history on file. No Known Allergies Prior to Admission medications   Medication Sig Start Date End Date Taking? Authorizing Provider  atorvastatin (LIPITOR) 10 MG tablet Take 1 tablet (10 mg total) by mouth daily. 10/31/17   Wendie Agreste, MD  blood glucose meter kit and supplies Dispense based on patient and insurance preference. Check twice per day. FOR ICD-9 250.00, 250.01). 04/29/17   Wendie Agreste, MD  gabapentin (NEURONTIN) 300 MG capsule 318m QAM,  6028mQHS. 10/31/17   GrWendie AgresteMD  glipiZIDE (GLUCOTROL) 5 MG tablet Take 1 tablet (5 mg total) by mouth 2 (two) times daily before a meal. 11/03/17   GrWendie AgresteMD  metFORMIN (GLUCOPHAGE) 500 MG tablet Take 1 tablet (500 mg total) by mouth 2 (two) times daily with a meal. 10/31/17  Wendie Agreste, MD   Social History   Socioeconomic History  . Marital status: Married    Spouse name: Not on file  . Number of children: Not on file  . Years of education: Not on file  . Highest education level: Not on file  Occupational History  . Not on file  Social Needs  . Financial resource strain: Not on file  . Food insecurity:    Worry: Not on file    Inability: Not on file  . Transportation needs:    Medical: Not on file    Non-medical: Not on file  Tobacco Use  . Smoking status: Never Smoker  . Smokeless tobacco: Never Used  Substance and Sexual Activity  .  Alcohol use: Yes    Comment: occ  . Drug use: No  . Sexual activity: Yes  Lifestyle  . Physical activity:    Days per week: Not on file    Minutes per session: Not on file  . Stress: Not on file  Relationships  . Social connections:    Talks on phone: Not on file    Gets together: Not on file    Attends religious service: Not on file    Active member of club or organization: Not on file    Attends meetings of clubs or organizations: Not on file    Relationship status: Not on file  . Intimate partner violence:    Fear of current or ex partner: Not on file    Emotionally abused: Not on file    Physically abused: Not on file    Forced sexual activity: Not on file  Other Topics Concern  . Not on file  Social History Narrative  . Not on file   Review of Systems  Respiratory: Negative for shortness of breath.   Cardiovascular: Negative for chest pain.  Musculoskeletal: Negative for myalgias.  Neurological: Positive for dizziness (occasional) and numbness (feet). Negative for light-headedness.      Objective:   Physical Exam  Constitutional: He is oriented to person, place, and time. He appears well-developed and well-nourished.  HENT:  Head: Normocephalic and atraumatic.  Eyes: Pupils are equal, round, and reactive to light. EOM are normal.  Neck: No JVD present. Carotid bruit is not present.  Cardiovascular: Normal rate, regular rhythm and normal heart sounds.  No murmur heard. Pulmonary/Chest: Effort normal and breath sounds normal. He has no rales.  Musculoskeletal: He exhibits edema (trace pedal).  Neurological: He is alert and oriented to person, place, and time.  Skin: Skin is warm and dry.  Psychiatric: He has a normal mood and affect.  Vitals reviewed.  Vitals:   01/30/18 1647  BP: 122/70  Pulse: 99  Temp: 98.1 F (36.7 C)  TempSrc: Oral  SpO2: 97%  Weight: 273 lb 12.8 oz (124.2 kg)  Height: '6\' 3"'  (1.905 m)      Assessment & Plan:   David Crane  is a 52 y.o. male Other polyneuropathy - Plan: Vitamin B12  -Check B12, but suspected diabetic neuropathy option of increasing doses of gabapentin discussed with potential side effects reviewed.  Type 2 diabetes mellitus with diabetic neuropathy, with long-term current use of insulin ([place]) - Plan: Hemoglobin A1c  -Commended on increasing exercise and watching diet, suspect he will have continued weight loss and improved glycemic control.  Continue same dose of glipizide and metformin for now, with option of going to 850 mg twice daily of metformin depending on A1c.  Ideally I  would like to see his A1c under 6.5 with fairly recent diagnosis of diabetes.   Hyperlipidemia, unspecified hyperlipidemia type  -Tolerating Lipitor, continue same dose.  Plan for fasting lipids at next visit as not currently fasting.  LFTs were stable last visit.  No orders of the defined types were placed in this encounter.  Patient Instructions   Keep up the good work with increasing exercise. Work toward 150 minutes per week total. Low intensity exercise to begin with, and slowly increase.  For foot pain can increase gabapentin up to 4 per day if needed. Watch for any increased dizziness with higher doses.   I will check your blood sugar average test today, cholesterol at next visit.   No change in other meds for now.  We may try higher dose of metformin if needed. I will let you know.   Follow up in 3 months - make sure you are fasting for 8 hours at visit. (do not take glipizide if you are fasting).   Thanks for coming in today.   Type 2 Diabetes Mellitus, Self Care, Adult When you have type 2 diabetes (type 2 diabetes mellitus), you must keep your blood sugar (glucose) under control. You can do this with:  Nutrition.  Exercise.  Lifestyle changes.  Medicines or insulin, if needed.  Support from your doctors and others.  How do I manage my blood sugar?  Check your blood sugar level every day, as  often as told.  Call your doctor if your blood sugar is above your goal numbers for 2 tests in a row.  Have your A1c (hemoglobin A1c) level checked at least two times a year. Have it checked more often if your doctor tells you to. Your doctor will set treatment goals for you. Generally, you should have these blood sugar levels:  Before meals (preprandial): 80-130 mg/dL (4.4-7.2 mmol/L).  After meals (postprandial): lower than 180 mg/dL (10 mmol/L).  A1c level: less than 7%.  What do I need to know about high blood sugar? High blood sugar is called hyperglycemia. Know the signs of high blood sugar. Signs may include:  Feeling: ? Thirsty. ? Hungry. ? Very tired.  Needing to pee (urinate) more than usual.  Blurry vision.  What do I need to know about low blood sugar? Low blood sugar is called hypoglycemia. This is when blood sugar is at or below 70 mg/dL (3.9 mmol/L). Symptoms may include:  Feeling: ? Hungry. ? Worried or nervous (anxious). ? Sweaty and clammy. ? Confused. ? Dizzy. ? Sleepy. ? Sick to your stomach (nauseous).  Having: ? A fast heartbeat (palpitations). ? A headache. ? A change in your vision. ? Jerky movements that you cannot control (seizure). ? Nightmares. ? Tingling or no feeling (numbness) around the mouth, lips, or tongue.  Having trouble with: ? Talking. ? Paying attention (concentrating). ? Moving (coordination). ? Sleeping.  Shaking.  Passing out (fainting).  Getting upset easily (irritability).  Treating low blood sugar  To treat low blood sugar, eat or drink something sugary right away. If you can think clearly and swallow safely, follow the 15:15 rule:  Take 15 grams of a fast-acting carb (carbohydrate). Some fast-acting carbs are: ? 1 tube of glucose gel. ? 3 sugar tablets (glucose pills). ? 6-8 pieces of hard candy. ? 4 oz (120 mL) of fruit juice. ? 4 oz (120 mL) regular (not diet) soda.  Check your blood sugar 15 minutes  after you take the carb.  If your  blood sugar is still at or below 70 mg/dL (3.9 mmol/L), take 15 grams of a carb again.  If your blood sugar does not go above 70 mg/dL (3.9 mmol/L) after 3 tries, get help right away.  After your blood sugar goes back to normal, eat a meal or a snack within 1 hour.  Treating very low blood sugar If your blood sugar is at or below 54 mg/dL (3 mmol/L), you have very low blood sugar (severe hypoglycemia). This is an emergency. Do not wait to see if the symptoms will go away. Get medical help right away. Call your local emergency services (911 in the [place] Do not drive yourself to the hospital. If you have very low blood sugar and you cannot eat or drink, you may need a glucagon shot (injection). A family member or friend should learn how to check your blood sugar and how to give you a glucagon shot. Ask your doctor if you need to have a glucagon shot kit at home. What else is important to manage my diabetes? Medicine Follow these instructions about insulin and diabetes medicines:  Take them as told by your doctor.  Adjust them as told by your doctor.  Do not run out of them.  Having diabetes can raise your risk for other long-term conditions. These include heart or kidney disease. Your doctor may prescribe medicines to help prevent problems from diabetes. Food   Make healthy food choices. These include: ? Chicken, fish, egg whites, and beans. ? Oats, whole wheat, bulgur, brown rice, quinoa, and millet. ? Fresh fruits and vegetables. ? Low-fat dairy products. ? Nuts, avocado, olive oil, and canola oil.  Make a food plan with a specialist (dietitian).  Follow instructions from your doctor about what you cannot eat or drink.  Drink enough fluid to keep your pee (urine) clear or pale yellow.  Eat healthy snacks between healthy meals.  Keep track of carbs that you eat. Read food labels. Learn food serving sizes.  Follow your sick day plan when you  cannot eat or drink normally. Make this plan with your doctor so it is ready to use. Activity  Exercise at least 3 times a week.  Do not go more than 2 days without exercising.  Talk with your doctor before you start a new exercise. Your doctor may need to adjust your insulin, medicines, or food. Lifestyle   Do not use any tobacco products. These include cigarettes, chewing tobacco, and e-cigarettes.If you need help quitting, ask your doctor.  Ask your doctor how much alcohol is safe for you.  Learn to deal with stress. If you need help with this, ask your doctor. Body care  Stay up to date with your shots (immunizations).  Have your eyes and feet checked by a doctor as often as told.  Check your skin and feet every day. Check for cuts, bruises, redness, blisters, or sores.  Brush your teeth and gums two times a day.  Floss at least one time a day.  Go to the dentist least one time every 6 months.  Stay at a healthy weight. General instructions   Take over-the-counter and prescription medicines only as told by your doctor.  Share your diabetes care plan with: ? Your work or school. ? People you live with.  Check your pee (urine) for ketones: ? When you are sick. ? As told by your doctor.  Carry a card or wear jewelry that says that you have diabetes.  Ask your doctor: ?  Do I need to meet with a diabetes educator? ? Where can I find a support group for people with diabetes?  Keep all follow-up visits as told by your doctor. This is important. Where to find more information: To learn more about diabetes, visit:  American Diabetes Association: www.diabetes.org  American Association of Diabetes Educators: www.diabeteseducator.org/patient-resources  This information is not intended to replace advice given to you by your health care provider. Make sure you discuss any questions you have with your health care provider. Document Released: 12/26/2015 Document  Revised: 02/09/2016 Document Reviewed: 10/07/2015 Elsevier Interactive Patient Education  2018 [employer].   IF you received an x-ray today, you will receive an invoice from [facility]. Please contact [facility] at [phone] with questions or concerns regarding your invoice.   IF you received labwork today, you will receive an invoice from [place]. Please contact [facility] at [phone] with questions or concerns regarding your invoice.   Our billing staff will not be able to assist you with questions regarding bills from these companies.  You will be contacted with the lab results as soon as they are available. The fastest way to get your results is to activate your My Chart account. Instructions are located on the last page of this paperwork. If you have not heard from [place] regarding the results in 2 weeks, please contact this office.       I personally performed the services described in this documentation, which was scribed in my presence. The recorded information has been reviewed and considered for accuracy and completeness, addended by me as needed, and agree with information above.  Signed,   [name], MD [facility] [place].  01/30/18 5:58 PM

## 2018-01-30 NOTE — Patient Instructions (Addendum)
Keep up the good work with increasing exercise. Work toward 150 minutes per week total. Low intensity exercise to begin with, and slowly increase.  For foot pain can increase gabapentin up to 4 per day if needed. Watch for any increased dizziness with higher doses.   I will check your blood sugar average test today, cholesterol at next visit.   No change in other meds for now.  We may try higher dose of metformin if needed. I will let you know.   Follow up in 3 months - make sure you are fasting for 8 hours at visit. (do not take glipizide if you are fasting).   Thanks for coming in today.   Type 2 Diabetes Mellitus, Self Care, Adult When you have type 2 diabetes (type 2 diabetes mellitus), you must keep your blood sugar (glucose) under control. You can do this with:  Nutrition.  Exercise.  Lifestyle changes.  Medicines or insulin, if needed.  Support from your doctors and others.  How do I manage my blood sugar?  Check your blood sugar level every day, as often as told.  Call your doctor if your blood sugar is above your goal numbers for 2 tests in a row.  Have your A1c (hemoglobin A1c) level checked at least two times a year. Have it checked more often if your doctor tells you to. Your doctor will set treatment goals for you. Generally, you should have these blood sugar levels:  Before meals (preprandial): 80-130 mg/dL (4.4-7.2 mmol/L).  After meals (postprandial): lower than 180 mg/dL (10 mmol/L).  A1c level: less than 7%.  What do I need to know about high blood sugar? High blood sugar is called hyperglycemia. Know the signs of high blood sugar. Signs may include:  Feeling: ? Thirsty. ? Hungry. ? Very tired.  Needing to pee (urinate) more than usual.  Blurry vision.  What do I need to know about low blood sugar? Low blood sugar is called hypoglycemia. This is when blood sugar is at or below 70 mg/dL (3.9 mmol/L). Symptoms may  include:  Feeling: ? Hungry. ? Worried or nervous (anxious). ? Sweaty and clammy. ? Confused. ? Dizzy. ? Sleepy. ? Sick to your stomach (nauseous).  Having: ? A fast heartbeat (palpitations). ? A headache. ? A change in your vision. ? Jerky movements that you cannot control (seizure). ? Nightmares. ? Tingling or no feeling (numbness) around the mouth, lips, or tongue.  Having trouble with: ? Talking. ? Paying attention (concentrating). ? Moving (coordination). ? Sleeping.  Shaking.  Passing out (fainting).  Getting upset easily (irritability).  Treating low blood sugar  To treat low blood sugar, eat or drink something sugary right away. If you can think clearly and swallow safely, follow the 15:15 rule:  Take 15 grams of a fast-acting carb (carbohydrate). Some fast-acting carbs are: ? 1 tube of glucose gel. ? 3 sugar tablets (glucose pills). ? 6-8 pieces of hard candy. ? 4 oz (120 mL) of fruit juice. ? 4 oz (120 mL) regular (not diet) soda.  Check your blood sugar 15 minutes after you take the carb.  If your blood sugar is still at or below 70 mg/dL (3.9 mmol/L), take 15 grams of a carb again.  If your blood sugar does not go above 70 mg/dL (3.9 mmol/L) after 3 tries, get help right away.  After your blood sugar goes back to normal, eat a meal or a snack within 1 hour.  Treating very low blood sugar  If your blood sugar is at or below 54 mg/dL (3 mmol/L), you have very low blood sugar (severe hypoglycemia). This is an emergency. Do not wait to see if the symptoms will go away. Get medical help right away. Call your local emergency services (911 in the [place] Do not drive yourself to the hospital. If you have very low blood sugar and you cannot eat or drink, you may need a glucagon shot (injection). A family member or friend should learn how to check your blood sugar and how to give you a glucagon shot. Ask your doctor if you need to have a glucagon shot kit at  home. What else is important to manage my diabetes? Medicine Follow these instructions about insulin and diabetes medicines:  Take them as told by your doctor.  Adjust them as told by your doctor.  Do not run out of them.  Having diabetes can raise your risk for other long-term conditions. These include heart or kidney disease. Your doctor may prescribe medicines to help prevent problems from diabetes. Food   Make healthy food choices. These include: ? Chicken, fish, egg whites, and beans. ? Oats, whole wheat, bulgur, brown rice, quinoa, and millet. ? Fresh fruits and vegetables. ? Low-fat dairy products. ? Nuts, avocado, olive oil, and canola oil.  Make a food plan with a specialist (dietitian).  Follow instructions from your doctor about what you cannot eat or drink.  Drink enough fluid to keep your pee (urine) clear or pale yellow.  Eat healthy snacks between healthy meals.  Keep track of carbs that you eat. Read food labels. Learn food serving sizes.  Follow your sick day plan when you cannot eat or drink normally. Make this plan with your doctor so it is ready to use. Activity  Exercise at least 3 times a week.  Do not go more than 2 days without exercising.  Talk with your doctor before you start a new exercise. Your doctor may need to adjust your insulin, medicines, or food. Lifestyle   Do not use any tobacco products. These include cigarettes, chewing tobacco, and e-cigarettes.If you need help quitting, ask your doctor.  Ask your doctor how much alcohol is safe for you.  Learn to deal with stress. If you need help with this, ask your doctor. Body care  Stay up to date with your shots (immunizations).  Have your eyes and feet checked by a doctor as often as told.  Check your skin and feet every day. Check for cuts, bruises, redness, blisters, or sores.  Brush your teeth and gums two times a day.  Floss at least one time a day.  Go to the dentist  least one time every 6 months.  Stay at a healthy weight. General instructions   Take over-the-counter and prescription medicines only as told by your doctor.  Share your diabetes care plan with: ? Your work or school. ? People you live with.  Check your pee (urine) for ketones: ? When you are sick. ? As told by your doctor.  Carry a card or wear jewelry that says that you have diabetes.  Ask your doctor: ? Do I need to meet with a diabetes educator? ? Where can I find a support group for people with diabetes?  Keep all follow-up visits as told by your doctor. This is important. Where to find more information: To learn more about diabetes, visit:  American Diabetes Association: www.diabetes.org  American Association of Diabetes Educators: www.diabeteseducator.org/patient-resources  This information  is not intended to replace advice given to you by your health care provider. Make sure you discuss any questions you have with your health care provider. Document Released: 12/26/2015 Document Revised: 02/09/2016 Document Reviewed: 10/07/2015 Elsevier Interactive Patient Education  2018 [employer].   IF you received an x-ray today, you will receive an invoice from [facility]. Please contact [facility] at [phone] with questions or concerns regarding your invoice.   IF you received labwork today, you will receive an invoice from [place]. Please contact [facility] at [phone] with questions or concerns regarding your invoice.   Our billing staff will not be able to assist you with questions regarding bills from these companies.  You will be contacted with the lab results as soon as they are available. The fastest way to get your results is to activate your My Chart account. Instructions are located on the last page of this paperwork. If you have not heard from [place] regarding the results in 2 weeks, please contact this office.

## 2018-01-31 LAB — VITAMIN B12: VITAMIN B 12: 486 pg/mL (ref 232–1245)

## 2018-01-31 LAB — HEMOGLOBIN A1C
Est. average glucose Bld gHb Est-mCnc: 174 mg/dL
HEMOGLOBIN A1C: 7.7 % — AB (ref 4.8–5.6)

## 2018-02-10 ENCOUNTER — Other Ambulatory Visit: Payer: Self-pay | Admitting: Family Medicine

## 2018-02-10 DIAGNOSIS — R809 Proteinuria, unspecified: Principal | ICD-10-CM

## 2018-02-10 DIAGNOSIS — E1129 Type 2 diabetes mellitus with other diabetic kidney complication: Secondary | ICD-10-CM

## 2018-02-10 MED ORDER — METFORMIN HCL 850 MG PO TABS: 850.0000 mg | ORAL_TABLET | Freq: Two times a day (BID) | ORAL | 1 refills | Status: DC

## 2018-02-10 NOTE — Progress Notes (Signed)
See mychart lab message.

## 2018-02-11 ENCOUNTER — Ambulatory Visit: Payer: BLUE CROSS/BLUE SHIELD | Admitting: Family Medicine

## 2018-02-11 ENCOUNTER — Encounter: Payer: Self-pay | Admitting: Family Medicine

## 2018-02-11 VITALS — BP 126/78 | HR 110 | Temp 99.2°F | Resp 17 | Ht 75.0 in | Wt 276.0 lb

## 2018-02-11 DIAGNOSIS — S90221A Contusion of right lesser toe(s) with damage to nail, initial encounter: Secondary | ICD-10-CM

## 2018-02-11 NOTE — Progress Notes (Signed)
Subjective:  By signing my name below, I, [name], attest that this documentation has been prepared under the direction and in the presence of [name], MD. Electronically Signed: [name], [place]. 02/11/2018 , 6:16 PM .  Patient was seen in Room 14 .   Patient ID: IORI GIGANTE, male    DOB: [birth year], 52 y.o.   MRN: [id number] Chief Complaint  Patient presents with  . Toe Injury   HPI DONEVIN SAINSBURY is a 52 y.o. male  Patient states he went to a race about 10 days ago and walked around all day, which is more walking than he does usually. He reports it was a very hot day, wearing tennis shoes and socks. After he returned home, he took his socks off and noticed right great toe with discolored toenail. Initially, he noticed redness surrounding the toenail, which improved after applying antibiotic cream. He notes toenail was sensitive. He denies any discharge or pus from his toe. He believes he might've kicked it into something, though doubts being stepped on. He denies wearing any new shoes. He mentions noticing a blister over the side of the nail fold, but this had improved.   Patient Active Problem List   Diagnosis Date Noted  . Diabetic nephropathy ([place]) 05/01/2017  . Diabetes ([place]) 05/01/2017  . Peripheral neuropathy 05/01/2017   No past medical history on file. No past surgical history on file. No Known Allergies Prior to Admission medications   Medication Sig Start Date End Date Taking? Authorizing Provider  atorvastatin (LIPITOR) 10 MG tablet Take 1 tablet (10 mg total) by mouth daily. 01/30/18   Wendie Agreste, MD  blood glucose meter kit and supplies Dispense based on patient and insurance preference. Check twice per day. FOR ICD-9 250.00, 250.01). 04/29/17   Wendie Agreste, MD  gabapentin (NEURONTIN) 300 MG capsule 31m QAM,  6019mQHS. 10/31/17   GrWendie AgresteMD  glipiZIDE (GLUCOTROL) 5 MG tablet Take 1 tablet (5 mg total) by mouth 2 (two)  times daily before a meal. 11/03/17   GrWendie AgresteMD  metFORMIN (GLUCOPHAGE) 850 MG tablet Take 1 tablet (850 mg total) by mouth 2 (two) times daily with a meal. 02/10/18   GrWendie AgresteMD   Social History   Socioeconomic History  . Marital status: Married    Spouse name: Not on file  . Number of children: Not on file  . Years of education: Not on file  . Highest education level: Not on file  Occupational History  . Not on file  Social Needs  . Financial resource strain: Not on file  . Food insecurity:    Worry: Not on file    Inability: Not on file  . Transportation needs:    Medical: Not on file    Non-medical: Not on file  Tobacco Use  . Smoking status: Never Smoker  . Smokeless tobacco: Never Used  Substance and Sexual Activity  . Alcohol use: Yes    Comment: occ  . Drug use: No  . Sexual activity: Yes  Lifestyle  . Physical activity:    Days per week: Not on file    Minutes per session: Not on file  . Stress: Not on file  Relationships  . Social connections:    Talks on phone: Not on file    Gets together: Not on file    Attends religious service: Not on file    Active member of club or organization:  Not on file    Attends meetings of clubs or organizations: Not on file    Relationship status: Not on file  . Intimate partner violence:    Fear of current or ex partner: Not on file    Emotionally abused: Not on file    Physically abused: Not on file    Forced sexual activity: Not on file  Other Topics Concern  . Not on file  Social History Narrative  . Not on file   Review of Systems  Constitutional: Negative for fatigue and unexpected weight change.  Eyes: Negative for visual disturbance.  Respiratory: Negative for cough, chest tightness and shortness of breath.   Cardiovascular: Negative for chest pain, palpitations and leg swelling.  Gastrointestinal: Negative for abdominal pain and blood in stool.  Musculoskeletal: Positive for arthralgias.    Skin: Negative for rash and wound.       Discolored toenail (right great toe)  Neurological: Negative for dizziness, light-headedness and headaches.       Objective:   Physical Exam  Constitutional: He is oriented to person, place, and time. He appears well-developed and well-nourished. No distress.  HENT:  Head: Normocephalic and atraumatic.  Eyes: Pupils are equal, round, and reactive to light. EOM are normal.  Neck: Neck supple.  Cardiovascular: Normal rate.  Pulmonary/Chest: Effort normal. No respiratory distress.  Musculoskeletal: Normal range of motion.  Neurological: He is alert and oriented to person, place, and time.  Skin: Skin is warm and dry.  Right great toe: minimal soft tissue swelling at the proximal nail fold bilaterally, toenail appears to be slightly lifted with complete subungual hematoma although attached proximally without lifting  Psychiatric: He has a normal mood and affect. His behavior is normal.  Nursing note and vitals reviewed.   Vitals:   02/11/18 1733  BP: 126/78  Pulse: (!) 110  Resp: 17  Temp: 99.2 F (37.3 C)  TempSrc: Oral  SpO2: 98%  Weight: 276 lb (125.2 kg)  Height: '6\' 3"'  (1.905 m)       Assessment & Plan:    PRINCETON NABOR is a 52 y.o. male Subungual hematoma of toe of right foot, initial encounter  -Suspected subungual hematoma of the great toe after repetitive trauma of walking at the wrist.  May have early  Onycholysis but appears to have intact skin peripherally at this time.  No signs of paronychia, no discharge, no bony tenderness.  Imaging was deferred.  Based on timing since injury, would not trephinate or open area at this time.  -discussed potential for nail onycholysis, new nail formation/deformation was discussed.  With history of diabetes stressed importance of keeping nail clean, covered, especially if it starts to drain.  RTC precautions discussed if any worsening or signs of secondary infection.  No orders of the  defined types were placed in this encounter.  Patient Instructions    It appears that you likely had some trauma to your great toenail that resulted in bruising below the toenail and now may be progressing towards onycholysis (possible separation of toenail from the skin below).  There does not appear to be any sign of infection at this time, but watch that area closely for any redness or new discharge.  If it does start to drain,  keep it clean with soap and water cleansing twice per day, and covered.  Recheck if any signs of infection such as draining pus, redness, increasing pain, or other concerns.   Subungual Hematoma A subungual hematoma, sometimes  called runner's toe or tennis toe, is a collection of blood under a fingernail or toenail. What are the causes? This condition is caused by a crush injury to the finger or toe that breaks a blood vessel beneath the nail. It can develop after a hard, direct hit to a finger or toe, or after pressure is repeatedly put on an injured finger or toe. What are the signs or symptoms? Symptoms of this condition include:  A blue or dark blue color under the nail.  Pain or throbbing in the injured area.  How is this diagnosed? This condition is diagnosed with a medical history and a physical exam. How is this treated? Usually treatment is not needed for this condition. It usually goes away with time. If the condition is causing a lot of pain, or if a lot of blood collects under the fingernail or toenail, a health care provider may perform a painless procedure to drain the blood from beneath the nail. If there is a cut under the nail that requires stitches (sutures), the nail may be removed. Follow these instructions at home:  If directed, apply ice to the injured area: ? Put ice in a plastic bag. ? Place a towel between your skin and the bag. ? Leave the ice on for 20 minutes, 2-3 times per day.  Raise (elevate) the injured finger or toe above  the level of your heart while you are sitting or lying down. This will help to decrease pain and swelling.  If part of your nail falls off, gently trim the remaining nail. This prevents the remaining nail from catching on something and causing further injury.  Follow instructions from your health care provider about how to take care of your injury. Make sure you: ? Wash your hands with soap and water before you change your bandage (dressing). If soap and water are not available, use hand sanitizer. ? Change your dressing as told by your health care provider. ? Leave stitches (sutures) in place. You may have these if your health care provider repaired a cut under the nail. The sutures may need to stay in place for 2 weeks or longer.  Take over-the-counter and prescription medicines only as told by your health care provider.  Keep all follow-up visits. This is important. If you had a procedure to drain the blood from under your nail, follow up with your health care provider as told. Contact a health care provider if:  Your pain is not controlled with medicine.  You have redness, swelling, or pain around your nail. Get help right away if:  You have fluid, blood, or pus coming from your nail.  You have a fever. This information is not intended to replace advice given to you by your health care provider. Make sure you discuss any questions you have with your health care provider. Document Released: 08/31/2000 Document Revised: 05/02/2016 Document Reviewed: 01/19/2015 Elsevier Interactive Patient Education  2018 [employer].    IF you received an x-ray today, you will receive an invoice from [facility]. Please contact [facility] at [phone] with questions or concerns regarding your invoice.   IF you received labwork today, you will receive an invoice from [facility]. Please contact [facility] at [phone] with questions or concerns regarding your invoice.   Our  billing staff will not be able to assist you with questions regarding bills from these companies.  You will be contacted with the lab results as soon as they are available. The fastest  way to get your results is to activate your My Chart account. Instructions are located on the last page of this paperwork. If you have not heard from [place] regarding the results in 2 weeks, please contact this office.       I personally performed the services described in this documentation, which was scribed in my presence. The recorded information has been reviewed and considered for accuracy and completeness, addended by me as needed, and agree with information above.  Signed,   [name], MD [facility].  02/11/18 6:59 PM

## 2018-02-11 NOTE — Patient Instructions (Addendum)
It appears that you likely had some trauma to your great toenail that resulted in bruising below the toenail and now may be progressing towards onycholysis (possible separation of toenail from the skin below).  There does not appear to be any sign of infection at this time, but watch that area closely for any redness or new discharge.  If it does start to drain,  keep it clean with soap and water cleansing twice per day, and covered.  Recheck if any signs of infection such as draining pus, redness, increasing pain, or other concerns.   Subungual Hematoma A subungual hematoma, sometimes called runner's toe or tennis toe, is a collection of blood under a fingernail or toenail. What are the causes? This condition is caused by a crush injury to the finger or toe that breaks a blood vessel beneath the nail. It can develop after a hard, direct hit to a finger or toe, or after pressure is repeatedly put on an injured finger or toe. What are the signs or symptoms? Symptoms of this condition include:  A blue or dark blue color under the nail.  Pain or throbbing in the injured area.  How is this diagnosed? This condition is diagnosed with a medical history and a physical exam. How is this treated? Usually treatment is not needed for this condition. It usually goes away with time. If the condition is causing a lot of pain, or if a lot of blood collects under the fingernail or toenail, a health care provider may perform a painless procedure to drain the blood from beneath the nail. If there is a cut under the nail that requires stitches (sutures), the nail may be removed. Follow these instructions at home:  If directed, apply ice to the injured area: ? Put ice in a plastic bag. ? Place a towel between your skin and the bag. ? Leave the ice on for 20 minutes, 2-3 times per day.  Raise (elevate) the injured finger or toe above the level of your heart while you are sitting or lying down. This will help  to decrease pain and swelling.  If part of your nail falls off, gently trim the remaining nail. This prevents the remaining nail from catching on something and causing further injury.  Follow instructions from your health care provider about how to take care of your injury. Make sure you: ? Wash your hands with soap and water before you change your bandage (dressing). If soap and water are not available, use hand sanitizer. ? Change your dressing as told by your health care provider. ? Leave stitches (sutures) in place. You may have these if your health care provider repaired a cut under the nail. The sutures may need to stay in place for 2 weeks or longer.  Take over-the-counter and prescription medicines only as told by your health care provider.  Keep all follow-up visits. This is important. If you had a procedure to drain the blood from under your nail, follow up with your health care provider as told. Contact a health care provider if:  Your pain is not controlled with medicine.  You have redness, swelling, or pain around your nail. Get help right away if:  You have fluid, blood, or pus coming from your nail.  You have a fever. This information is not intended to replace advice given to you by your health care provider. Make sure you discuss any questions you have with your health care provider. Document Released: 08/31/2000 Document Revised:  05/02/2016 Document Reviewed: 01/19/2015 Elsevier Interactive Patient Education  2018 [employer].    IF you received an x-ray today, you will receive an invoice from [facility]. Please contact [facility] at [phone] with questions or concerns regarding your invoice.   IF you received labwork today, you will receive an invoice from [place]. Please contact [facility] at [phone] with questions or concerns regarding your invoice.   Our billing staff will not be able to assist you with questions regarding bills from  these companies.  You will be contacted with the lab results as soon as they are available. The fastest way to get your results is to activate your My Chart account. Instructions are located on the last page of this paperwork. If you have not heard from [place] regarding the results in 2 weeks, please contact this office.

## 2018-05-13 ENCOUNTER — Ambulatory Visit: Payer: BLUE CROSS/BLUE SHIELD | Admitting: Family Medicine

## 2018-05-13 ENCOUNTER — Encounter: Payer: Self-pay | Admitting: Family Medicine

## 2018-05-13 ENCOUNTER — Encounter: Payer: Self-pay | Admitting: Internal Medicine

## 2018-05-13 VITALS — BP 122/84 | HR 120 | Temp 98.0°F | Resp 17 | Ht 75.0 in | Wt 270.0 lb

## 2018-05-13 DIAGNOSIS — R809 Proteinuria, unspecified: Secondary | ICD-10-CM

## 2018-05-13 DIAGNOSIS — E1129 Type 2 diabetes mellitus with other diabetic kidney complication: Secondary | ICD-10-CM | POA: Diagnosis not present

## 2018-05-13 DIAGNOSIS — E785 Hyperlipidemia, unspecified: Secondary | ICD-10-CM

## 2018-05-13 DIAGNOSIS — E1142 Type 2 diabetes mellitus with diabetic polyneuropathy: Secondary | ICD-10-CM | POA: Diagnosis not present

## 2018-05-13 DIAGNOSIS — Z1211 Encounter for screening for malignant neoplasm of colon: Secondary | ICD-10-CM

## 2018-05-13 DIAGNOSIS — G629 Polyneuropathy, unspecified: Secondary | ICD-10-CM

## 2018-05-13 DIAGNOSIS — M25521 Pain in right elbow: Secondary | ICD-10-CM

## 2018-05-13 LAB — COMPREHENSIVE METABOLIC PANEL
ALT: 23 IU/L (ref 0–44)
AST: 20 IU/L (ref 0–40)
Albumin/Globulin Ratio: 2 (ref 1.2–2.2)
Albumin: 4.9 g/dL (ref 3.5–5.5)
Alkaline Phosphatase: 50 IU/L (ref 39–117)
BUN/Creatinine Ratio: 19 (ref 9–20)
BUN: 22 mg/dL (ref 6–24)
Bilirubin Total: 0.9 mg/dL (ref 0.0–1.2)
CALCIUM: 10.2 mg/dL (ref 8.7–10.2)
CO2: 23 mmol/L (ref 20–29)
CREATININE: 1.15 mg/dL (ref 0.76–1.27)
Chloride: 100 mmol/L (ref 96–106)
GFR calc Af Amer: 84 mL/min/{1.73_m2} (ref >59)
GFR, EST NON AFRICAN AMERICAN: 73 mL/min/{1.73_m2} (ref >59)
Globulin, Total: 2.5 g/dL (ref 1.5–4.5)
Glucose: 183 mg/dL — ABNORMAL HIGH (ref 65–99)
Potassium: 5.2 mmol/L (ref 3.5–5.2)
Sodium: 141 mmol/L (ref 134–144)
Total Protein: 7.4 g/dL (ref 6.0–8.5)

## 2018-05-13 LAB — HEMOGLOBIN A1C
Est. average glucose Bld gHb Est-mCnc: 163 mg/dL
Hgb A1c MFr Bld: 7.3 % — ABNORMAL HIGH (ref 4.8–5.6)

## 2018-05-13 LAB — LIPID PANEL
CHOL/HDL RATIO: 2.8 ratio (ref 0.0–5.0)
Cholesterol, Total: 120 mg/dL (ref 100–199)
HDL: 43 mg/dL (ref >39)
LDL CALC: 53 mg/dL (ref 0–99)
TRIGLYCERIDES: 118 mg/dL (ref 0–149)
VLDL Cholesterol Cal: 24 mg/dL (ref 5–40)

## 2018-05-13 MED ORDER — GABAPENTIN 300 MG PO CAPS: ORAL_CAPSULE | ORAL | 1 refills | Status: DC

## 2018-05-13 MED ORDER — METFORMIN HCL 850 MG PO TABS: 850.0000 mg | ORAL_TABLET | Freq: Two times a day (BID) | ORAL | 1 refills | Status: DC

## 2018-05-13 MED ORDER — ATORVASTATIN CALCIUM 10 MG PO TABS: 10.0000 mg | ORAL_TABLET | Freq: Every day | ORAL | 1 refills | Status: DC

## 2018-05-13 MED ORDER — GLIPIZIDE 5 MG PO TABS: 5.0000 mg | ORAL_TABLET | Freq: Two times a day (BID) | ORAL | 1 refills | Status: DC

## 2018-05-13 NOTE — Progress Notes (Signed)
Subjective:  By signing my name below, I, Essence Howell, attest that this documentation has been prepared under the direction and in the presence of Wendie Agreste, MD Electronically Signed: Ladene Artist, ED Scribe 05/13/2018 at 8:16 AM.   Patient ID: David Crane, male    DOB: [birth year], 52 y.o.   MRN: [id number]  Chief Complaint  Patient presents with  . Follow-up    diabetes   HPI David Crane is a 52 y.o. male who presents to Primary Care at [facility] for f/u.  DM Complicated with Peripheral Neuropathy and Microalbuminuria Lab Results  Component Value Date   HGBA1C 7.7 (H) 01/30/2018  Metformin 850 mg bid, glipizide 5 mg bid. Symptoms of peripheral neuropathy treated with gabapentin 300 mg qam, 600 mg qhs. Urine micro 04/2017 elevated at 611. Metformin increased in May. - Reports some diarrhea when he initially started Metformin which has resolved. Pt has been trying to be more physically active. Reports burning and numbness in feet have resolved with gabapentin. Denies side-effects. Wt Readings from Last 3 Encounters:  05/13/18 270 lb (122.5 kg)  02/11/18 276 lb (125.2 kg)  01/30/18 273 lb 12.8 oz (124.2 kg)   Hyperlipidemia Lab Results  Component Value Date   CHOL 155 07/25/2017   HDL 41 07/25/2017   LDLCALC 87 07/25/2017   TRIG 137 07/25/2017   CHOLHDL 3.8 07/25/2017   Lab Results  Component Value Date   ALT 20 12/17/2017   AST 22 12/17/2017   ALKPHOS 46 12/17/2017   BILITOT 0.7 12/17/2017  Lipitor 10 mg qd. Due for pneumonia vaccine. Foot exam 04/29/17. Optho 07/11/17. - Denies new myalgias but did have a gout flare in R elbow ~2 wks ago after bowling.  Gout Pt noticed increased sensitivity, swelling and warmth to R elbow after bowling ~2 wks ago. States symptoms improved with indomethacin, however, he still has some sensitivity as he has run out of meds. He has also tried ibuprofen without improvement. Reports gout flare every 3-4 yrs but denies change in  diet.  Patient Active Problem List   Diagnosis Date Noted  . Diabetic nephropathy ([place]) 05/01/2017  . Diabetes ([place]) 05/01/2017  . Peripheral neuropathy 05/01/2017   No past medical history on file. No past surgical history on file. No Known Allergies Prior to Admission medications   Medication Sig Start Date End Date Taking? Authorizing Provider  atorvastatin (LIPITOR) 10 MG tablet Take 1 tablet (10 mg total) by mouth daily. 01/30/18   Wendie Agreste, MD  blood glucose meter kit and supplies Dispense based on patient and insurance preference. Check twice per day. FOR ICD-9 250.00, 250.01). 04/29/17   Wendie Agreste, MD  gabapentin (NEURONTIN) 300 MG capsule 367m QAM,  6036mQHS. 10/31/17   GrWendie AgresteMD  glipiZIDE (GLUCOTROL) 5 MG tablet Take 1 tablet (5 mg total) by mouth 2 (two) times daily before a meal. 11/03/17   GrWendie AgresteMD  metFORMIN (GLUCOPHAGE) 850 MG tablet Take 1 tablet (850 mg total) by mouth 2 (two) times daily with a meal. 02/10/18   GrWendie AgresteMD   Social History   Socioeconomic History  . Marital status: Married    Spouse name: Not on file  . Number of children: Not on file  . Years of education: Not on file  . Highest education level: Not on file  Occupational History  . Not on file  Social Needs  . Financial resource strain: Not on file  .  Food insecurity:    Worry: Not on file    Inability: Not on file  . Transportation needs:    Medical: Not on file    Non-medical: Not on file  Tobacco Use  . Smoking status: Never Smoker  . Smokeless tobacco: Never Used  Substance and Sexual Activity  . Alcohol use: Yes    Comment: occ  . Drug use: No  . Sexual activity: Yes  Lifestyle  . Physical activity:    Days per week: Not on file    Minutes per session: Not on file  . Stress: Not on file  Relationships  . Social connections:    Talks on phone: Not on file    Gets together: Not on file    Attends religious service: Not on  file    Active member of club or organization: Not on file    Attends meetings of clubs or organizations: Not on file    Relationship status: Not on file  . Intimate partner violence:    Fear of current or ex partner: Not on file    Emotionally abused: Not on file    Physically abused: Not on file    Forced sexual activity: Not on file  Other Topics Concern  . Not on file  Social History Narrative  . Not on file   Review of Systems  Constitutional: Negative for fatigue and unexpected weight change.  Eyes: Negative for visual disturbance.  Respiratory: Negative for cough, chest tightness and shortness of breath.   Cardiovascular: Negative for chest pain, palpitations and leg swelling.  Gastrointestinal: Negative for abdominal pain and blood in stool.  Musculoskeletal: Positive for arthralgias (improved) and joint swelling (improved). Negative for myalgias.  Neurological: Negative for dizziness, light-headedness, numbness and headaches.      Objective:   Physical Exam  Constitutional: He is oriented to person, place, and time. He appears well-developed and well-nourished.  HENT:  Head: Normocephalic and atraumatic.  Eyes: Pupils are equal, round, and reactive to light. EOM are normal.  Neck: No JVD present. Carotid bruit is not present.  Cardiovascular: Normal rate, regular rhythm and normal heart sounds.  No murmur heard. Pulmonary/Chest: Effort normal and breath sounds normal. He has no rales.  Musculoskeletal: He exhibits no edema.  R elbow: FROM. Skin is intact. No erythema. No sig warmth or swelling.  Neurological: He is alert and oriented to person, place, and time.  Skin: Skin is warm and dry.  Psychiatric: He has a normal mood and affect.  Vitals reviewed.   Vitals:   05/13/18 0807  BP: 122/84  Pulse: (!) 120  Resp: 17  Temp: 98 F (36.7 C)  TempSrc: Oral  SpO2: 98%  Weight: 270 lb (122.5 kg)  Height: '6\' 3"'  (1.905 m)  regular rate on exam     Assessment &  Plan:   David Crane is a 52 y.o. male Type 2 diabetes mellitus with microalbuminuria, without long-term current use of insulin ([place]) - Plan: Microalbumin/Creatinine Ratio, Urine, atorvastatin (LIPITOR) 10 MG tablet, glipiZIDE (GLUCOTROL) 5 MG tablet, metFORMIN (GLUCOPHAGE) 850 MG tablet  -Tolerating current regimen, continue same.  Check A1c, urine microalbumin.  -Continue to watch diet, exercise.  Commended on few pound weight loss since last visit  Diabetic polyneuropathy associated with type 2 diabetes mellitus (HCC) Peripheral polyneuropathy - Plan: gabapentin (NEURONTIN) 300 MG capsule  -Continue gabapentin, as controlled/stable  Hyperlipidemia, unspecified hyperlipidemia type - Plan: Comprehensive metabolic panel, Lipid panel, atorvastatin (LIPITOR) 10 MG tablet  -  Tolerating Lipitor, check labs, continue same dose  Screen for colon cancer - Plan: Ambulatory referral to Gastroenterology  -Overdue for colonoscopy, referral placed.  Right elbow pain  -Possible gout now improving.  Short-term Advil or Aleve if needed, but expect symptoms to continue to improve.  RTC precautions if not.  RTC precautions if recurrent gout.   Meds ordered this encounter  Medications  . atorvastatin (LIPITOR) 10 MG tablet    Sig: Take 1 tablet (10 mg total) by mouth daily.    Dispense:  90 tablet    Refill:  1  . gabapentin (NEURONTIN) 300 MG capsule    Sig: 33m QAM,  603mQHS.    Dispense:  270 capsule    Refill:  1  . glipiZIDE (GLUCOTROL) 5 MG tablet    Sig: Take 1 tablet (5 mg total) by mouth 2 (two) times daily before a meal.    Dispense:  180 tablet    Refill:  1  . metFORMIN (GLUCOPHAGE) 850 MG tablet    Sig: Take 1 tablet (850 mg total) by mouth 2 (two) times daily with a meal.    Dispense:  180 tablet    Refill:  1   Patient Instructions    I will check diabetes test, but keep up the good work with diet and exercise. No change in meds for now.   If recurrent symptoms of  gout, I would consider other meds or testing. If current symptoms are not continuing to improve - return for xrays or other testing. Over the counter advil or alleve temporarily for now if needed only.   Return to the clinic or go to the nearest emergency room if any of your symptoms worsen or new symptoms occur.  I recommend flu vaccine and pneumonia vaccine - please return to have those given if you change your mind.    If you have lab work done today you will be contacted with your lab results within the next 2 weeks.  If you have not heard from [place] please contact us[place]oreaThe fastest way to get your results is to register for My Chart.   IF you received an x-ray today, you [facility]. Please contact [facility] at [phone] questions or concerns regarding your invoice.   IF you received labwork today, you will receive an invoice from [place] [facility] at [phone] questions or concerns regarding your invoice.   Our billing staff will not be able to assist you with questions regarding bills from these companies.  You will be contacted with the lab results as soon as they are available. The fastest way to get your results is to activate your My Chart account. Instructions are located on the last page of this paperwork. If you have not heard from us[place]orea[place]arding the results in 2 weeks, please contact this office.

## 2018-05-13 NOTE — Patient Instructions (Addendum)
  I will check diabetes test, but keep up the good work with diet and exercise. No change in meds for now.   If recurrent symptoms of gout, I would consider other meds or testing. If current symptoms are not continuing to improve - return for xrays or other testing. Over the counter advil or alleve temporarily for now if needed only.   Return to the clinic or go to the nearest emergency room if any of your symptoms worsen or new symptoms occur.  I recommend flu vaccine and pneumonia vaccine - please return to have those given if you change your mind.    If you have lab work done today you will be contacted with your lab results within the next 2 weeks.  If you have not heard from [place] then please contact us. The fastest way to get your results is to register for My Chart.   IF you received an x-ray today, you will receive an invoice from [facility]. Please contact [facility] at [phone] with questions or concerns regarding your invoice.   IF you received labwork today, you will receive an invoice from [place]. Please contact [facility] at [phone] with questions or concerns regarding your invoice.   Our billing staff will not be able to assist you with questions regarding bills from these companies.  You will be contacted with the lab results as soon as they are available. The fastest way to get your results is to activate your My Chart account. Instructions are located on the last page of this paperwork. If you have not heard from [place] regarding the results in 2 weeks, please contact this office.

## 2018-05-13 NOTE — Addendum Note (Signed)
Addended by: [name] R on: 05/13/2018 08:39 AM   Modules accepted: Orders

## 2018-05-14 LAB — MICROALBUMIN / CREATININE URINE RATIO
Creatinine, Urine: 255.1 mg/dL
Microalb/Creat Ratio: 87.9 mg/g creat — ABNORMAL HIGH (ref 0.0–30.0)
Microalbumin, Urine: 224.2 ug/mL

## 2018-05-15 ENCOUNTER — Telehealth: Payer: Self-pay | Admitting: Family Medicine

## 2018-05-15 ENCOUNTER — Other Ambulatory Visit: Payer: Self-pay | Admitting: Family Medicine

## 2018-05-15 DIAGNOSIS — G629 Polyneuropathy, unspecified: Secondary | ICD-10-CM

## 2018-05-15 NOTE — Telephone Encounter (Signed)
Copied from [place] [phone]. Topic: Quick Communication - Rx Refill/Question >> May 15, 2018  2:41 PM Jarold Motto, Fraser Din wrote: Medication:gabapentin (NEURONTIN) 300 MG capsule [[id number]] pt stated that pharmacy never received   Has the patient contacted their pharmacy? Yes [place], [place]. [phone] (Phone) [phone] (Fax)   Agent: Please be advised that RX refills may take up to 3 business days. We ask that you follow-up with your pharmacy.

## 2018-05-19 ENCOUNTER — Other Ambulatory Visit: Payer: Self-pay

## 2018-05-19 DIAGNOSIS — G629 Polyneuropathy, unspecified: Secondary | ICD-10-CM

## 2018-05-19 MED ORDER — GABAPENTIN 300 MG PO CAPS: ORAL_CAPSULE | ORAL | 1 refills | Status: DC

## 2018-05-19 NOTE — Telephone Encounter (Signed)
REviewed chart.  Medication sent in on 8/27 but was sent as print.   Resent gabapentin rx electronically.

## 2018-07-04 ENCOUNTER — Ambulatory Visit (AMBULATORY_SURGERY_CENTER): Payer: Self-pay

## 2018-07-04 VITALS — Ht 75.0 in | Wt 272.4 lb

## 2018-07-04 DIAGNOSIS — Z1211 Encounter for screening for malignant neoplasm of colon: Secondary | ICD-10-CM

## 2018-07-04 MED ORDER — NA SULFATE-K SULFATE-MG SULF 17.5-3.13-1.6 GM/177ML PO SOLN: 1.0000 | Freq: Once | ORAL | 0 refills | Status: AC

## 2018-07-04 NOTE — Progress Notes (Signed)
Denies allergies to eggs or soy products. Denies complication of anesthesia or sedation. Denies use of weight loss medication. Denies use of O2.   Emmi instructions declined.  

## 2018-07-07 ENCOUNTER — Encounter: Payer: Self-pay | Admitting: Internal Medicine

## 2018-07-18 ENCOUNTER — Ambulatory Visit (AMBULATORY_SURGERY_CENTER): Payer: BLUE CROSS/BLUE SHIELD | Admitting: Internal Medicine

## 2018-07-18 ENCOUNTER — Encounter: Payer: Self-pay | Admitting: Internal Medicine

## 2018-07-18 VITALS — BP 100/72 | HR 79 | Temp 98.0°F | Resp 14

## 2018-07-18 DIAGNOSIS — K621 Rectal polyp: Secondary | ICD-10-CM | POA: Diagnosis not present

## 2018-07-18 DIAGNOSIS — D128 Benign neoplasm of rectum: Secondary | ICD-10-CM

## 2018-07-18 DIAGNOSIS — Z1211 Encounter for screening for malignant neoplasm of colon: Secondary | ICD-10-CM

## 2018-07-18 MED ORDER — SODIUM CHLORIDE 0.9 % IV SOLN: 500.0000 mL | Freq: Once | INTRAVENOUS | Status: DC

## 2018-07-18 NOTE — Progress Notes (Signed)
Pt's states no medical or surgical changes since previsit or office visit. 

## 2018-07-18 NOTE — Op Note (Signed)
[place] Patient Name: David Crane Procedure Date: 07/18/2018 9:55 AM MRN: [id number] Endoscopist: Docia Chuck. Henrene Pastor , MD Age: 52 Referring MD:  Date of Birth: May 26, 1966 Gender: Male Account #: [id number] Procedure:                Colonoscopy with cold snare polypectomy x 1 Indications:              Screening for colorectal malignant neoplasm Medicines:                Monitored Anesthesia Care Procedure:                Pre-Anesthesia Assessment:                           - Prior to the procedure, a History and Physical                            was performed, and patient medications and                            allergies were reviewed. The patient's tolerance of                            previous anesthesia was also reviewed. The risks                            and benefits of the procedure and the sedation                            options and risks were discussed with the patient.                            All questions were answered, and informed consent                            was obtained. Prior Anticoagulants: The patient has                            taken no previous anticoagulant or antiplatelet                            agents. ASA Grade Assessment: II - A patient with                            mild systemic disease. After reviewing the risks                            and benefits, the patient was deemed in                            satisfactory condition to undergo the procedure.                           After obtaining informed consent, the colonoscope  was passed under direct vision. Throughout the                            procedure, the patient's blood pressure, pulse, and                            oxygen saturations were monitored continuously. The                            Colonoscope was introduced through the anus and                            advanced to the the cecum, identified by     appendiceal orifice and ileocecal valve. The                            ileocecal valve, appendiceal orifice, and rectum                            were photographed. The quality of the bowel                            preparation was excellent. The colonoscopy was                            performed without difficulty. The patient tolerated                            the procedure well. The bowel preparation used was                            SUPREP. Scope In: 10:09:40 AM Scope Out: 10:26:47 AM Scope Withdrawal Time: 0 hours 13 minutes 39 seconds  Total Procedure Duration: 0 hours 17 minutes 7 seconds  Findings:                 A 5 mm polyp was found in the rectum. The polyp was                            removed with a cold snare. Resection and retrieval                            were complete.                           The exam was otherwise without abnormality on                            direct and retroflexion views. Complications:            No immediate complications. Estimated blood loss:                            None. Estimated Blood Loss:     Estimated blood loss: none. Impression:               -  One 5 mm polyp in the rectum, removed with a cold                            snare. Resected and retrieved.                           - The examination was otherwise normal on direct                            and retroflexion views. Recommendation:           - Repeat colonoscopy in 5-10 years for surveillance.                           - Patient has a contact number available for                            emergencies. The signs and symptoms of potential                            delayed complications were discussed with the                            patient. Return to normal activities tomorrow.                            Written discharge instructions were provided to the                            patient.                           - Resume previous diet.                            - Continue present medications.                           - Await pathology results. Docia Chuck. Henrene Pastor, MD 07/18/2018 10:31:43 AM This report has been signed electronically.

## 2018-07-18 NOTE — Progress Notes (Signed)
Called to room to assist during endoscopic procedure.  Patient ID and intended procedure confirmed with present staff. Received instructions for my participation in the procedure from the performing physician.  

## 2018-07-18 NOTE — Progress Notes (Signed)
PT taken to PACU. Monitors in place. VSS. Report given to RN. 

## 2018-07-18 NOTE — Patient Instructions (Signed)
Continue present medications. Please read handout on polyps.    YOU HAD AN ENDOSCOPIC PROCEDURE TODAY AT [facility]:   Refer to the procedure report that was given to you for any specific questions about what was found during the examination.  If the procedure report does not answer your questions, please call your gastroenterologist to clarify.  If you requested that your care partner not be given the details of your procedure findings, then the procedure report has been included in a sealed envelope for you to review at your convenience later.  YOU SHOULD EXPECT: Some feelings of bloating in the abdomen. Passage of more gas than usual.  Walking can help get rid of the air that was put into your GI tract during the procedure and reduce the bloating. If you had a lower endoscopy (such as a colonoscopy or flexible sigmoidoscopy) you may notice spotting of blood in your stool or on the toilet paper. If you underwent a bowel prep for your procedure, you may not have a normal bowel movement for a few days.  Please Note:  You might notice some irritation and congestion in your nose or some drainage.  This is from the oxygen used during your procedure.  There is no need for concern and it should clear up in a day or so.  SYMPTOMS TO REPORT IMMEDIATELY:   Following lower endoscopy (colonoscopy or flexible sigmoidoscopy):  Excessive amounts of blood in the stool  Significant tenderness or worsening of abdominal pains  Swelling of the abdomen that is new, acute  Fever of 100F or higher    For urgent or emergent issues, a gastroenterologist can be reached at any hour by calling [phone].   DIET:  We do recommend a small meal at first, but then you may proceed to your regular diet.  Drink plenty of fluids but you should avoid alcoholic beverages for 24 hours.  ACTIVITY:  You should plan to take it easy for the rest of today and you should NOT DRIVE or use heavy machinery  until tomorrow (because of the sedation medicines used during the test).    FOLLOW UP: Our staff will call the number listed on your records the next business day following your procedure to check on you and address any questions or concerns that you may have regarding the information given to you following your procedure. If we do not reach you, we will leave a message.  However, if you are feeling well and you are not experiencing any problems, there is no need to return our call.  We will assume that you have returned to your regular daily activities without incident.  If any biopsies were taken you will be contacted by phone or by letter within the next 1-3 weeks.  Please call us at [phone] if you have not heard about the biopsies in 3 weeks.    SIGNATURES/CONFIDENTIALITY: You and/or your care partner have signed paperwork which will be entered into your electronic medical record.  These signatures attest to the fact that that the information above on your After Visit Summary has been reviewed and is understood.  Full responsibility of the confidentiality of this discharge information lies with you and/or your care-partner.

## 2018-07-21 ENCOUNTER — Telehealth: Payer: Self-pay

## 2018-07-21 NOTE — Telephone Encounter (Signed)
   Follow up Call-  Call back number 07/18/2018  Post procedure Call Back phone  # [phone]  Permission to leave phone message Yes     Patient questions:  Do you have a fever, pain , or abdominal swelling? No. Pain Score  0 *  Have you tolerated food without any problems? Yes.    Have you been able to return to your normal activities? Yes.    Do you have any questions about your discharge instructions: Diet   No. Medications  No. Follow up visit  No.   Do you have questions or concerns about your Care? No.  Actions: * If pain score is 4 or above: No action needed, pain <4.

## 2018-07-21 NOTE — Telephone Encounter (Signed)
No answer, left message to call back later today, B.Sho Salguero RN. 

## 2018-07-23 ENCOUNTER — Encounter: Payer: Self-pay | Admitting: Internal Medicine

## 2018-08-26 ENCOUNTER — Other Ambulatory Visit: Payer: Self-pay

## 2018-08-26 ENCOUNTER — Ambulatory Visit: Payer: BLUE CROSS/BLUE SHIELD | Admitting: Family Medicine

## 2018-08-26 ENCOUNTER — Encounter: Payer: Self-pay | Admitting: Family Medicine

## 2018-08-26 VITALS — BP 115/79 | HR 98 | Temp 98.5°F | Ht 75.0 in | Wt 272.0 lb

## 2018-08-26 DIAGNOSIS — R809 Proteinuria, unspecified: Secondary | ICD-10-CM

## 2018-08-26 DIAGNOSIS — E1129 Type 2 diabetes mellitus with other diabetic kidney complication: Secondary | ICD-10-CM

## 2018-08-26 NOTE — Progress Notes (Signed)
Subjective:    Patient ID: David Crane, male    DOB: 04/21/1966, 52 y.o.   MRN: [id number]  HPI David Crane is a 51 y.o. male Presents today for: Chief Complaint  Patient presents with  . Diabetes    f/u     Diabetes: On glipizide 2m BID with meals - lunch and dinner - dinner largest meal.  Metformin 8559mBID with meals - rare diarrhea.  No new side effects with meds.  Fasting BS 165 (down form 180's). Daytime (not 2hr pp - random - 82, 92, 97, 98)  Associated with peripheral neuropathy. Taking gabapentin 30065mn the morning, 600m10m night.  Has some pain in feet daily, has not tried extra dose frequently - rare use at 4pm.  Has appt with optho next month. Other DM HM up to date.   Lab Results  Component Value Date   HGBA1C 7.3 (H) 05/13/2018   HGBA1C 7.7 (H) 01/30/2018   HGBA1C 7.6 (H) 10/31/2017   Lab Results  Component Value Date   LDLCALC 53 05/13/2018   CREATININE 1.15 05/13/2018    Hyperlipidemia:  Lab Results  Component Value Date   CHOL 120 05/13/2018   HDL 43 05/13/2018   LDLCALC 53 05/13/2018   TRIG 118 05/13/2018   CHOLHDL 2.8 05/13/2018   Lab Results  Component Value Date   ALT 23 05/13/2018   AST 20 05/13/2018   ALKPHOS 50 05/13/2018   BILITOT 0.9 05/13/2018  Lipitor 10mg68mly.    Patient Active Problem List   Diagnosis Date Noted  . Diabetic nephropathy (HCC) [place]15/2018  . Diabetes (HCC) [place]15/2018  . Peripheral neuropathy 05/01/2017   Past Medical History:  Diagnosis Date  . Diabetes mellitus without complication (HCC) [place] GERD (gastroesophageal reflux disease)    Past Surgical History:  Procedure Laterality Date  . Growth removed Left   . TONSILLECTOMY    . WISDOM TOOTH EXTRACTION     No Known Allergies Prior to Admission medications   Medication Sig Start Date End Date Taking? Authorizing Provider  atorvastatin (LIPITOR) 10 MG tablet Take 1 tablet (10 mg total) by mouth daily. 05/13/18  Yes GreenWendie Agreste  blood glucose meter kit and supplies Dispense based on patient and insurance preference. Check twice per day. FOR ICD-9 250.00, 250.01). 04/29/17  Yes GreenWendie Agreste gabapentin (NEURONTIN) 300 MG capsule 300mg 40m  600mg Q71m9/2/19  Yes Makyia Erxleben,Wendie AgrestelipiZIDE (GLUCOTROL) 5 MG tablet Take 1 tablet (5 mg total) by mouth 2 (two) times daily before a meal. 05/13/18  Yes Trevia Nop,Wendie AgresteetFORMIN (GLUCOPHAGE) 850 MG tablet Take 1 tablet (850 mg total) by mouth 2 (two) times daily with a meal. 05/13/18  Yes [place] Antolin,Wendie AgresteSocial History   Socioeconomic History  . Marital status: Married    Spouse name: Not on file  . Number of children: Not on file  . Years of education: Not on file  . Highest education level: Not on file  Occupational History  . Not on file  Social Needs  . Financial resource strain: Not on file  . Food insecurity:    Worry: Not on file    Inability: Not on file  . Transportation needs:    Medical: Not on file    Non-medical: Not on file  Tobacco Use  . Smoking status: Never Smoker  . Smokeless tobacco: Never Used  Substance and  Sexual Activity  . Alcohol use: Yes    Comment: occ  . Drug use: No  . Sexual activity: Yes  Lifestyle  . Physical activity:    Days per week: Not on file    Minutes per session: Not on file  . Stress: Not on file  Relationships  . Social connections:    Talks on phone: Not on file    Gets together: Not on file    Attends religious service: Not on file    Active member of club or organization: Not on file    Attends meetings of clubs or organizations: Not on file    Relationship status: Not on file  . Intimate partner violence:    Fear of current or ex partner: Not on file    Emotionally abused: Not on file    Physically abused: Not on file    Forced sexual activity: Not on file  Other Topics Concern  . Not on file  Social History Narrative  . Not on file    Review of Systems    Constitutional: Negative for fatigue and unexpected weight change.  Eyes: Negative for visual disturbance.  Respiratory: Negative for cough, chest tightness and shortness of breath.   Cardiovascular: Negative for chest pain, palpitations and leg swelling.  Gastrointestinal: Negative for abdominal pain and blood in stool.  Neurological: Negative for dizziness, light-headedness and headaches.       Objective:   Physical Exam Vitals signs reviewed.  Constitutional:      Appearance: He is well-developed.  HENT:     Head: Normocephalic and atraumatic.  Eyes:     Pupils: Pupils are equal, round, and reactive to light.  Neck:     Vascular: No carotid bruit or JVD.  Cardiovascular:     Rate and Rhythm: Normal rate and regular rhythm.     Heart sounds: Normal heart sounds. No murmur.  Pulmonary:     Effort: Pulmonary effort is normal.     Breath sounds: Normal breath sounds. No rales.  Skin:    General: Skin is warm and dry.  Neurological:     Mental Status: He is alert and oriented to person, place, and time.    Vitals:   08/26/18 1703  BP: 115/79  Pulse: 98  Temp: 98.5 F (36.9 C)  TempSrc: Oral  SpO2: 99%  Weight: 272 lb (123.4 kg)  Height: _0  (1.905 m)       Assessment & Plan:  David Crane is a 52 y.o. male Type 2 diabetes mellitus with microalbuminuria, without long-term current use of insulin ([place]) - Plan: Hemoglobin A1c  -Check A1c, potentially could increase metformin but may be intolerant with gastrointestinal side effects to higher doses  -Consider other classes including SGLT2 depending on readings.  Alternatively could adjust glipizide if needed at dinner but risk of hypoglycemia was discussed  -Continue gabapentin for peripheral neuropathic symptoms with options of additional daytime dosing.  No orders of the defined types were placed in this encounter.  Patient Instructions     For peripheral neuropathy symptoms okay to take an additional dose  of gabapentin throughout the day if needed.  Let me know if I need to change that prescription.   Depending on A1c level, could increase to the metformin but would need to watch for diarrhea OR increase glipizide dose at dinner (could consider 104m), but there are other med classes that may help as well.  We can wait on A1c result first. No  changes for now.   If you have lab work done today you will be contacted with your lab results within the next 2 weeks.  If you have not heard from [place] then please contact us. The fastest way to get your results is to register for My Chart.   IF you received an x-ray today, you will receive an invoice from [facility]. Please contact [facility] at [phone] with questions or concerns regarding your invoice.   IF you received labwork today, you will receive an invoice from [place]. Please contact [facility] at [phone] with questions or concerns regarding your invoice.   Our billing staff will not be able to assist you with questions regarding bills from these companies.  You will be contacted with the lab results as soon as they are available. The fastest way to get your results is to activate your My Chart account. Instructions are located on the last page of this paperwork. If you have not heard from [place] regarding the results in 2 weeks, please contact this office.    ]   Signed,   [name], MD [facility] [place].  08/30/18 3:43 PM

## 2018-08-26 NOTE — Patient Instructions (Addendum)
   For peripheral neuropathy symptoms okay to take an additional dose of gabapentin throughout the day if needed.  Let me know if I need to change that prescription.   Depending on A1c level, could increase to the metformin but would need to watch for diarrhea OR increase glipizide dose at dinner (could consider 10mg ), but there are other med classes that may help as well.  We can wait on A1c result first. No changes for now.   If you have lab work done today you will be contacted with your lab results within the next 2 weeks.  If you have not heard from [place] then please contact us. The fastest way to get your results is to register for My Chart.   IF you received an x-ray today, you will receive an invoice from [facility]. Please contact [facility] at [phone] with questions or concerns regarding your invoice.   IF you received labwork today, you will receive an invoice from [place]. Please contact [facility] at [phone] with questions or concerns regarding your invoice.   Our billing staff will not be able to assist you with questions regarding bills from these companies.  You will be contacted with the lab results as soon as they are available. The fastest way to get your results is to activate your My Chart account. Instructions are located on the last page of this paperwork. If you have not heard from [place] regarding the results in 2 weeks, please contact this office.    ]

## 2018-08-27 LAB — HEMOGLOBIN A1C
Est. average glucose Bld gHb Est-mCnc: 148 mg/dL
Hgb A1c MFr Bld: 6.8 % — ABNORMAL HIGH (ref 4.8–5.6)

## 2018-08-30 ENCOUNTER — Encounter: Payer: Self-pay | Admitting: Family Medicine

## 2018-10-07 DIAGNOSIS — E083313 Diabetes mellitus due to underlying condition with moderate nonproliferative diabetic retinopathy with macular edema, bilateral: Secondary | ICD-10-CM | POA: Diagnosis not present

## 2018-11-10 DIAGNOSIS — H35711 Central serous chorioretinopathy, right eye: Secondary | ICD-10-CM | POA: Diagnosis not present

## 2018-11-12 ENCOUNTER — Other Ambulatory Visit: Payer: Self-pay | Admitting: Family Medicine

## 2018-11-12 DIAGNOSIS — G629 Polyneuropathy, unspecified: Secondary | ICD-10-CM

## 2018-11-23 ENCOUNTER — Other Ambulatory Visit: Payer: Self-pay | Admitting: Family Medicine

## 2018-11-23 DIAGNOSIS — E1129 Type 2 diabetes mellitus with other diabetic kidney complication: Secondary | ICD-10-CM

## 2018-11-23 DIAGNOSIS — R809 Proteinuria, unspecified: Principal | ICD-10-CM

## 2018-11-25 ENCOUNTER — Ambulatory Visit: Payer: BLUE CROSS/BLUE SHIELD | Admitting: Family Medicine

## 2018-11-25 ENCOUNTER — Other Ambulatory Visit: Payer: Self-pay

## 2018-11-25 ENCOUNTER — Encounter: Payer: Self-pay | Admitting: Family Medicine

## 2018-11-25 VITALS — BP 125/81 | HR 101 | Temp 98.4°F | Resp 16 | Ht 75.0 in | Wt 271.6 lb

## 2018-11-25 DIAGNOSIS — G629 Polyneuropathy, unspecified: Secondary | ICD-10-CM

## 2018-11-25 DIAGNOSIS — E1129 Type 2 diabetes mellitus with other diabetic kidney complication: Secondary | ICD-10-CM

## 2018-11-25 DIAGNOSIS — E785 Hyperlipidemia, unspecified: Secondary | ICD-10-CM | POA: Diagnosis not present

## 2018-11-25 DIAGNOSIS — R809 Proteinuria, unspecified: Secondary | ICD-10-CM

## 2018-11-25 MED ORDER — GABAPENTIN 300 MG PO CAPS: ORAL_CAPSULE | ORAL | 1 refills | Status: DC

## 2018-11-25 MED ORDER — LISINOPRIL 5 MG PO TABS: 5.0000 mg | ORAL_TABLET | Freq: Every day | ORAL | 1 refills | Status: DC

## 2018-11-25 MED ORDER — ATORVASTATIN CALCIUM 10 MG PO TABS: 10.0000 mg | ORAL_TABLET | Freq: Every day | ORAL | 1 refills | Status: DC

## 2018-11-25 MED ORDER — GLIPIZIDE 5 MG PO TABS: ORAL_TABLET | ORAL | 1 refills | Status: DC

## 2018-11-25 MED ORDER — METFORMIN HCL 850 MG PO TABS: 850.0000 mg | ORAL_TABLET | Freq: Two times a day (BID) | ORAL | 1 refills | Status: DC

## 2018-11-25 NOTE — Progress Notes (Signed)
Subjective:    Patient ID: David Crane, male    DOB: [birth year], 53 y.o.   MRN: [id number]  HPI David Crane is a 53 y.o. male Presents today for: Chief Complaint  Patient presents with  . Diabetes    3 mon f/u for diabetes last A1c was 6.8    Diabetes: Complicated by peripheral neuropathy and microalbuminuria.  Currently on metformin  850 mg twice daily, glipizide 5 mg twice daily.  Gabapentin 300 mg every morning, 600 mg nightly for neuropathic symptoms.  He is on Lipitor for statin. - no new side effects Home readings - 85, 160-180. No symptomatic lows known - may have been a little low.   Microalbumin: Elevated ratio 87.9 in August 2019 Optho, foot exam, pneumovax: Pneumovax declined.  He is up-to-date on foot exam and ophthalmology exam. Saw optho - [place] in January - plan on retinal scan for waviness in eye. Had scan and concern for possible retinopathy. Plan for retinal specialist eval. - Dr. Sherlynn Stalls in few weeks.  Walking with work, no regular exercise outside of work. h  Gabapentin helping with foot pain, rare extra one.   Lab Results  Component Value Date   HGBA1C 6.8 (H) 08/26/2018   HGBA1C 7.3 (H) 05/13/2018   HGBA1C 7.7 (H) 01/30/2018   Lab Results  Component Value Date   LDLCALC 53 05/13/2018   CREATININE 1.15 05/13/2018    BP Readings from Last 3 Encounters:  11/25/18 125/81  08/26/18 115/79  07/18/18 100/72   Wt Readings from Last 3 Encounters:  11/25/18 271 lb 9.6 oz (123.2 kg)  08/26/18 272 lb (123.4 kg)  07/04/18 272 lb 6.4 oz (123.6 kg)     Patient Active Problem List   Diagnosis Date Noted  . Diabetic nephropathy ([place]) 05/01/2017  . Diabetes ([place]) 05/01/2017  . Peripheral neuropathy 05/01/2017   Past Medical History:  Diagnosis Date  . Diabetes mellitus without complication ([place])   . GERD (gastroesophageal reflux disease)    Past Surgical History:  Procedure Laterality Date  . Growth removed Left   . TONSILLECTOMY      . WISDOM TOOTH EXTRACTION     No Known Allergies Prior to Admission medications   Medication Sig Start Date End Date Taking? Authorizing Provider  atorvastatin (LIPITOR) 10 MG tablet Take 1 tablet (10 mg total) by mouth daily. 05/13/18  Yes Wendie Agreste, MD  blood glucose meter kit and supplies Dispense based on patient and insurance preference. Check twice per day. FOR ICD-9 250.00, 250.01). 04/29/17  Yes Wendie Agreste, MD  gabapentin (NEURONTIN) 300 MG capsule TAKE 1 CAPSULE BY MOUTH IN THE MORNING AND 2 CAPSULES AT BEDTIME 11/12/18  Yes Wendie Agreste, MD  glipiZIDE (GLUCOTROL) 5 MG tablet TAKE 1 TABLET BY MOUTH TWICE DAILY BEFORE A MEAL 11/24/18  Yes Wendie Agreste, MD  metFORMIN (GLUCOPHAGE) 850 MG tablet Take 1 tablet (850 mg total) by mouth 2 (two) times daily with a meal. 05/13/18  Yes Wendie Agreste, MD   Social History   Socioeconomic History  . Marital status: Married    Spouse name: Not on file  . Number of children: Not on file  . Years of education: Not on file  . Highest education level: Not on file  Occupational History  . Not on file  Social Needs  . Financial resource strain: Not on file  . Food insecurity:    Worry: Not on file    Inability: Not  on file  . Transportation needs:    Medical: Not on file    Non-medical: Not on file  Tobacco Use  . Smoking status: Never Smoker  . Smokeless tobacco: Never Used  Substance and Sexual Activity  . Alcohol use: Yes    Comment: occ  . Drug use: No  . Sexual activity: Yes  Lifestyle  . Physical activity:    Days per week: Not on file    Minutes per session: Not on file  . Stress: Not on file  Relationships  . Social connections:    Talks on phone: Not on file    Gets together: Not on file    Attends religious service: Not on file    Active member of club or organization: Not on file    Attends meetings of clubs or organizations: Not on file    Relationship status: Not on file  . Intimate partner  violence:    Fear of current or ex partner: Not on file    Emotionally abused: Not on file    Physically abused: Not on file    Forced sexual activity: Not on file  Other Topics Concern  . Not on file  Social History Narrative  . Not on file     Review of Systems  Constitutional: Negative for fatigue and unexpected weight change.  Eyes: Negative for visual disturbance.  Respiratory: Negative for cough, chest tightness and shortness of breath.   Cardiovascular: Negative for chest pain, palpitations and leg swelling.  Gastrointestinal: Negative for abdominal pain and blood in stool.  Neurological: Negative for dizziness, light-headedness and headaches.       Objective:   Physical Exam Vitals signs reviewed.  Constitutional:      Appearance: He is well-developed.  HENT:     Head: Normocephalic and atraumatic.  Eyes:     Pupils: Pupils are equal, round, and reactive to light.  Neck:     Vascular: No carotid bruit or JVD.  Cardiovascular:     Rate and Rhythm: Normal rate and regular rhythm.     Heart sounds: Normal heart sounds. No murmur.  Pulmonary:     Effort: Pulmonary effort is normal.     Breath sounds: Normal breath sounds. No rales.  Skin:    General: Skin is warm and dry.  Neurological:     Mental Status: He is alert and oriented to person, place, and time.    Vitals:   11/25/18 1613  BP: 125/81  Pulse: (!) 101  Resp: 16  Temp: 98.4 F (36.9 C)  TempSrc: Oral  SpO2: 97%  Weight: 271 lb 9.6 oz (123.2 kg)  Height: _0  (1.905 m)       Assessment & Plan:   David Crane is a 53 y.o. male Type 2 diabetes mellitus with microalbuminuria, without long-term current use of insulin (HCC) - Plan: Hemoglobin A1c, Comprehensive metabolic panel, lisinopril (PRINIVIL,ZESTRIL) 5 MG tablet, atorvastatin (LIPITOR) 10 MG tablet, glipiZIDE (GLUCOTROL) 5 MG tablet, metFORMIN (GLUCOPHAGE) 850 MG tablet  -Associated microalbuminuria, and possible retinopathy based on  recent history.  -Add lisinopril for nephro protection.  Continue metformin and glipizide same dose for now.  -Plans fasting lab visit within the next week.  Hyperlipidemia, unspecified hyperlipidemia type - Plan: Lipid panel, Comprehensive metabolic panel, atorvastatin (LIPITOR) 10 MG tablet  - Stable, tolerating current regimen. Medications refilled. Labs pending as above.   Peripheral polyneuropathy - Plan: gabapentin (NEURONTIN) 300 MG capsule  -Stable, continue gabapentin same dose for  now.  Meds ordered this encounter  Medications  . lisinopril (PRINIVIL,ZESTRIL) 5 MG tablet    Sig: Take 1 tablet (5 mg total) by mouth daily.    Dispense:  90 tablet    Refill:  1  . atorvastatin (LIPITOR) 10 MG tablet    Sig: Take 1 tablet (10 mg total) by mouth daily.    Dispense:  90 tablet    Refill:  1  . gabapentin (NEURONTIN) 300 MG capsule    Sig: TAKE 1 CAPSULE BY MOUTH IN THE MORNING AND 2 CAPSULES AT BEDTIME    Dispense:  270 capsule    Refill:  1  . glipiZIDE (GLUCOTROL) 5 MG tablet    Sig: TAKE 1 TABLET BY MOUTH TWICE DAILY BEFORE A MEAL    Dispense:  180 tablet    Refill:  1  . metFORMIN (GLUCOPHAGE) 850 MG tablet    Sig: Take 1 tablet (850 mg total) by mouth 2 (two) times daily with a meal.    Dispense:  180 tablet    Refill:  1   Patient Instructions   Keep follow up with retina specialist as planned.   Return for fasting lab visit only in next week.  No med changes for now, but watch for low blood sugars. If those occur  I do recommend trying low dose lisinopril for protein in the urine.   Recheck diabetes in 3 months.    If you have lab work done today you will be contacted with your lab results within the next 2 weeks.  If you have not heard from [place] then please contact us. The fastest way to get your results is to register for My Chart.   IF you received an x-ray today, you will receive an invoice from [facility]. Please contact [facility] at  [phone] with questions or concerns regarding your invoice.   IF you received labwork today, you will receive an invoice from [place]. Please contact [facility] at [phone] with questions or concerns regarding your invoice.   Our billing staff will not be able to assist you with questions regarding bills from these companies.  You will be contacted with the lab results as soon as they are available. The fastest way to get your results is to activate your My Chart account. Instructions are located on the last page of this paperwork. If you have not heard from [place] regarding the results in 2 weeks, please contact this office.       Signed,   [name], MD [facility] [place].  11/27/18 3:54 PM

## 2018-11-25 NOTE — Patient Instructions (Addendum)
Keep follow up with retina specialist as planned.   Return for fasting lab visit only in next week.  No med changes for now, but watch for low blood sugars. If those occur  I do recommend trying low dose lisinopril for protein in the urine.   Recheck diabetes in 3 months.    If you have lab work done today you will be contacted with your lab results within the next 2 weeks.  If you have not heard from [place] then please contact us. The fastest way to get your results is to register for My Chart.   IF you received an x-ray today, you will receive an invoice from [facility]. Please contact [facility] at [phone] with questions or concerns regarding your invoice.   IF you received labwork today, you will receive an invoice from [place]. Please contact [facility] at [phone] with questions or concerns regarding your invoice.   Our billing staff will not be able to assist you with questions regarding bills from these companies.  You will be contacted with the lab results as soon as they are available. The fastest way to get your results is to activate your My Chart account. Instructions are located on the last page of this paperwork. If you have not heard from [place] regarding the results in 2 weeks, please contact this office.

## 2018-11-27 ENCOUNTER — Encounter: Payer: Self-pay | Admitting: Family Medicine

## 2018-12-02 ENCOUNTER — Other Ambulatory Visit: Payer: Self-pay

## 2018-12-02 ENCOUNTER — Ambulatory Visit (INDEPENDENT_AMBULATORY_CARE_PROVIDER_SITE_OTHER): Payer: BLUE CROSS/BLUE SHIELD | Admitting: Family Medicine

## 2018-12-02 DIAGNOSIS — R809 Proteinuria, unspecified: Secondary | ICD-10-CM

## 2018-12-02 DIAGNOSIS — E1129 Type 2 diabetes mellitus with other diabetic kidney complication: Secondary | ICD-10-CM

## 2018-12-02 DIAGNOSIS — E785 Hyperlipidemia, unspecified: Secondary | ICD-10-CM | POA: Diagnosis not present

## 2018-12-02 NOTE — Progress Notes (Signed)
Nurse visit for labs only

## 2018-12-03 LAB — COMPREHENSIVE METABOLIC PANEL
ALT: 17 IU/L (ref 0–44)
AST: 19 IU/L (ref 0–40)
Albumin/Globulin Ratio: 2.4 — ABNORMAL HIGH (ref 1.2–2.2)
Albumin: 4.7 g/dL (ref 3.8–4.9)
Alkaline Phosphatase: 43 IU/L (ref 39–117)
BUN/Creatinine Ratio: 21 — ABNORMAL HIGH (ref 9–20)
BUN: 25 mg/dL — AB (ref 6–24)
Bilirubin Total: 0.8 mg/dL (ref 0.0–1.2)
CALCIUM: 9.4 mg/dL (ref 8.7–10.2)
CO2: 21 mmol/L (ref 20–29)
Chloride: 100 mmol/L (ref 96–106)
Creatinine, Ser: 1.2 mg/dL (ref 0.76–1.27)
GFR calc Af Amer: 80 mL/min/{1.73_m2} (ref >59)
GFR calc non Af Amer: 69 mL/min/{1.73_m2} (ref >59)
Globulin, Total: 2 g/dL (ref 1.5–4.5)
Glucose: 162 mg/dL — ABNORMAL HIGH (ref 65–99)
Potassium: 5 mmol/L (ref 3.5–5.2)
Sodium: 139 mmol/L (ref 134–144)
TOTAL PROTEIN: 6.7 g/dL (ref 6.0–8.5)

## 2018-12-03 LAB — LIPID PANEL
Chol/HDL Ratio: 3 ratio (ref 0.0–5.0)
Cholesterol, Total: 106 mg/dL (ref 100–199)
HDL: 35 mg/dL — ABNORMAL LOW (ref >39)
LDL Calculated: 45 mg/dL (ref 0–99)
Triglycerides: 132 mg/dL (ref 0–149)
VLDL Cholesterol Cal: 26 mg/dL (ref 5–40)

## 2018-12-03 LAB — HEMOGLOBIN A1C
ESTIMATED AVERAGE GLUCOSE: 151 mg/dL
HEMOGLOBIN A1C: 6.9 % — AB (ref 4.8–5.6)

## 2018-12-03 NOTE — Progress Notes (Signed)
Nursing visit for labs only.

## 2018-12-08 ENCOUNTER — Telehealth: Payer: Self-pay | Admitting: Family Medicine

## 2018-12-08 NOTE — Telephone Encounter (Signed)
Copied from [place] [id number]. Topic: General - Other >> Dec 08, 2018  3:00 PM Reyne Dumas L wrote: Reason for CRM:   Pt needs a work note stating that his is in a high risk category for COVID.  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 Pt can be reached at [phone]

## 2018-12-08 NOTE — Telephone Encounter (Signed)
Note has been created and added to my chart.

## 2018-12-26 ENCOUNTER — Telehealth: Payer: Self-pay | Admitting: Family Medicine

## 2018-12-26 NOTE — Telephone Encounter (Signed)
Copied from [place] [id number]. Topic: Quick Communication - Rx Refill/Question >> Dec 26, 2018 11:46 AM Celene Kras A wrote: Medication: Test strips and lancets for one touch verioflex.  Has the patient contacted their pharmacy? Yes.  Pt states pharmacy is requesting a call from Dr. Carlota Raspberry in order to refill. Please advise.  (Agent: If no, request that the patient contact the pharmacy for the refill.) (Agent: If yes, when and what did the pharmacy advise?)  Preferred Pharmacy (with phone number or street name): [address], [address] [place] [place] [address] Phone: [phone] Fax: [phone] Not a 24 hour pharmacy; exact hours not known.    Agent: Please be advised that RX refills may take up to 3 business days. We ask that you follow-up with your pharmacy.

## 2019-01-02 ENCOUNTER — Other Ambulatory Visit: Payer: Self-pay

## 2019-01-02 DIAGNOSIS — E1129 Type 2 diabetes mellitus with other diabetic kidney complication: Secondary | ICD-10-CM

## 2019-01-02 DIAGNOSIS — R809 Proteinuria, unspecified: Principal | ICD-10-CM

## 2019-01-02 MED ORDER — GLUCOSE BLOOD VI STRP: ORAL_STRIP | 12 refills | Status: DC

## 2019-01-02 MED ORDER — LANCETS 30G MISC: 2 refills | Status: DC

## 2019-01-02 NOTE — Telephone Encounter (Signed)
Rx sent to pharmacy   

## 2019-01-02 NOTE — Telephone Encounter (Signed)
Pt called to check status. Pt has not received RX. Please advise

## 2019-01-26 DIAGNOSIS — H3582 Retinal ischemia: Secondary | ICD-10-CM | POA: Diagnosis not present

## 2019-01-26 DIAGNOSIS — H35033 Hypertensive retinopathy, bilateral: Secondary | ICD-10-CM | POA: Diagnosis not present

## 2019-01-26 DIAGNOSIS — E113313 Type 2 diabetes mellitus with moderate nonproliferative diabetic retinopathy with macular edema, bilateral: Secondary | ICD-10-CM | POA: Diagnosis not present

## 2019-01-26 LAB — HM DIABETES EYE EXAM

## 2019-02-05 DIAGNOSIS — E113313 Type 2 diabetes mellitus with moderate nonproliferative diabetic retinopathy with macular edema, bilateral: Secondary | ICD-10-CM | POA: Diagnosis not present

## 2019-02-11 ENCOUNTER — Encounter: Payer: Self-pay | Admitting: Family Medicine

## 2019-02-11 NOTE — Progress Notes (Signed)
Impression-moderate nonproliferative diabetic retinopathy

## 2019-03-02 ENCOUNTER — Telehealth (INDEPENDENT_AMBULATORY_CARE_PROVIDER_SITE_OTHER): Payer: BC Managed Care – PPO | Admitting: Family Medicine

## 2019-03-02 ENCOUNTER — Other Ambulatory Visit: Payer: Self-pay

## 2019-03-02 DIAGNOSIS — E1129 Type 2 diabetes mellitus with other diabetic kidney complication: Secondary | ICD-10-CM

## 2019-03-02 DIAGNOSIS — G629 Polyneuropathy, unspecified: Secondary | ICD-10-CM

## 2019-03-02 DIAGNOSIS — R809 Proteinuria, unspecified: Secondary | ICD-10-CM | POA: Diagnosis not present

## 2019-03-02 DIAGNOSIS — E11319 Type 2 diabetes mellitus with unspecified diabetic retinopathy without macular edema: Secondary | ICD-10-CM

## 2019-03-02 MED ORDER — METFORMIN HCL 850 MG PO TABS: 850.0000 mg | ORAL_TABLET | Freq: Two times a day (BID) | ORAL | 1 refills | Status: DC

## 2019-03-02 MED ORDER — GABAPENTIN 300 MG PO CAPS: ORAL_CAPSULE | ORAL | 1 refills | Status: DC

## 2019-03-02 MED ORDER — GLIPIZIDE 5 MG PO TABS: ORAL_TABLET | ORAL | 1 refills | Status: DC

## 2019-03-02 MED ORDER — LISINOPRIL 2.5 MG PO TABS: 2.5000 mg | ORAL_TABLET | Freq: Every day | ORAL | 0 refills | Status: DC

## 2019-03-02 NOTE — Progress Notes (Signed)
CC-3 month f/u Diabetes- Patient stated he stop the lisinopril due to it threw his vision off and he was seeing black spots and he was feeling down after taking. Patient stated this medication was more for his kidney not his bp. Patient last blood sugar reading was on thurs 02/26/19 it was 99. His last aic 3 months ago was 6.9

## 2019-03-02 NOTE — Patient Instructions (Addendum)
   Discuss lisinopril with your eye specialist at upcoming appointment, and if they are ok with you trying it again, then try the 2.5mg  dose. If side effects at that dose - stop and let me know.   No med changes for now. If any low readings - may need to decrease dose of glipizide. Let me know if that occurs.   Lab visit next week, appointment in 3 months.   If you have lab work done today you will be contacted with your lab results within the next 2 weeks.  If you have not heard from [place] then please contact us. The fastest way to get your results is to register for My Chart.   IF you received an x-ray today, you will receive an invoice from [facility]. Please contact [facility] at [phone] with questions or concerns regarding your invoice.   IF you received labwork today, you will receive an invoice from [place]. Please contact [facility] at [phone] with questions or concerns regarding your invoice.   Our billing staff will not be able to assist you with questions regarding bills from these companies.  You will be contacted with the lab results as soon as they are available. The fastest way to get your results is to activate your My Chart account. Instructions are located on the last page of this paperwork. If you have not heard from [place] regarding the results in 2 weeks, please contact this office.

## 2019-03-02 NOTE — Progress Notes (Signed)
Virtual Visit via Telephone Note  I connected with David Crane on 03/02/19 at 8:12 AM by telephone and verified that I am speaking with the correct person using two identifiers.   I discussed the limitations, risks, security and privacy concerns of performing an evaluation and management service by telephone and the availability of in person appointments. I also discussed with the patient that there may be a patient responsible charge related to this service. The patient expressed understanding and agreed to proceed, consent obtained  Chief complaint:  Diabetes  History of Present Illness: David Crane is a 53 y.o. male  Diabetes: Complicated by microalbuminuria, retinopathy, peripheral neuropathy.  Added lisinopril with microalbuminuria in March.  Took one night, then next day felt run down. Noticed tunnel vision, and sparking on the side. Tried again on the weekend - felt run down, no tunnel vision, slight sparkling vision on periphery. No darkening of vision - has not taken since.  Metformin 883m BID, glipizide 5 mg twice daily. Gabapentin 300 mg 1 each morning, 2 at bedtime for neuropathy.  Few times per week will take afternoon dose.  Microalbumin: elevated ratio 87.9  In 04/2018.  Optho, foot exam, pneumovax: Due for Pneumovax, otherwise up-to-date. Followed by ophthalmology for retinopathy, retina specialist - has been receiving injections for edema. Last injection few weeks ago - again on the 29th.  On lipitor for statin.  Home readings: 90-100 in afternoon. Fasting readings 160-170.  No symptomatic lows. Lowest in 80's.  Trying to become more active and watching diet.   Lab Results  Component Value Date   HGBA1C 6.9 (H) 12/02/2018   HGBA1C 6.8 (H) 08/26/2018   HGBA1C 7.3 (H) 05/13/2018   Lab Results  Component Value Date   LDLCALC 45 12/02/2018   CREATININE 1.20 12/02/2018   Constitutional: Negative for fatigue and unexpected weight change.  Eyes:  Negative for visual disturbance.  Respiratory: Negative for cough, chest tightness and shortness of breath.   Cardiovascular: Negative for chest pain, palpitations and leg swelling.  Gastrointestinal: Negative for abdominal pain and blood in stool.  Neurological: Negative for dizziness, light-headedness and headaches.        Patient Active Problem List   Diagnosis Date Noted  . Diabetic nephropathy ([place] 05/01/2017  . Diabetes ([place] 05/01/2017  . Peripheral neuropathy 05/01/2017   Past Medical History:  Diagnosis Date  . Diabetes mellitus without complication ([place]   . GERD (gastroesophageal reflux disease)    Past Surgical History:  Procedure Laterality Date  . Growth removed Left   . TONSILLECTOMY    . WISDOM TOOTH EXTRACTION     No Known Allergies Prior to Admission medications   Medication Sig Start Date End Date Taking? Authorizing Provider  atorvastatin (LIPITOR) 10 MG tablet Take 1 tablet (10 mg total) by mouth daily. 11/25/18  Yes GWendie Agreste MD  blood glucose meter kit and supplies Dispense based on patient and insurance preference. Check twice per day. FOR ICD-9 250.00, 250.01). 04/29/17  Yes GWendie Agreste MD  gabapentin (NEURONTIN) 300 MG capsule TAKE 1 CAPSULE BY MOUTH IN THE MORNING AND 2 CAPSULES AT BEDTIME 11/25/18  Yes GWendie Agreste MD  glipiZIDE (GLUCOTROL) 5 MG tablet TAKE 1 TABLET BY MOUTH TWICE DAILY BEFORE A MEAL 11/25/18  Yes GWendie Agreste MD  glucose blood test strip Use as instructed 01/02/19  Yes GWendie Agreste MD  Lancets 30G MISC Check sugar twice a day before meals 01/02/19  Yes GMerri Ray  R, MD  metFORMIN (GLUCOPHAGE) 850 MG tablet Take 1 tablet (850 mg total) by mouth 2 (two) times daily with a meal. 11/25/18  Yes Wendie Agreste, MD   Social History   Socioeconomic History  . Marital status: Married    Spouse name: Not on file  . Number of children: Not on file  . Years of education: Not on file  . Highest education  level: Not on file  Occupational History  . Not on file  Social Needs  . Financial resource strain: Not on file  . Food insecurity    Worry: Not on file    Inability: Not on file  . Transportation needs    Medical: Not on file    Non-medical: Not on file  Tobacco Use  . Smoking status: Never Smoker  . Smokeless tobacco: Never Used  Substance and Sexual Activity  . Alcohol use: Yes    Comment: occ  . Drug use: No  . Sexual activity: Yes  Lifestyle  . Physical activity    Days per week: Not on file    Minutes per session: Not on file  . Stress: Not on file  Relationships  . Social Herbalist on phone: Not on file    Gets together: Not on file    Attends religious service: Not on file    Active member of club or organization: Not on file    Attends meetings of clubs or organizations: Not on file    Relationship status: Not on file  . Intimate partner violence    Fear of current or ex partner: Not on file    Emotionally abused: Not on file    Physically abused: Not on file    Forced sexual activity: Not on file  Other Topics Concern  . Not on file  Social History Narrative  . Not on file     Observations/Objective: No distress, appropriate responses, all questions answered with understanding expressed.  Assessment and Plan: Type 2 diabetes mellitus with microalbuminuria, without long-term current use of insulin (HCC) - Plan: lisinopril (ZESTRIL) 2.5 MG tablet, glipiZIDE (GLUCOTROL) 5 MG tablet, Hemoglobin A1c, metFORMIN (GLUCOPHAGE) 850 MG tablet,   -Overall stable symptoms with current meds, plan for A1c next week.  Continue good work with diet/increase activity.  Monitor for hypoglycemia and if that occurs decrease dose of glipizide.  -A 5 mg lisinopril dose.  May have been too strong.  Plans on follow-up with his retina specialist in the next 2 weeks.  Advised to discuss with eye specialist, then if cleared to restart can try 2.5 mg dose.  If side effects at  that dose, stop altogether.  -Recheck 3 months  Peripheral polyneuropathy - Plan: gabapentin (NEURONTIN) 300 MG capsule,   -Episodic afternoon symptoms during the week, otherwise controlled with 3 total pills per day.  Will change prescription to allow up to 4 pills/day.  Lab visit next week, follow-up in 3 months   Follow Up Instructions: 1 week labs, OV in 3 months.  Patient Instructions     Discuss lisinopril with your eye specialist at upcoming appointment, and if they are ok with you trying it again, then try the 2.58m dose. If side effects at that dose - stop and let me know.   No med changes for now. If any low readings - may need to decrease dose of glipizide. Let me know if that occurs.   Lab visit next week, appointment in 3 months.  If you have lab work done today you will be contacted with your lab results within the next 2 weeks.  If you have not heard from [place] then please contact us. The fastest way to get your results is to register for My Chart.   IF you received an x-ray today, you will receive an invoice from [facility]. Please contact [facility]iology at [phone] with questions or concerns regarding your invoice.   IF you received labwork today, you will receive an invoice from [place]. Please contact [facility] at [phone] with questions or concerns regarding your invoice.   Our billing staff will not be able to assist you with questions regarding bills from these companies.  You will be contacted with the lab results as soon as they are available. The fastest way to get your results is to activate your My Chart account. Instructions are located on the last page of this paperwork. If you have not heard from [place] regarding the results in 2 weeks, please contact this office.         I discussed the assessment and treatment plan with the patient. The patient was provided an opportunity to ask questions and all were answered. The patient agreed  with the plan and demonstrated an understanding of the instructions.   The patient was advised to call back or seek an in-person evaluation if the symptoms worsen or if the condition fails to improve as anticipated.  I provided 62mnutes of non-face-to-face time during this encounter.  Signed,   JMerri Ray MD [facility] [place]  03/02/19

## 2019-03-11 ENCOUNTER — Other Ambulatory Visit: Payer: Self-pay | Admitting: Family Medicine

## 2019-03-11 ENCOUNTER — Other Ambulatory Visit: Payer: Self-pay

## 2019-03-11 ENCOUNTER — Ambulatory Visit (INDEPENDENT_AMBULATORY_CARE_PROVIDER_SITE_OTHER): Payer: BC Managed Care – PPO | Admitting: Family Medicine

## 2019-03-11 DIAGNOSIS — E1129 Type 2 diabetes mellitus with other diabetic kidney complication: Secondary | ICD-10-CM

## 2019-03-11 DIAGNOSIS — R809 Proteinuria, unspecified: Secondary | ICD-10-CM

## 2019-03-11 MED ORDER — LISINOPRIL 2.5 MG PO TABS: 2.5000 mg | ORAL_TABLET | Freq: Every day | ORAL | 0 refills | Status: DC

## 2019-03-12 LAB — HEMOGLOBIN A1C
Est. average glucose Bld gHb Est-mCnc: 146 mg/dL
Hgb A1c MFr Bld: 6.7 % — ABNORMAL HIGH (ref 4.8–5.6)

## 2019-03-16 DIAGNOSIS — E113313 Type 2 diabetes mellitus with moderate nonproliferative diabetic retinopathy with macular edema, bilateral: Secondary | ICD-10-CM | POA: Diagnosis not present

## 2019-03-16 DIAGNOSIS — H35033 Hypertensive retinopathy, bilateral: Secondary | ICD-10-CM | POA: Diagnosis not present

## 2019-03-16 DIAGNOSIS — H3582 Retinal ischemia: Secondary | ICD-10-CM | POA: Diagnosis not present

## 2019-04-30 DIAGNOSIS — E113313 Type 2 diabetes mellitus with moderate nonproliferative diabetic retinopathy with macular edema, bilateral: Secondary | ICD-10-CM | POA: Diagnosis not present

## 2019-04-30 DIAGNOSIS — E113311 Type 2 diabetes mellitus with moderate nonproliferative diabetic retinopathy with macular edema, right eye: Secondary | ICD-10-CM | POA: Diagnosis not present

## 2019-05-04 ENCOUNTER — Telehealth: Payer: Self-pay | Admitting: Family Medicine

## 2019-05-04 DIAGNOSIS — G629 Polyneuropathy, unspecified: Secondary | ICD-10-CM

## 2019-05-04 NOTE — Telephone Encounter (Signed)
Insurance will not cover the gabapentin because the number of tablets doesn't match the instructions.  Pt needs the instructions changed to take 2 in the morning and 2 in the evening.  Insurance will only cover 270 pills written the way it is not the total 360 pills.

## 2019-05-04 NOTE — Telephone Encounter (Signed)
Per pt insurance.....(copy and paste)will not cover the gabapentin because the number of tablets doesn't match the instructions.  Pt needs the instructions changed to take 2 in the morning and 2 in the evening.  Insurance will only cover 270 pills written the way it is not the total 360 pills.

## 2019-05-05 MED ORDER — GABAPENTIN 300 MG PO CAPS: ORAL_CAPSULE | ORAL | 1 refills | Status: DC

## 2019-05-05 NOTE — Telephone Encounter (Signed)
Discussed at June visit 4 pills/day. Prescription on June 15, #360 but I do see that it still indicated 1 capsule in the morning.  Will change prescription right now.

## 2019-06-04 DIAGNOSIS — E113313 Type 2 diabetes mellitus with moderate nonproliferative diabetic retinopathy with macular edema, bilateral: Secondary | ICD-10-CM | POA: Diagnosis not present

## 2019-06-15 ENCOUNTER — Ambulatory Visit (INDEPENDENT_AMBULATORY_CARE_PROVIDER_SITE_OTHER): Payer: BC Managed Care – PPO | Admitting: Family Medicine

## 2019-06-15 ENCOUNTER — Other Ambulatory Visit: Payer: Self-pay

## 2019-06-15 ENCOUNTER — Encounter: Payer: Self-pay | Admitting: Family Medicine

## 2019-06-15 VITALS — BP 129/87 | Temp 98.7°F | Wt 272.6 lb

## 2019-06-15 DIAGNOSIS — R809 Proteinuria, unspecified: Secondary | ICD-10-CM | POA: Diagnosis not present

## 2019-06-15 DIAGNOSIS — G629 Polyneuropathy, unspecified: Secondary | ICD-10-CM

## 2019-06-15 DIAGNOSIS — E11319 Type 2 diabetes mellitus with unspecified diabetic retinopathy without macular edema: Secondary | ICD-10-CM | POA: Diagnosis not present

## 2019-06-15 DIAGNOSIS — E1129 Type 2 diabetes mellitus with other diabetic kidney complication: Secondary | ICD-10-CM | POA: Diagnosis not present

## 2019-06-15 DIAGNOSIS — E785 Hyperlipidemia, unspecified: Secondary | ICD-10-CM | POA: Diagnosis not present

## 2019-06-15 LAB — COMPREHENSIVE METABOLIC PANEL
ALT: 16 IU/L (ref 0–44)
AST: 19 IU/L (ref 0–40)
Albumin/Globulin Ratio: 2 (ref 1.2–2.2)
Albumin: 4.8 g/dL (ref 3.8–4.9)
Alkaline Phosphatase: 49 IU/L (ref 39–117)
BUN/Creatinine Ratio: 21 — ABNORMAL HIGH (ref 9–20)
BUN: 21 mg/dL (ref 6–24)
Bilirubin Total: 0.8 mg/dL (ref 0.0–1.2)
CO2: 22 mmol/L (ref 20–29)
Calcium: 9.7 mg/dL (ref 8.7–10.2)
Chloride: 101 mmol/L (ref 96–106)
Creatinine, Ser: 0.99 mg/dL (ref 0.76–1.27)
GFR calc Af Amer: 100 mL/min/{1.73_m2} (ref >59)
GFR calc non Af Amer: 87 mL/min/{1.73_m2} (ref >59)
Globulin, Total: 2.4 g/dL (ref 1.5–4.5)
Glucose: 186 mg/dL — ABNORMAL HIGH (ref 65–99)
Potassium: 4.7 mmol/L (ref 3.5–5.2)
Sodium: 140 mmol/L (ref 134–144)
Total Protein: 7.2 g/dL (ref 6.0–8.5)

## 2019-06-15 LAB — LIPID PANEL
Chol/HDL Ratio: 3.2 ratio (ref 0.0–5.0)
Cholesterol, Total: 120 mg/dL (ref 100–199)
HDL: 38 mg/dL — ABNORMAL LOW (ref >39)
LDL Chol Calc (NIH): 62 mg/dL (ref 0–99)
Triglycerides: 111 mg/dL (ref 0–149)
VLDL Cholesterol Cal: 20 mg/dL (ref 5–40)

## 2019-06-15 LAB — HEMOGLOBIN A1C
Est. average glucose Bld gHb Est-mCnc: 137 mg/dL
Hgb A1c MFr Bld: 6.4 % — ABNORMAL HIGH (ref 4.8–5.6)

## 2019-06-15 MED ORDER — LISINOPRIL 2.5 MG PO TABS: 2.5000 mg | ORAL_TABLET | Freq: Every day | ORAL | 1 refills | Status: DC

## 2019-06-15 MED ORDER — METFORMIN HCL 850 MG PO TABS: 850.0000 mg | ORAL_TABLET | Freq: Two times a day (BID) | ORAL | 1 refills | Status: DC

## 2019-06-15 MED ORDER — ATORVASTATIN CALCIUM 10 MG PO TABS: 10.0000 mg | ORAL_TABLET | Freq: Every day | ORAL | 1 refills | Status: DC

## 2019-06-15 MED ORDER — GLIPIZIDE 5 MG PO TABS: ORAL_TABLET | ORAL | 1 refills | Status: DC

## 2019-06-15 MED ORDER — LISINOPRIL 2.5 MG PO TABS: 2.5000 mg | ORAL_TABLET | Freq: Every day | ORAL | 0 refills | Status: DC

## 2019-06-15 NOTE — Patient Instructions (Addendum)
  No med changes for now. Healthy snack if needed in afternoon. Complex carbohydrates better than simple carbohydrates at lunch to lessen risk of sugars dropping.   Thanks for coming in today - recheck in 3 months.   If you have lab work done today you will be contacted with your lab results within the next 2 weeks.  If you have not heard from [place] then please contact us. The fastest way to get your results is to register for My Chart.   IF you received an x-ray today, you will receive an invoice from [facility]. Please contact [facility] at [phone] with questions or concerns regarding your invoice.   IF you received labwork today, you will receive an invoice from [place]. Please contact [facility] at [phone] with questions or concerns regarding your invoice.   Our billing staff will not be able to assist you with questions regarding bills from these companies.  You will be contacted with the lab results as soon as they are available. The fastest way to get your results is to activate your My Chart account. Instructions are located on the last page of this paperwork. If you have not heard from [place] regarding the results in 2 weeks, please contact this office.

## 2019-06-15 NOTE — Addendum Note (Signed)
Addended by: Kittie Plater, Margot Oriordan HUA on: 06/15/2019 08:56 AM   Modules accepted: Orders

## 2019-06-15 NOTE — Progress Notes (Signed)
Subjective:    Patient ID: David Crane, male    DOB: 1965/12/13, 53 y.o.   MRN: [id number]  HPI David Crane is a 53 y.o. male Presents today for: Chief Complaint  Patient presents with   Diabetes    3 month f/u    Diabetes: Complicated by peripheral neuropathy, microalbuminuria, retinopathy.  Metformin 850 mg twice daily, glipizide 5 mg daily.  Gabapentin 600 mg in the morning, 600 mg at night for neuropathic symptoms.  He is on statin and low-dose ACE inhibitor for nephro protection.  Usually 1 gabapentin in am, 2 at night. Only occasional extra pill in afternoon (total 4 per day)  No new side effects with meds. No true hypoglycemia.  Home readings 78-80 in afternoon.  Tolerating low dose lisinopril.   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 Has ongoing injection/treatments with retina specialist.   Diabetic Foot Exam - Simple   Simple Foot Form Diabetic Foot exam was performed with the following findings: Yes 06/15/2019  8:04 AM  Visual Inspection Sensation Testing Pulse Check Comments   Due for Pneumovax, declined previously.  Up to date on optho exam.  Some increase stress at work, still watching diet.   Lab Results  Component Value Date   HGBA1C 6.7 (H) 03/11/2019   HGBA1C 6.9 (H) 12/02/2018   HGBA1C 6.8 (H) 08/26/2018   Lab Results  Component Value Date   LDLCALC 45 12/02/2018   CREATININE 1.20 12/02/2018   Wt Readings from Last 3 Encounters:  11/25/18 271 lb 9.6 oz (123.2 kg)  08/26/18 272 lb (123.4 kg)  07/04/18 272 lb 6.4 oz (123.6 kg)    Hyperlipidemia:  Lab Results  Component Value Date   CHOL 106 12/02/2018   HDL 35 (L) 12/02/2018   LDLCALC 45 12/02/2018   TRIG 132 12/02/2018   CHOLHDL 3.0 12/02/2018   Lab Results  Component Value Date   ALT 17 12/02/2018   AST 19 12/02/2018   ALKPHOS 43 12/02/2018   BILITOT 0.8 12/02/2018  Lipitor 10 mg daily.  No new myalgias.   Health maintenance: Due for Pneumovax, flu vaccine, HIV screening.  Had tdap in June.  Plans on flu vaccine in next month. Declines HIV screening.     Patient Active Problem List   Diagnosis Date Noted   Diabetic nephropathy ([place]) 05/01/2017   Diabetes ([place]) 05/01/2017   Peripheral neuropathy 05/01/2017   Past Medical History:  Diagnosis Date   Diabetes mellitus without complication (HCC)    GERD (gastroesophageal reflux disease)    Past Surgical History:  Procedure Laterality Date   Growth removed Left    TONSILLECTOMY     WISDOM TOOTH EXTRACTION     No Known Allergies Prior to Admission medications   Medication Sig Start Date End Date Taking? Authorizing Provider  atorvastatin (LIPITOR) 10 MG tablet Take 1 tablet (10 mg total) by mouth daily. 11/25/18   Wendie Agreste, MD  blood glucose meter kit and supplies Dispense based on patient and insurance preference. Check twice per day. FOR ICD-9 250.00, 250.01). 04/29/17   Wendie Agreste, MD  gabapentin (NEURONTIN) 300 MG capsule TAKE 2 CAPSULEs BY MOUTH IN THE MORNING AND 2 CAPSULES AT BEDTIME 05/05/19   Wendie Agreste, MD  glipiZIDE (GLUCOTROL) 5 MG tablet TAKE 1 TABLET BY MOUTH TWICE DAILY BEFORE A MEAL 03/02/19   Wendie Agreste, MD  glucose blood  test strip Use as instructed 01/02/19   Wendie Agreste, MD  Lancets 30G MISC Check sugar twice a day before meals 01/02/19   Wendie Agreste, MD  lisinopril (ZESTRIL) 2.5 MG tablet Take 1 tablet (2.5 mg total) by mouth daily. 03/11/19   Wendie Agreste, MD  metFORMIN (GLUCOPHAGE) 850 MG tablet Take 1 tablet (850 mg total) by mouth 2 (two) times daily with a meal. 03/02/19   Wendie Agreste, MD   Social History   Socioeconomic History   Marital status: Married    Spouse name: Not on file   Number of children: Not on file   Years of education: Not on file   Highest education level: Not on  file  Occupational History   Not on file  Social Needs   Financial resource strain: Not on file   Food insecurity    Worry: Not on file    Inability: Not on file   Transportation needs    Medical: Not on file    Non-medical: Not on file  Tobacco Use   Smoking status: Never Smoker   Smokeless tobacco: Never Used  Substance and Sexual Activity   Alcohol use: Yes    Comment: occ   Drug use: No   Sexual activity: Yes  Lifestyle   Physical activity    Days per week: Not on file    Minutes per session: Not on file   Stress: Not on file  Relationships   Social connections    Talks on phone: Not on file    Gets together: Not on file    Attends religious service: Not on file    Active member of club or organization: Not on file    Attends meetings of clubs or organizations: Not on file    Relationship status: Not on file   Intimate partner violence    Fear of current or ex partner: Not on file    Emotionally abused: Not on file    Physically abused: Not on file    Forced sexual activity: Not on file  Other Topics Concern   Not on file  Social History Narrative   Not on file    Review of Systems  Constitutional: Negative for fatigue and unexpected weight change.  Eyes: Negative for visual disturbance.  Respiratory: Negative for cough, chest tightness and shortness of breath.   Cardiovascular: Negative for chest pain, palpitations and leg swelling.  Gastrointestinal: Negative for abdominal pain and blood in stool.  Neurological: Negative for dizziness, light-headedness and headaches.       Objective:   Physical Exam Vitals signs reviewed.  Constitutional:      Appearance: He is well-developed.  HENT:     Head: Normocephalic and atraumatic.  Eyes:     Pupils: Pupils are equal, round, and reactive to light.  Neck:     Vascular: No carotid bruit or JVD.  Cardiovascular:     Rate and Rhythm: Normal rate and regular rhythm.     Heart sounds: Normal  heart sounds. No murmur.  Pulmonary:     Effort: Pulmonary effort is normal.     Breath sounds: Normal breath sounds. No rales.  Skin:    General: Skin is warm and dry.  Neurological:     Mental Status: He is alert and oriented to person, place, and time.    Vitals:   06/15/19 0817  BP: 129/87  Temp: 98.7 F (37.1 C)  TempSrc: Oral  SpO2: 98%  Weight:  272 lb 9.6 oz (123.7 kg)       Assessment & Plan:   David Crane is a 53 y.o. male Type 2 diabetes mellitus with microalbuminuria, without long-term current use of insulin ([place]) - Plan: HM Diabetes Foot Exam, lisinopril (ZESTRIL) 2.5 MG tablet, Hemoglobin A1c, Microalbumin / creatinine urine ratio, atorvastatin (LIPITOR) 10 MG tablet, metFORMIN (GLUCOPHAGE) 850 MG tablet, glipiZIDE (GLUCOTROL) 5 MG tablet  -Tolerating current regimen.  Ideal goal less than 6.5 if tolerated given microalbuminuria, diabetic retinopathy, peripheral neuropathy.  Balance with risk of hypoglycemia.  Complex carbohydrate discussed lunchtime or healthy snack in the afternoon to lessen risk of lows.  No other changes for now, labs pending  Hyperlipidemia, unspecified hyperlipidemia type - Plan: Comprehensive metabolic panel, Lipid Panel, atorvastatin (LIPITOR) 10 MG tablet  - Stable, tolerating current regimen. Medications refilled. Labs pending as above.   Peripheral polyneuropathy  -Stable with usual dose 300 mg every morning, 600 mg nightly of gabapentin.  Occasional additional afternoon dose.  Continue same.  Diabetic retinopathy associated with type 2 diabetes mellitus, macular edema presence unspecified, unspecified laterality, unspecified retinopathy severity ([place]) - Plan: Hemoglobin A1c  -Has ongoing care with retina specialist..  Continue same.  Health maintenance: Plans on flu vaccine in the next month.  Declines Pneumovax.  Up-to-date on tetanus, will obtain records.  Meds ordered this encounter  Medications   lisinopril (ZESTRIL) 2.5 MG  tablet    Sig: Take 1 tablet (2.5 mg total) by mouth daily.    Dispense:  90 tablet    Refill:  0   atorvastatin (LIPITOR) 10 MG tablet    Sig: Take 1 tablet (10 mg total) by mouth daily.    Dispense:  90 tablet    Refill:  1   metFORMIN (GLUCOPHAGE) 850 MG tablet    Sig: Take 1 tablet (850 mg total) by mouth 2 (two) times daily with a meal.    Dispense:  180 tablet    Refill:  1   glipiZIDE (GLUCOTROL) 5 MG tablet    Sig: TAKE 1 TABLET BY MOUTH TWICE DAILY BEFORE A MEAL    Dispense:  180 tablet    Refill:  1   Patient Instructions    No med changes for now. Healthy snack if needed in afternoon. Complex carbohydrates better than simple carbohydrates at lunch to lessen risk of sugars dropping.   Thanks for coming in today - recheck in 3 months.   If you have lab work done today you will be contacted with your lab results within the next 2 weeks.  If you have not heard from [place] then please contact us. The fastest way to get your results is to register for My Chart.   IF you received an x-ray today, you will receive an invoice from [facility]. Please contact [facility] at [phone] with questions or concerns regarding your invoice.   IF you received labwork today, you will receive an invoice from [place]. Please contact [facility] at [phone] with questions or concerns regarding your invoice.   Our billing staff will not be able to assist you with questions regarding bills from these companies.  You will be contacted with the lab results as soon as they are available. The fastest way to get your results is to activate your My Chart account. Instructions are located on the last page of this paperwork. If you have not heard from [place] regarding the results in 2 weeks, please contact this office.  Signed,   [name], MD [facility].  06/15/19 8:38 AM

## 2019-06-15 NOTE — Addendum Note (Signed)
Addended by: [name] R on: 06/15/2019 08:45 AM   Modules accepted: Orders

## 2019-06-16 LAB — MICROALBUMIN / CREATININE URINE RATIO
Creatinine, Urine: 116.3 mg/dL
Microalb/Creat Ratio: 83 mg/g creat — ABNORMAL HIGH (ref 0–29)
Microalbumin, Urine: 96.7 ug/mL

## 2019-06-17 DIAGNOSIS — E113311 Type 2 diabetes mellitus with moderate nonproliferative diabetic retinopathy with macular edema, right eye: Secondary | ICD-10-CM | POA: Diagnosis not present

## 2019-06-17 DIAGNOSIS — E113313 Type 2 diabetes mellitus with moderate nonproliferative diabetic retinopathy with macular edema, bilateral: Secondary | ICD-10-CM | POA: Diagnosis not present

## 2019-07-02 ENCOUNTER — Telehealth (INDEPENDENT_AMBULATORY_CARE_PROVIDER_SITE_OTHER): Payer: BC Managed Care – PPO | Admitting: Family Medicine

## 2019-07-02 DIAGNOSIS — M545 Low back pain, unspecified: Secondary | ICD-10-CM

## 2019-07-02 DIAGNOSIS — M62830 Muscle spasm of back: Secondary | ICD-10-CM

## 2019-07-02 MED ORDER — METHOCARBAMOL 500 MG PO TABS: 500.0000 mg | ORAL_TABLET | Freq: Four times a day (QID) | ORAL | 1 refills | Status: DC

## 2019-07-02 NOTE — Progress Notes (Signed)
Spoke with pt and he Informed me that he has been having back spasms x 2 days that just flared up.  He states he would like a muscle relaxed to her with his back spasms. 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

## 2019-07-02 NOTE — Progress Notes (Signed)
Virtual Visit via Telephone Note  I connected with David Crane on 07/02/19 at 11:25 AM by telephone and verified that I am speaking with the correct person using two identifiers.   I discussed the limitations, risks, security and privacy concerns of performing an evaluation and management service by telephone and the availability of in person appointments. I also discussed with the patient that there may be a patient responsible charge related to this service. The patient expressed understanding and agreed to proceed, consent obtained  Chief complaint: Back spasms  History of Present Illness: David Crane is a 53 y.o. male  Back spasms: Low back. Every now and then will have some back spasms. Job requires a lot of driving - new car recently.  Most recent flair yesterday, no injury. No fall. Constant twisting with work, but no specific injury. Thinks new seat may be an issue.  Last spasm was about 4 years ago.  Will take tylenol on occasion in past.  Had leftover methocarbamol 55m from about 5 years ago. Took 2 at a time - took twice yesterday again at 5am today. Helps sx's. improves in few days usually. feeling better.   No bowel or bladder incontinence, no saddle anesthesia, no lower extremity weakness.   No rash on back.  Tx: heat.   No weight loss, night sweats or fevers.  No recent imaging.  No hematuria, no dysuria.  No leg radiation.     Patient Active Problem List   Diagnosis Date Noted  . Diabetic nephropathy ([place] 05/01/2017  . Diabetes ([place] 05/01/2017  . Peripheral neuropathy 05/01/2017   Past Medical History:  Diagnosis Date  . Diabetes mellitus without complication ([place]   . GERD (gastroesophageal reflux disease)    Past Surgical History:  Procedure Laterality Date  . Growth removed Left   . TONSILLECTOMY    . WISDOM TOOTH EXTRACTION     No Known Allergies Prior to Admission medications   Medication Sig Start Date End Date Taking?  Authorizing Provider  atorvastatin (LIPITOR) 10 MG tablet Take 1 tablet (10 mg total) by mouth daily. 06/15/19  Yes GWendie Agreste MD  blood glucose meter kit and supplies Dispense based on patient and insurance preference. Check twice per day. FOR ICD-9 250.00, 250.01). 04/29/17  Yes GWendie Agreste MD  gabapentin (NEURONTIN) 300 MG capsule TAKE 2 CAPSULEs BY MOUTH IN THE MORNING AND 2 CAPSULES AT BEDTIME 05/05/19  Yes GWendie Agreste MD  glipiZIDE (GLUCOTROL) 5 MG tablet TAKE 1 TABLET BY MOUTH TWICE DAILY BEFORE A MEAL 06/15/19  Yes GWendie Agreste MD  glucose blood test strip Use as instructed 01/02/19  Yes GWendie Agreste MD  Lancets 30G MISC Check sugar twice a day before meals 01/02/19  Yes GWendie Agreste MD  lisinopril (ZESTRIL) 2.5 MG tablet Take 1 tablet (2.5 mg total) by mouth daily. 06/15/19  Yes GWendie Agreste MD  metFORMIN (GLUCOPHAGE) 850 MG tablet Take 1 tablet (850 mg total) by mouth 2 (two) times daily with a meal. 06/15/19  Yes GWendie Agreste MD  methocarbamol (ROBAXIN) 500 MG tablet Take 500 mg by mouth 4 (four) times daily.   Yes [provider]   Social History   Socioeconomic History  . Marital status: Married    Spouse name: Not on file  . Number of children: Not on file  . Years of education: Not on file  . Highest education level: Not on file  Occupational History  .  Not on file  Social Needs  . Financial resource strain: Not on file  . Food insecurity    Worry: Not on file    Inability: Not on file  . Transportation needs    Medical: Not on file    Non-medical: Not on file  Tobacco Use  . Smoking status: Never Smoker  . Smokeless tobacco: Never Used  Substance and Sexual Activity  . Alcohol use: Yes    Comment: occ  . Drug use: No  . Sexual activity: Yes  Lifestyle  . Physical activity    Days per week: Not on file    Minutes per session: Not on file  . Stress: Not on file  Relationships  . Social Herbalist  on phone: Not on file    Gets together: Not on file    Attends religious service: Not on file    Active member of club or organization: Not on file    Attends meetings of clubs or organizations: Not on file    Relationship status: Not on file  . Intimate partner violence    Fear of current or ex partner: Not on file    Emotionally abused: Not on file    Physically abused: Not on file    Forced sexual activity: Not on file  Other Topics Concern  . Not on file  Social History Narrative  . Not on file    Observations/Objective: No distress, appropriate responses There were no vitals filed for this visit.   Assessment and Plan: Low back pain without sciatica, unspecified back pain laterality, unspecified chronicity - Plan: methocarbamol (ROBAXIN) 500 MG tablet  Back muscle spasm - Plan: methocarbamol (ROBAXIN) 500 MG tablet  -rare low back pain/spasm, with recent flare, no known injury, no red flags on history. Improving.   -refilled robaxin for prn use, but rtc precautions if not improving into next week or recurrence.  Would then recommend in office exam, possible imaging or PT.   Follow Up Instructions: If not improving into next week.   I discussed the assessment and treatment plan with the patient. The patient was provided an opportunity to ask questions and all were answered. The patient agreed with the plan and demonstrated an understanding of the instructions.   The patient was advised to call back or seek an in-person evaluation if the symptoms worsen or if the condition fails to improve as anticipated.  I provided 16 minutes of non-face-to-face time during this encounter.  Signed,   [name], MD [facility].  07/02/19

## 2019-07-16 DIAGNOSIS — E113313 Type 2 diabetes mellitus with moderate nonproliferative diabetic retinopathy with macular edema, bilateral: Secondary | ICD-10-CM | POA: Diagnosis not present

## 2019-07-16 DIAGNOSIS — E113311 Type 2 diabetes mellitus with moderate nonproliferative diabetic retinopathy with macular edema, right eye: Secondary | ICD-10-CM | POA: Diagnosis not present

## 2019-08-17 DIAGNOSIS — E113313 Type 2 diabetes mellitus with moderate nonproliferative diabetic retinopathy with macular edema, bilateral: Secondary | ICD-10-CM | POA: Diagnosis not present

## 2019-09-14 DIAGNOSIS — Z20828 Contact with and (suspected) exposure to other viral communicable diseases: Secondary | ICD-10-CM | POA: Diagnosis not present

## 2019-09-14 DIAGNOSIS — U071 COVID-19: Secondary | ICD-10-CM | POA: Diagnosis not present

## 2019-09-14 DIAGNOSIS — Z9189 Other specified personal risk factors, not elsewhere classified: Secondary | ICD-10-CM | POA: Diagnosis not present

## 2019-09-17 DIAGNOSIS — U071 COVID-19: Secondary | ICD-10-CM | POA: Diagnosis not present

## 2019-09-19 DIAGNOSIS — Z8619 Personal history of other infectious and parasitic diseases: Secondary | ICD-10-CM | POA: Diagnosis not present

## 2019-09-19 DIAGNOSIS — Z9189 Other specified personal risk factors, not elsewhere classified: Secondary | ICD-10-CM | POA: Diagnosis not present

## 2019-09-24 ENCOUNTER — Ambulatory Visit: Payer: BC Managed Care – PPO | Admitting: Family Medicine

## 2019-10-12 ENCOUNTER — Other Ambulatory Visit: Payer: Self-pay

## 2019-10-12 ENCOUNTER — Ambulatory Visit (INDEPENDENT_AMBULATORY_CARE_PROVIDER_SITE_OTHER): Payer: BC Managed Care – PPO | Admitting: Family Medicine

## 2019-10-12 ENCOUNTER — Encounter: Payer: Self-pay | Admitting: Family Medicine

## 2019-10-12 VITALS — BP 124/78 | HR 111 | Temp 97.9°F | Ht 75.0 in | Wt 272.0 lb

## 2019-10-12 DIAGNOSIS — R809 Proteinuria, unspecified: Secondary | ICD-10-CM

## 2019-10-12 DIAGNOSIS — E1129 Type 2 diabetes mellitus with other diabetic kidney complication: Secondary | ICD-10-CM | POA: Diagnosis not present

## 2019-10-12 DIAGNOSIS — E785 Hyperlipidemia, unspecified: Secondary | ICD-10-CM | POA: Diagnosis not present

## 2019-10-12 LAB — POCT GLYCOSYLATED HEMOGLOBIN (HGB A1C): Hemoglobin A1C: 6.7 % — AB (ref 4.0–5.6)

## 2019-10-12 NOTE — Progress Notes (Signed)
Subjective:  Patient ID: David Crane, male    DOB: Jul 04, 1966  Age: 54 y.o. MRN: [id number]  CC:  Chief Complaint  Patient presents with  . Follow-up    on pt's Diabetes. pt hasn't had any episodes of High/Low blood sugar. pt feels well. pt states he is less active and that he feels as though his A1C is probably elivated.     HPI David Crane presents for   Diabetes: Complicated by peripheral neuropathy, microalbuminuria, retinopathy - followed by retina specialist - has appt Thursday.  Gabapentin 1 in the morning, 2 at night for peripheral neuropathic symptoms.  Treated with metformin 850 mg twice daily, glipizide 5 mg BID with meals.  Dietary changes recommended last visit, continue same regimen. He is on ACE inhibitor as well as statin. Recent readings: 120 fasting, no postprandials. No symptomatic lows. No recentt readings. Less active off work - has planned on increasing activity.  Rare middday dose of gabapentin - not needed with less walking/work.  Microalbumin:  Lab Results  Component Value Date   LABMICR 96.7 06/15/2019   LABMICR 224.2 05/13/2018   Optho, foot exam, pneumovax: Due for pneumovax, flu vaccines - declines today.   Lab Results  Component Value Date   HGBA1C 6.7 (A) 10/12/2019   HGBA1C 6.4 (H) 06/15/2019   HGBA1C 6.7 (H) 03/11/2019   Lab Results  Component Value Date   LDLCALC 62 06/15/2019   CREATININE 0.99 06/15/2019   Social stressors - lost job 12/15, had to declare bankrupcy. Had covid infection mid December, min congestion. No symptoms now.  Hanging in there - denies depression. Is collecting unemployment. Will be on new insurance - high deductible.    History Patient Active Problem List   Diagnosis Date Noted  . Diabetic nephropathy ([place]) 05/01/2017  . Diabetes ([place]) 05/01/2017  . Peripheral neuropathy 05/01/2017   Past Medical History:  Diagnosis Date  . Diabetes mellitus without complication ([place])   . GERD  (gastroesophageal reflux disease)    Past Surgical History:  Procedure Laterality Date  . Growth removed Left   . TONSILLECTOMY    . WISDOM TOOTH EXTRACTION     No Known Allergies Prior to Admission medications   Medication Sig Start Date End Date Taking? Authorizing Provider  atorvastatin (LIPITOR) 10 MG tablet Take 1 tablet (10 mg total) by mouth daily. 06/15/19  Yes Wendie Agreste, MD  blood glucose meter kit and supplies Dispense based on patient and insurance preference. Check twice per day. FOR ICD-9 250.00, 250.01). 04/29/17  Yes Wendie Agreste, MD  gabapentin (NEURONTIN) 300 MG capsule TAKE 2 CAPSULEs BY MOUTH IN THE MORNING AND 2 CAPSULES AT BEDTIME 05/05/19  Yes Wendie Agreste, MD  glipiZIDE (GLUCOTROL) 5 MG tablet TAKE 1 TABLET BY MOUTH TWICE DAILY BEFORE A MEAL 06/15/19  Yes Wendie Agreste, MD  glucose blood test strip Use as instructed 01/02/19  Yes Wendie Agreste, MD  Lancets 30G MISC Check sugar twice a day before meals 01/02/19  Yes Wendie Agreste, MD  lisinopril (ZESTRIL) 2.5 MG tablet Take 1 tablet (2.5 mg total) by mouth daily. 06/15/19  Yes Wendie Agreste, MD  metFORMIN (GLUCOPHAGE) 850 MG tablet Take 1 tablet (850 mg total) by mouth 2 (two) times daily with a meal. 06/15/19  Yes Wendie Agreste, MD  methocarbamol (ROBAXIN) 500 MG tablet Take 1-2 tablets (500-1,000 mg total) by mouth 4 (four) times daily. Patient not taking: Reported on 10/12/2019 07/02/19  Wendie Agreste, MD   Social History   Socioeconomic History  . Marital status: Married    Spouse name: Not on file  . Number of children: Not on file  . Years of education: Not on file  . Highest education level: Not on file  Occupational History  . Not on file  Tobacco Use  . Smoking status: Never Smoker  . Smokeless tobacco: Never Used  Substance and Sexual Activity  . Alcohol use: Yes    Comment: occ  . Drug use: No  . Sexual activity: Yes  Other Topics Concern  . Not on file    Social History Narrative  . Not on file   Social Determinants of Health   Financial Resource Strain:   . Difficulty of Paying Living Expenses: Not on file  Food Insecurity:   . Worried About Charity fundraiser in the Last Year: Not on file  . Ran Out of Food in the Last Year: Not on file  Transportation Needs:   . Lack of Transportation (Medical): Not on file  . Lack of Transportation (Non-Medical): Not on file  Physical Activity:   . Days of Exercise per Week: Not on file  . Minutes of Exercise per Session: Not on file  Stress:   . Feeling of Stress : Not on file  Social Connections:   . Frequency of Communication with Friends and Family: Not on file  . Frequency of Social Gatherings with Friends and Family: Not on file  . Attends Religious Services: Not on file  . Active Member of Clubs or Organizations: Not on file  . Attends Archivist Meetings: Not on file  . Marital Status: Not on file  Intimate Partner Violence:   . Fear of Current or Ex-Partner: Not on file  . Emotionally Abused: Not on file  . Physically Abused: Not on file  . Sexually Abused: Not on file    Review of Systems  Constitutional: Negative for fatigue and unexpected weight change.  Eyes: Negative for visual disturbance.  Respiratory: Negative for cough, chest tightness and shortness of breath.   Cardiovascular: Negative for chest pain, palpitations and leg swelling.  Gastrointestinal: Negative for abdominal pain and blood in stool.  Neurological: Negative for dizziness, light-headedness and headaches.     Objective:   Vitals:   10/12/19 1700  BP: 124/78  Pulse: (!) 111  Temp: 97.9 F (36.6 C)  TempSrc: Temporal  SpO2: 97%  Weight: 272 lb (123.4 kg)  Height: _0  (1.905 m)      Physical Exam Vitals reviewed.  Constitutional:      Appearance: He is well-developed.  HENT:     Head: Normocephalic and atraumatic.  Eyes:     Pupils: Pupils are equal, round, and reactive to  light.  Neck:     Vascular: No carotid bruit or JVD.  Cardiovascular:     Rate and Rhythm: Normal rate and regular rhythm.     Heart sounds: Normal heart sounds. No murmur.  Pulmonary:     Effort: Pulmonary effort is normal.     Breath sounds: Normal breath sounds. No rales.  Skin:    General: Skin is warm and dry.  Neurological:     Mental Status: He is alert and oriented to person, place, and time.    Results for orders placed or performed in visit on 10/12/19  POCT glycosylated hemoglobin (Hb A1C)  Result Value Ref Range   Hemoglobin A1C 6.7 (A) 4.0 -  5.6 %   HbA1c POC (<> result, manual entry)     HbA1c, POC (prediabetic range)     HbA1c, POC (controlled diabetic range)      Assessment & Plan:  David Crane is a 54 y.o. male . Type 2 diabetes mellitus with microalbuminuria, without long-term current use of insulin (HCC) - Plan: POCT glycosylated hemoglobin (Hb A1C)  Hyperlipidemia, unspecified hyperlipidemia type   Minimal change in A1c, likely attributed to decreased exercise as above.  Plans for improving activity/exercise, continue to monitor diet, discussed with his diabetic complications would like to get A1c definitely below 6.5, and closer to 6 would be ideal.  We will need to balance risk of hypoglycemia with precautions given.  No med changes for now, recheck 3 months.  Okay to refill meds when due.  Not ordered today as may change pharmacy or payment method.  No orders of the defined types were placed in this encounter.  Patient Instructions    We can refill meds when needed- let me know when those refills are needed if running low before next visit.  Watch diet, increase walking/exercise for improved diabetes control.    If you have lab work done today you will be contacted with your lab results within the next 2 weeks.  If you have not heard from [place] then please contact us. The fastest way to get your results is to register for My Chart.   IF you  received an x-ray today, you will receive an invoice from [facility]. Please contact [facility] at [phone] with questions or concerns regarding your invoice.   IF you received labwork today, you will receive an invoice from [place]. Please contact [facility] at [phone] with questions or concerns regarding your invoice.   Our billing staff will not be able to assist you with questions regarding bills from these companies.  You will be contacted with the lab results as soon as they are available. The fastest way to get your results is to activate your My Chart account. Instructions are located on the last page of this paperwork. If you have not heard from [place] regarding the results in 2 weeks, please contact this office.         Signed, [name], MD [facility]

## 2019-10-12 NOTE — Patient Instructions (Addendum)
  We can refill meds when needed- let me know when those refills are needed if running low before next visit.  Watch diet, increase walking/exercise for improved diabetes control.    If you have lab work done today you will be contacted with your lab results within the next 2 weeks.  If you have not heard from [place] then please contact us. The fastest way to get your results is to register for My Chart.   IF you received an x-ray today, you will receive an invoice from [facility]. Please contact [facility] at [phone] with questions or concerns regarding your invoice.   IF you received labwork today, you will receive an invoice from [place]. Please contact [facility] at [phone] with questions or concerns regarding your invoice.   Our billing staff will not be able to assist you with questions regarding bills from these companies.  You will be contacted with the lab results as soon as they are available. The fastest way to get your results is to activate your My Chart account. Instructions are located on the last page of this paperwork. If you have not heard from [place] regarding the results in 2 weeks, please contact this office.

## 2019-12-04 ENCOUNTER — Encounter: Payer: Self-pay | Admitting: Family Medicine

## 2019-12-04 ENCOUNTER — Other Ambulatory Visit: Payer: Self-pay | Admitting: Family Medicine

## 2019-12-04 DIAGNOSIS — E1129 Type 2 diabetes mellitus with other diabetic kidney complication: Secondary | ICD-10-CM

## 2019-12-04 DIAGNOSIS — R809 Proteinuria, unspecified: Secondary | ICD-10-CM

## 2019-12-15 ENCOUNTER — Other Ambulatory Visit: Payer: Self-pay | Admitting: Family Medicine

## 2019-12-15 DIAGNOSIS — G629 Polyneuropathy, unspecified: Secondary | ICD-10-CM

## 2019-12-15 NOTE — Telephone Encounter (Signed)
Requested Prescriptions  Pending Prescriptions Disp Refills  . gabapentin (NEURONTIN) 300 MG capsule [Pharmacy Med Name: Gabapentin 300 MG Oral Capsule] 360 capsule 0    Sig: TAKE 2 CAPSULES BY MOUTH IN THE MORNING AND 2 CAPSULES AT BEDTIME     Neurology: Anticonvulsants - gabapentin Passed - 12/15/2019  2:42 PM      Passed - Valid encounter within last 12 months    Recent Outpatient Visits          2 months ago Type 2 diabetes mellitus with microalbuminuria, without long-term current use of insulin ([place])   [facility], Ranell Patrick, MD   5 months ago Low back pain without sciatica, unspecified back pain laterality, unspecified chronicity   [facility], Ranell Patrick, MD   6 months ago Type 2 diabetes mellitus with microalbuminuria, without long-term current use of insulin [facility])   [facility], Ranell Patrick, MD   9 months ago Type 2 diabetes mellitus with microalbuminuria, without long-term current use of insulin [facility])   [facility], Ranell Patrick, MD   9 months ago Type 2 diabetes mellitus with microalbuminuria, without long-term current use of insulin [facility])   [facility], Ranell Patrick, MD      Future Appointments            In 1 month Carlota Raspberry Ranell Patrick, MD [facility] at [place], [facility]

## 2020-01-05 ENCOUNTER — Encounter: Payer: Self-pay | Admitting: Family Medicine

## 2020-01-20 ENCOUNTER — Ambulatory Visit (INDEPENDENT_AMBULATORY_CARE_PROVIDER_SITE_OTHER): Payer: 59 | Admitting: Family Medicine

## 2020-01-20 ENCOUNTER — Other Ambulatory Visit: Payer: Self-pay

## 2020-01-20 ENCOUNTER — Encounter: Payer: Self-pay | Admitting: Family Medicine

## 2020-01-20 VITALS — BP 121/86 | HR 110 | Temp 98.0°F | Ht 75.0 in | Wt 270.0 lb

## 2020-01-20 DIAGNOSIS — E1129 Type 2 diabetes mellitus with other diabetic kidney complication: Secondary | ICD-10-CM | POA: Diagnosis not present

## 2020-01-20 DIAGNOSIS — G629 Polyneuropathy, unspecified: Secondary | ICD-10-CM | POA: Diagnosis not present

## 2020-01-20 DIAGNOSIS — Z1329 Encounter for screening for other suspected endocrine disorder: Secondary | ICD-10-CM

## 2020-01-20 DIAGNOSIS — E785 Hyperlipidemia, unspecified: Secondary | ICD-10-CM | POA: Diagnosis not present

## 2020-01-20 DIAGNOSIS — R000 Tachycardia, unspecified: Secondary | ICD-10-CM

## 2020-01-20 DIAGNOSIS — R809 Proteinuria, unspecified: Secondary | ICD-10-CM

## 2020-01-20 MED ORDER — ATORVASTATIN CALCIUM 10 MG PO TABS: 10.0000 mg | ORAL_TABLET | Freq: Every day | ORAL | 1 refills | Status: DC

## 2020-01-20 MED ORDER — GLIPIZIDE 5 MG PO TABS: ORAL_TABLET | ORAL | 1 refills | Status: DC

## 2020-01-20 MED ORDER — GABAPENTIN 300 MG PO CAPS: ORAL_CAPSULE | ORAL | 0 refills | Status: DC

## 2020-01-20 MED ORDER — METFORMIN HCL 850 MG PO TABS: 850.0000 mg | ORAL_TABLET | Freq: Two times a day (BID) | ORAL | 1 refills | Status: DC

## 2020-01-20 NOTE — Progress Notes (Signed)
Subjective:  Patient ID: David Crane, male    DOB: October 06, 1965  Age: 54 y.o. MRN: [id number]  CC:  Chief Complaint  Patient presents with  . Follow-up    on type 2 diabetes and hyperlipidemia. pt reports no issues with these conditions since last visit. pt checks his BS once a week. 36m/dl fasting was todays reading. pt reports in the afternoons he rangings in the 150's. medications seems to work well for pt with no known side effects.    HPI David SASSANOpresents for   Diabetes: Complicated by peripheral neuropathy, microalbuminuria, retinopathy. Followed by retina specialist. Gabapentin for peripheral neuropathy, Metformin 850 mg twice daily, glipizide 5 mg twice daily for diabetes.  He is on ACE inhibitor as well as statin.  Increased exercise discussed last visit. Fasting readings: 150-160. Low 92 - was 4 hrs after meal. No symptomatic lows.  Walking for exercise, trying to be more active.  Gabapentin 1 in am, 2 at night working well, rare midday dose.  Wt Readings from Last 3 Encounters:  01/20/20 270 lb (122.5 kg)  10/12/19 272 lb (123.4 kg)  06/15/19 272 lb 9.6 oz (123.7 kg)   Microalbumin: Ratio of 83 in September 2020 Optho, foot exam, pneumovax: Due for Pneumovax, declined previously - refuses again today.  Has had both covid vaccines.   Lab Results  Component Value Date   HGBA1C 6.7 (A) 10/12/2019   HGBA1C 6.4 (H) 06/15/2019   HGBA1C 6.7 (H) 03/11/2019   Lab Results  Component Value Date   LDLCALC 62 06/15/2019   CREATININE 0.99 06/15/2019   Hyperlipidemia: lipitor 177mqd. No new side effects/myalgias.  Lab Results  Component Value Date   CHOL 120 06/15/2019   HDL 38 (L) 06/15/2019   LDLCALC 62 06/15/2019   TRIG 111 06/15/2019   CHOLHDL 3.2 06/15/2019   Lab Results  Component Value Date   ALT 16 06/15/2019   AST 19 06/15/2019   ALKPHOS 49 06/15/2019   BILITOT 0.8 06/15/2019      History Patient Active Problem List   Diagnosis Date  Noted  . Diabetic nephropathy ([facility]/15/2018  . Diabetes ([facility]08/15/2018  . Peripheral neuropathy 05/01/2017   Past Medical History:  Diagnosis Date  . Diabetes mellitus without complication ([facility]  . GERD (gastroesophageal reflux disease)    Past Surgical History:  Procedure Laterality Date  . Growth removed Left   . TONSILLECTOMY    . WISDOM TOOTH EXTRACTION     No Known Allergies Prior to Admission medications   Medication Sig Start Date End Date Taking? Authorizing Provider  atorvastatin (LIPITOR) 10 MG tablet Take 1 tablet (10 mg total) by mouth daily. 06/15/19  Yes GrWendie AgresteMD  blood glucose meter kit and supplies Dispense based on patient and insurance preference. Check twice per day. FOR ICD-9 250.00, 250.01). 04/29/17  Yes GrWendie AgresteMD  gabapentin (NEURONTIN) 300 MG capsule TAKE 2 CAPSULES BY MOUTH IN THE MORNING AND 2 CAPSULES AT BEDTIME 12/15/19  Yes GrWendie AgresteMD  glipiZIDE (GLUCOTROL) 5 MG tablet TAKE 1 TABLET BY MOUTH TWICE DAILY BEFORE A MEAL 06/15/19  Yes GrWendie AgresteMD  glucose blood test strip Use as instructed 01/02/19  Yes GrWendie AgresteMD  Lancets 30G MISC Check sugar twice a day before meals 01/02/19  Yes GrWendie AgresteMD  lisinopril (ZESTRIL) 2.5 MG tablet Take 1 tablet by mouth once daily 12/04/19  Yes GrWendie AgresteMD  metFORMIN (GLUCOPHAGE) 850 MG tablet Take 1 tablet (850 mg total) by mouth 2 (two) times daily with a meal. 06/15/19  Yes Wendie Agreste, MD   Social History   Socioeconomic History  . Marital status: Married    Spouse name: Not on file  . Number of children: Not on file  . Years of education: Not on file  . Highest education level: Not on file  Occupational History  . Not on file  Tobacco Use  . Smoking status: Never Smoker  . Smokeless tobacco: Never Used  Substance and Sexual Activity  . Alcohol use: Yes    Comment: occ  . Drug use: No  . Sexual activity: Yes  Other Topics Concern    . Not on file  Social History Narrative  . Not on file   Social Determinants of Health   Financial Resource Strain:   . Difficulty of Paying Living Expenses:   Food Insecurity:   . Worried About Charity fundraiser in the Last Year:   . Arboriculturist in the Last Year:   Transportation Needs:   . Film/video editor (Medical):   Marland Kitchen Lack of Transportation (Non-Medical):   Physical Activity:   . Days of Exercise per Week:   . Minutes of Exercise per Session:   Stress:   . Feeling of Stress :   Social Connections:   . Frequency of Communication with Friends and Family:   . Frequency of Social Gatherings with Friends and Family:   . Attends Religious Services:   . Active Member of Clubs or Organizations:   . Attends Archivist Meetings:   Marland Kitchen Marital Status:   Intimate Partner Violence:   . Fear of Current or Ex-Partner:   . Emotionally Abused:   Marland Kitchen Physically Abused:   . Sexually Abused:     Review of Systems  Constitutional: Negative for fatigue and unexpected weight change.  Eyes: Negative for visual disturbance.  Respiratory: Negative for cough, chest tightness and shortness of breath.   Cardiovascular: Negative for chest pain, palpitations and leg swelling.  Gastrointestinal: Negative for abdominal pain and blood in stool.  Neurological: Negative for dizziness, light-headedness and headaches.     Objective:   Vitals:   01/20/20 0809  BP: 121/86  Pulse: (!) 110  Temp: 98 F (36.7 C)  TempSrc: Temporal  SpO2: 98%  Weight: 270 lb (122.5 kg)  Height: '6\' 3"'  (1.905 m)     Physical Exam Vitals reviewed.  Constitutional:      Appearance: He is well-developed.  HENT:     Head: Normocephalic and atraumatic.  Eyes:     Pupils: Pupils are equal, round, and reactive to light.  Neck:     Vascular: No carotid bruit or JVD.  Cardiovascular:     Rate and Rhythm: Normal rate and regular rhythm.     Heart sounds: Normal heart sounds. No murmur.   Pulmonary:     Effort: Pulmonary effort is normal.     Breath sounds: Normal breath sounds. No rales.  Skin:    General: Skin is warm and dry.  Neurological:     Mental Status: He is alert and oriented to person, place, and time.  Psychiatric:        Mood and Affect: Mood normal.        Behavior: Behavior normal.        Assessment & Plan:  David Crane is a 54 y.o. male . Type 2  diabetes mellitus with microalbuminuria, without long-term current use of insulin (HCC) - Plan: Hemoglobin A1c, atorvastatin (LIPITOR) 10 MG tablet, glipiZIDE (GLUCOTROL) 5 MG tablet, metFORMIN (GLUCOPHAGE) 850 MG tablet  -Commended on exercise, 3 pound weight loss.  Continue same regimen, labs pending.  Hyperlipidemia, unspecified hyperlipidemia type - Plan: Comprehensive metabolic panel, Lipid panel, atorvastatin (LIPITOR) 10 MG tablet  -Tolerating statin, continue same.  Labs pending  Peripheral polyneuropathy - Plan: gabapentin (NEURONTIN) 300 MG capsule  -Stable with 1 pill in the morning, 2 at night, rare midday dose.  Refilled.  Screening for thyroid disorder - Plan: TSH Tachycardia - Plan: TSH  -Denies palpitations.  Check TSH, RTC precautions if palpitations/new symptoms.   No orders of the defined types were placed in this encounter.  Patient Instructions       If you have lab work done today you will be contacted with your lab results within the next 2 weeks.  If you have not heard from [place] then please contact us. The fastest way to get your results is to register for My Chart.   IF you received an x-ray today, you will receive an invoice from [facility]. Please contact [facility] at [phone] with questions or concerns regarding your invoice.   IF you received labwork today, you will receive an invoice from [place]. Please contact [facility] at [phone] with questions or concerns regarding your invoice.   Our billing staff will not be able to assist you  with questions regarding bills from these companies.  You will be contacted with the lab results as soon as they are available. The fastest way to get your results is to activate your My Chart account. Instructions are located on the last page of this paperwork. If you have not heard from [place] regarding the results in 2 weeks, please contact this office.         Signed, [name], MD [facility]

## 2020-01-20 NOTE — Patient Instructions (Addendum)
  No medication changes at this time.  Keep up the good work with walking and exercise, continue to watch diet, recheck in 3 months.  I will check some blood work today including a thyroid test as your heart rate was slightly elevated.  If you notice any heart palpitations or racing of the heart, follow-up sooner.  Return to the clinic or go to the nearest emergency room if any of your symptoms worsen or new symptoms occur.    If you have lab work done today you will be contacted with your lab results within the next 2 weeks.  If you have not heard from [place] then please contact us. The fastest way to get your results is to register for My Chart.   IF you received an x-ray today, you will receive an invoice from [facility]. Please contact [facility]iology at [phone] with questions or concerns regarding your invoice.   IF you received labwork today, you will receive an invoice from [place]. Please contact [facility] at [phone] with questions or concerns regarding your invoice.   Our billing staff will not be able to assist you with questions regarding bills from these companies.  You will be contacted with the lab results as soon as they are available. The fastest way to get your results is to activate your My Chart account. Instructions are located on the last page of this paperwork. If you have not heard from [place] regarding the results in 2 weeks, please contact this office.

## 2020-01-21 LAB — COMPREHENSIVE METABOLIC PANEL
ALT: 15 IU/L (ref 0–44)
AST: 17 IU/L (ref 0–40)
Albumin/Globulin Ratio: 2.2 (ref 1.2–2.2)
Albumin: 4.7 g/dL (ref 3.8–4.9)
Alkaline Phosphatase: 46 IU/L (ref 39–117)
BUN/Creatinine Ratio: 19 (ref 9–20)
BUN: 17 mg/dL (ref 6–24)
Bilirubin Total: 0.7 mg/dL (ref 0.0–1.2)
CO2: 23 mmol/L (ref 20–29)
Calcium: 9.9 mg/dL (ref 8.7–10.2)
Chloride: 100 mmol/L (ref 96–106)
Creatinine, Ser: 0.91 mg/dL (ref 0.76–1.27)
GFR calc Af Amer: 111 mL/min/{1.73_m2} (ref >59)
GFR calc non Af Amer: 96 mL/min/{1.73_m2} (ref >59)
Globulin, Total: 2.1 g/dL (ref 1.5–4.5)
Glucose: 178 mg/dL — ABNORMAL HIGH (ref 65–99)
Potassium: 5.2 mmol/L (ref 3.5–5.2)
Sodium: 139 mmol/L (ref 134–144)
Total Protein: 6.8 g/dL (ref 6.0–8.5)

## 2020-01-21 LAB — TSH: TSH: 1.27 u[IU]/mL (ref 0.450–4.500)

## 2020-01-21 LAB — HEMOGLOBIN A1C
Est. average glucose Bld gHb Est-mCnc: 160 mg/dL
Hgb A1c MFr Bld: 7.2 % — ABNORMAL HIGH (ref 4.8–5.6)

## 2020-01-21 LAB — LIPID PANEL
Chol/HDL Ratio: 2.6 ratio (ref 0.0–5.0)
Cholesterol, Total: 101 mg/dL (ref 100–199)
HDL: 39 mg/dL — ABNORMAL LOW (ref >39)
LDL Chol Calc (NIH): 44 mg/dL (ref 0–99)
Triglycerides: 96 mg/dL (ref 0–149)
VLDL Cholesterol Cal: 18 mg/dL (ref 5–40)

## 2020-03-05 ENCOUNTER — Other Ambulatory Visit: Payer: Self-pay | Admitting: Family Medicine

## 2020-03-05 DIAGNOSIS — E1129 Type 2 diabetes mellitus with other diabetic kidney complication: Secondary | ICD-10-CM

## 2020-03-05 DIAGNOSIS — R809 Proteinuria, unspecified: Secondary | ICD-10-CM

## 2020-03-05 NOTE — Telephone Encounter (Signed)
Requested Prescriptions  Pending Prescriptions Disp Refills   lisinopril (ZESTRIL) 2.5 MG tablet [Pharmacy Med Name: Lisinopril 2.5 MG Oral Tablet] 90 tablet 1    Sig: Take 1 tablet by mouth once daily     Cardiovascular:  ACE Inhibitors Passed - 03/05/2020 10:22 AM      Passed - Cr in normal range and within 180 days    Creatinine, Ser  Date Value Ref Range Status  01/20/2020 0.91 0.76 - 1.27 mg/dL Final         Passed - K in normal range and within 180 days    Potassium  Date Value Ref Range Status  01/20/2020 5.2 3.5 - 5.2 mmol/L Final         Passed - Patient is not pregnant      Passed - Last BP in normal range    BP Readings from Last 1 Encounters:  01/20/20 121/86         Passed - Valid encounter within last 6 months    Recent Outpatient Visits          1 month ago Type 2 diabetes mellitus with microalbuminuria, without long-term current use of insulin ([place])   [facility], Ranell Patrick, MD   4 months ago Type 2 diabetes mellitus with microalbuminuria, without long-term current use of insulin [facility])   [facility], Ranell Patrick, MD   8 months ago Low back pain without sciatica, unspecified back pain laterality, unspecified chronicity   [facility], Ranell Patrick, MD   8 months ago Type 2 diabetes mellitus with microalbuminuria, without long-term current use of insulin [facility])   [facility], Ranell Patrick, MD   12 months ago Type 2 diabetes mellitus with microalbuminuria, without long-term current use of insulin [facility])   [facility], Ranell Patrick, MD      Future Appointments            In 1 month Carlota Raspberry Ranell Patrick, MD Primary Care at [place], [facility]

## 2020-04-14 ENCOUNTER — Other Ambulatory Visit: Payer: Self-pay

## 2020-04-14 ENCOUNTER — Ambulatory Visit (INDEPENDENT_AMBULATORY_CARE_PROVIDER_SITE_OTHER): Payer: 59 | Admitting: Family Medicine

## 2020-04-14 VITALS — BP 129/86 | HR 106 | Temp 97.6°F | Ht 75.0 in | Wt 270.2 lb

## 2020-04-14 DIAGNOSIS — R000 Tachycardia, unspecified: Secondary | ICD-10-CM | POA: Diagnosis not present

## 2020-04-14 DIAGNOSIS — R9431 Abnormal electrocardiogram [ECG] [EKG]: Secondary | ICD-10-CM

## 2020-04-14 DIAGNOSIS — R0789 Other chest pain: Secondary | ICD-10-CM

## 2020-04-14 DIAGNOSIS — R5383 Other fatigue: Secondary | ICD-10-CM | POA: Diagnosis not present

## 2020-04-14 DIAGNOSIS — R42 Dizziness and giddiness: Secondary | ICD-10-CM

## 2020-04-14 NOTE — Patient Instructions (Addendum)
EKG with a few possible abnormal areas but overall looked okay.  I would like a cardiologist to review this as well as your symptoms and I will refer you.  You should expect a call within the next 2 weeks.  I will check some blood work to see if that may be a cause of the fatigue.  I think it is reasonable to stop lisinopril for now, make sure to drink plenty of fluids, and recheck in the next 2 weeks.  If any worsening chest pain or worsening symptoms be seen in the emergency room.  We can certainly discuss nutritionist further at follow-up in 2 weeks.  Return to the clinic or go to the nearest emergency room if any of your symptoms worsen or new symptoms occur.   Nonspecific Chest Pain, Adult Chest pain can be caused by many different conditions. It can be caused by a condition that is life-threatening and requires treatment right away. It can also be caused by something that is not life-threatening. If you have chest pain, it can be hard to know the difference, so it is important to get help right away to make sure that you do not have a serious condition. Some life-threatening causes of chest pain include:  Heart attack.  A tear in the body's main blood vessel (aortic dissection).  Inflammation around your heart (pericarditis).  A problem in the lungs, such as a blood clot (pulmonary embolism) or a collapsed lung (pneumothorax). Some non life-threatening causes of chest pain include:  Heartburn.  Anxiety or stress.  Damage to the bones, muscles, and cartilage that make up your chest wall.  Pneumonia or bronchitis.  Shingles infection (varicella-zoster virus). Chest pain can feel like:  Pain or discomfort on the surface of your chest or deep in your chest.  Crushing, pressure, aching, or squeezing pain.  Burning or tingling.  Dull or sharp pain that is worse when you move, cough, or take a deep breath.  Pain or discomfort that is also felt in your back, neck, jaw, shoulder,  or arm, or pain that spreads to any of these areas. Your chest pain may come and go. It may also be constant. Your health care provider will do lab tests and other studies to find the cause of your pain. Treatment will depend on the cause of your chest pain. Follow these instructions at home: Medicines  Take over-the-counter and prescription medicines only as told by your health care provider.  If you were prescribed an antibiotic, take it as told by your health care provider. Do not stop taking the antibiotic even if you start to feel better. Lifestyle   Rest as directed by your health care provider.  Do not use any products that contain nicotine or tobacco, such as cigarettes and e-cigarettes. If you need help quitting, ask your health care provider.  Do not drink alcohol.  Make healthy lifestyle choices as recommended. These may include: ? Getting regular exercise. Ask your health care provider to suggest some activities that are safe for you. ? Eating a heart-healthy diet. This includes plenty of fresh fruits and vegetables, whole grains, low-fat (lean) protein, and low-fat dairy products. A dietitian can help you find healthy eating options. ? Maintaining a healthy weight. ? Managing any other health conditions you have, such as high blood pressure (hypertension) or diabetes. ? Reducing stress, such as with yoga or relaxation techniques. General instructions  Pay attention to any changes in your symptoms. Tell your health care provider  about them or any new symptoms.  Avoid any activities that cause chest pain.  Keep all follow-up visits as told by your health care provider. This is important. This includes visits for any further testing if your chest pain does not go away. Contact a health care provider if:  Your chest pain does not go away.  You feel depressed.  You have a fever. Get help right away if:  Your chest pain gets worse.  You have a cough that gets worse, or  you cough up blood.  You have severe pain in your abdomen.  You faint.  You have sudden, unexplained chest discomfort.  You have sudden, unexplained discomfort in your arms, back, neck, or jaw.  You have shortness of breath at any time.  You suddenly start to sweat, or your skin gets clammy.  You feel nausea or you vomit.  You suddenly feel lightheaded or dizzy.  You have severe weakness, or unexplained weakness or fatigue.  Your heart begins to beat quickly, or it feels like it is skipping beats. These symptoms may represent a serious problem that is an emergency. Do not wait to see if the symptoms will go away. Get medical help right away. Call your local emergency services (911 in the [place] Do not drive yourself to the hospital. Summary  Chest pain can be caused by a condition that is serious and requires urgent treatment. It may also be caused by something that is not life-threatening.  If you have chest pain, it is very important to see your health care provider. Your health care provider may do lab tests and other studies to find the cause of your pain.  Follow your health care provider's instructions on taking medicines, making lifestyle changes, and getting emergency treatment if symptoms become worse.  Keep all follow-up visits as told by your health care provider. This includes visits for any further testing if your chest pain does not go away. This information is not intended to replace advice given to you by your health care provider. Make sure you discuss any questions you have with your health care provider. Document Revised: 03/06/2018 Document Reviewed: 03/06/2018 Elsevier Patient Education  [place].   Fatigue If you have fatigue, you feel tired all the time and have a lack of energy or a lack of motivation. Fatigue may make it difficult to start or complete tasks because of exhaustion. In general, occasional or mild fatigue is often a normal response  to activity or life. However, long-lasting (chronic) or extreme fatigue may be a symptom of a medical condition. Follow these instructions at home: General instructions  Watch your fatigue for any changes.  Go to bed and get up at the same time every day.  Avoid fatigue by pacing yourself during the day and getting enough sleep at night.  Maintain a healthy weight. Medicines  Take over-the-counter and prescription medicines only as told by your health care provider.  Take a multivitamin, if told by your health care provider.  Do not use herbal or dietary supplements unless they are approved by your health care provider. Activity   Exercise regularly, as told by your health care provider.  Use or practice techniques to help you relax, such as yoga, tai chi, meditation, or massage therapy. Eating and drinking   Avoid heavy meals in the evening.  Eat a well-balanced diet, which includes lean proteins, whole grains, plenty of fruits and vegetables, and low-fat dairy products.  Avoid consuming too much  caffeine.  Avoid the use of alcohol.  Drink enough fluid to keep your urine pale yellow. Lifestyle  Change situations that cause you stress. Try to keep your work and personal schedule in balance.  Do not use any products that contain nicotine or tobacco, such as cigarettes and e-cigarettes. If you need help quitting, ask your health care provider.  Do not use drugs. Contact a health care provider if:  Your fatigue does not get better.  You have a fever.  You suddenly lose or gain weight.  You have headaches.  You have trouble falling asleep or sleeping through the night.  You feel angry, guilty, anxious, or sad.  You are unable to have a bowel movement (constipation).  Your skin is dry.  You have swelling in your legs or another part of your body. Get help right away if:  You feel confused.  Your vision is blurry.  You feel faint or you pass out.  You  have a severe headache.  You have severe pain in your abdomen, your back, or the area between your waist and hips (pelvis).  You have chest pain, shortness of breath, or an irregular or fast heartbeat.  You are unable to urinate, or you urinate less than normal.  You have abnormal bleeding, such as bleeding from the rectum, vagina, nose, lungs, or nipples.  You vomit blood.  You have thoughts about hurting yourself or others. If you ever feel like you may hurt yourself or others, or have thoughts about taking your own life, get help right away. You can go to your nearest emergency department or call:  Your local emergency services (911 in the [place]  A suicide crisis helpline, such as the [facility] at (657)774-4851. This is open 24 hours a day. Summary  If you have fatigue, you feel tired all the time and have a lack of energy or a lack of motivation.  Fatigue may make it difficult to start or complete tasks because of exhaustion.  Long-lasting (chronic) or extreme fatigue may be a symptom of a medical condition.  Exercise regularly, as told by your health care provider.  Change situations that cause you stress. Try to keep your work and personal schedule in balance. This information is not intended to replace advice given to you by your health care provider. Make sure you discuss any questions you have with your health care provider. Document Revised: 03/25/2019 Document Reviewed: 05/29/2017 Elsevier Patient Education  [employer].    If you have lab work done today you will be contacted with your lab results within the next 2 weeks.  If you have not heard from [place] then please contact us. The fastest way to get your results is to register for My Chart.   IF you received an x-ray today, you will receive an invoice from [facility]. Please contact [facility] at [phone] with questions or concerns regarding your invoice.    IF you received labwork today, you will receive an invoice from [facility]. Please contact [facility] at [phone] with questions or concerns regarding your invoice.   Our billing staff will not be able to assist you with questions regarding bills from these companies.  You will be contacted with the lab results as soon as they are available. The fastest way to get your results is to activate your My Chart account. Instructions are located on the last page of this paperwork. If you have not heard from [place] regarding the results  in 2 weeks, please contact this office.

## 2020-04-14 NOTE — Progress Notes (Signed)
Subjective:  Patient ID: David Crane, male    DOB: [birth year]  Age: 55 y.o. MRN: [id number]  CC:  Chief Complaint  Patient presents with  . Fatigue    Pt stated that he has been experiencing no energy,dizzness in the morning to where he has to sit on the edge of the bed for about 5 sec, and some nausesness. he is currently everyday 1 mile     HPI David Crane presents for   Fatigue: Infrequent in past with slight symptoms of fatigue a few times per day, but worse past few weeks - feels worse during that time.  Had increased from 1/2 to 1 mile per day walking since May.  Last 3-4 weeks walking program inside for a mile due to hot weather.  Feels ok with walking.  After exercise feels a little lightheaded.  Has to sit on side of bed in am for 5 seconds, due to dizziness/lightheadedness. Few seconds of dizziness if looking down and sitting up quickly.  No syncope.  No falls.  faster heartbeat at times, but no new palpitations.  Occasional left chest ache at times lasting a few seconds, going on for years. Noticing more frequent past month. Notices with movement at times. Not with walking or exercise.  Younger brother was told he had early CHF. No MI. No PTCA. No other FH of heart disease.  Slight ringing in ears, not new.  History of diabetes, most recent visit May 5,  Blood sugar at times of fatigue - 122. No symptomatic lows.  Past few weeks feeling more lightheaded, more fatigue.  No melena/hematochezia.  Considering trip to [place] end of next month. Has been watching carbs past few months. No change in calories.  Drinking water - 4 of the 16 oz bottle per day.  2 glasses diet Mtn Dew per day, no other caffeine.  1 gabapentin in am, 2 at night. No midday dosing recently.  Wt Readings from Last 3 Encounters:  04/14/20 (!) 270 lb 3.2 oz (122.6 kg)  01/20/20 270 lb (122.5 kg)  10/12/19 272 lb (123.4 kg)    BP Readings from Last 3 Encounters:  04/14/20 (!) 129/86    01/20/20 121/86  10/12/19 124/78   Lab Results  Component Value Date   HGBA1C 7.2 (H) 01/20/2020   No data found. History Patient Active Problem List   Diagnosis Date Noted  . Diabetic nephropathy ([place]) 05/01/2017  . Diabetes ([place]) 05/01/2017  . Peripheral neuropathy 05/01/2017   Past Medical History:  Diagnosis Date  . Diabetes mellitus without complication ([place])   . GERD (gastroesophageal reflux disease)    Past Surgical History:  Procedure Laterality Date  . Growth removed Left   . TONSILLECTOMY    . WISDOM TOOTH EXTRACTION     No Known Allergies Prior to Admission medications   Medication Sig Start Date End Date Taking? Authorizing Provider  atorvastatin (LIPITOR) 10 MG tablet Take 1 tablet (10 mg total) by mouth daily. 01/20/20   Wendie Agreste, MD  blood glucose meter kit and supplies Dispense based on patient and insurance preference. Check twice per day. FOR ICD-9 250.00, 250.01). 04/29/17   Wendie Agreste, MD  gabapentin (NEURONTIN) 300 MG capsule TAKE 1-2 CAPSULES BY MOUTH IN THE MORNING AND 2 CAPSULES AT BEDTIME 01/20/20   Wendie Agreste, MD  glipiZIDE (GLUCOTROL) 5 MG tablet TAKE 1 TABLET BY MOUTH TWICE DAILY BEFORE A MEAL 01/20/20   Wendie Agreste, MD  glucose  blood test strip Use as instructed 01/02/19   Wendie Agreste, MD  Lancets 30G MISC Check sugar twice a day before meals 01/02/19   Wendie Agreste, MD  lisinopril (ZESTRIL) 2.5 MG tablet Take 1 tablet by mouth once daily 03/05/20   Wendie Agreste, MD  metFORMIN (GLUCOPHAGE) 850 MG tablet Take 1 tablet (850 mg total) by mouth 2 (two) times daily with a meal. 01/20/20   Wendie Agreste, MD   Social History   Socioeconomic History  . Marital status: Married    Spouse name: Not on file  . Number of children: Not on file  . Years of education: Not on file  . Highest education level: Not on file  Occupational History  . Not on file  Tobacco Use  . Smoking status: Never Smoker  . Smokeless  tobacco: Never Used  Vaping Use  . Vaping Use: Never used  Substance and Sexual Activity  . Alcohol use: Yes    Comment: occ  . Drug use: No  . Sexual activity: Yes  Other Topics Concern  . Not on file  Social History Narrative  . Not on file   Social Determinants of Health   Financial Resource Strain:   . Difficulty of Paying Living Expenses:   Food Insecurity:   . Worried About Charity fundraiser in the Last Year:   . Arboriculturist in the Last Year:   Transportation Needs:   . Film/video editor (Medical):   Marland Kitchen Lack of Transportation (Non-Medical):   Physical Activity:   . Days of Exercise per Week:   . Minutes of Exercise per Session:   Stress:   . Feeling of Stress :   Social Connections:   . Frequency of Communication with Friends and Family:   . Frequency of Social Gatherings with Friends and Family:   . Attends Religious Services:   . Active Member of Clubs or Organizations:   . Attends Archivist Meetings:   Marland Kitchen Marital Status:   Intimate Partner Violence:   . Fear of Current or Ex-Partner:   . Emotionally Abused:   Marland Kitchen Physically Abused:   . Sexually Abused:     Review of Systems Per HPI.   Objective:   Vitals:   04/14/20 1442  BP: (!) 129/86  Pulse: (!) 106  Temp: 97.6 F (36.4 C)  TempSrc: Temporal  SpO2: 99%  Weight: (!) 270 lb 3.2 oz (122.6 kg)  Height: _0  (1.905 m)     Physical Exam Vitals reviewed.  Constitutional:      Appearance: He is well-developed.  HENT:     Head: Normocephalic and atraumatic.     Right Ear: There is no impacted cerumen (Moderate cerumen left greater than right, not obstructed.).     Left Ear: There is no impacted cerumen.  Eyes:     Extraocular Movements:     Right eye: No nystagmus.     Left eye: No nystagmus.     Pupils: Pupils are equal, round, and reactive to light.  Neck:     Vascular: No carotid bruit or JVD.  Cardiovascular:     Rate and Rhythm: Regular rhythm. Tachycardia present.      Heart sounds: Normal heart sounds. No murmur heard.      Comments: Tachycardic but regular.  Pulmonary:     Effort: Pulmonary effort is normal.     Breath sounds: Normal breath sounds. No rales.  Abdominal:  General: Abdomen is flat.     Tenderness: There is no abdominal tenderness.  Skin:    General: Skin is warm and dry.  Neurological:     General: No focal deficit present.     Mental Status: He is alert and oriented to person, place, and time.     GCS: GCS eye subscore is 4. GCS verbal subscore is 5. GCS motor subscore is 6.     Cranial Nerves: No cranial nerve deficit, dysarthria or facial asymmetry.     Sensory: No sensory deficit.     Motor: No weakness, tremor, abnormal muscle tone or pronator drift.     Coordination: Romberg sign negative. Coordination normal. Finger-Nose-Finger Test normal.     Gait: Gait is intact.     Comments: nonfocal  Psychiatric:        Mood and Affect: Mood normal.        Behavior: Behavior normal.        Thought Content: Thought content normal.     EKG, no prior comparison.  Sinus rhythm rate 99.  Read as nonspecific ST depression, possible old inferior apical infarct, but no acute findings noted.   Assessment & Plan:  David Crane is a 54 y.o. male . Fatigue, unspecified type - Plan: Basic metabolic panel, EKG 34-LPFX, CBC, Ambulatory referral to Cardiology  Episodic lightheadedness - Plan: EKG 12-Lead, CBC, Ambulatory referral to Cardiology  Tachycardia - Plan: TSH, EKG 12-Lead, Ambulatory referral to Cardiology  Chest discomfort - Plan: EKG 12-Lead, Ambulatory referral to Cardiology  Nonspecific abnormal electrocardiogram (ECG) (EKG) - Plan: Ambulatory referral to Cardiology  Nonspecific EKG, no apparent acute findings.  Check CBC, BMP with fatigue, okay to temporarily hold ACE inhibitor, monitor for hypoglycemia, but not thought to be cause.  Refer to cardiology given cardiac risk factors with ER precautions/911 precautions  given.  No orders of the defined types were placed in this encounter.  Patient Instructions   EKG with a few possible abnormal areas but overall looked okay.  I would like a cardiologist to review this as well as your symptoms and I will refer you.  You should expect a call within the next 2 weeks.  I will check some blood work to see if that may be a cause of the fatigue.  I think it is reasonable to stop lisinopril for now, make sure to drink plenty of fluids, and recheck in the next 2 weeks.  If any worsening chest pain or worsening symptoms be seen in the emergency room.  We can certainly discuss nutritionist further at follow-up in 2 weeks.  Return to the clinic or go to the nearest emergency room if any of your symptoms worsen or new symptoms occur.   Nonspecific Chest Pain, Adult Chest pain can be caused by many different conditions. It can be caused by a condition that is life-threatening and requires treatment right away. It can also be caused by something that is not life-threatening. If you have chest pain, it can be hard to know the difference, so it is important to get help right away to make sure that you do not have a serious condition. Some life-threatening causes of chest pain include:  Heart attack.  A tear in the body's main blood vessel (aortic dissection).  Inflammation around your heart (pericarditis).  A problem in the lungs, such as a blood clot (pulmonary embolism) or a collapsed lung (pneumothorax). Some non life-threatening causes of chest pain include:  Heartburn.  Anxiety or  stress.  Damage to the bones, muscles, and cartilage that make up your chest wall.  Pneumonia or bronchitis.  Shingles infection (varicella-zoster virus). Chest pain can feel like:  Pain or discomfort on the surface of your chest or deep in your chest.  Crushing, pressure, aching, or squeezing pain.  Burning or tingling.  Dull or sharp pain that is worse when you move,  cough, or take a deep breath.  Pain or discomfort that is also felt in your back, neck, jaw, shoulder, or arm, or pain that spreads to any of these areas. Your chest pain may come and go. It may also be constant. Your health care provider will do lab tests and other studies to find the cause of your pain. Treatment will depend on the cause of your chest pain. Follow these instructions at home: Medicines  Take over-the-counter and prescription medicines only as told by your health care provider.  If you were prescribed an antibiotic, take it as told by your health care provider. Do not stop taking the antibiotic even if you start to feel better. Lifestyle   Rest as directed by your health care provider.  Do not use any products that contain nicotine or tobacco, such as cigarettes and e-cigarettes. If you need help quitting, ask your health care provider.  Do not drink alcohol.  Make healthy lifestyle choices as recommended. These may include: ? Getting regular exercise. Ask your health care provider to suggest some activities that are safe for you. ? Eating a heart-healthy diet. This includes plenty of fresh fruits and vegetables, whole grains, low-fat (lean) protein, and low-fat dairy products. A dietitian can help you find healthy eating options. ? Maintaining a healthy weight. ? Managing any other health conditions you have, such as high blood pressure (hypertension) or diabetes. ? Reducing stress, such as with yoga or relaxation techniques. General instructions  Pay attention to any changes in your symptoms. Tell your health care provider about them or any new symptoms.  Avoid any activities that cause chest pain.  Keep all follow-up visits as told by your health care provider. This is important. This includes visits for any further testing if your chest pain does not go away. Contact a health care provider if:  Your chest pain does not go away.  You feel depressed.  You have  a fever. Get help right away if:  Your chest pain gets worse.  You have a cough that gets worse, or you cough up blood.  You have severe pain in your abdomen.  You faint.  You have sudden, unexplained chest discomfort.  You have sudden, unexplained discomfort in your arms, back, neck, or jaw.  You have shortness of breath at any time.  You suddenly start to sweat, or your skin gets clammy.  You feel nausea or you vomit.  You suddenly feel lightheaded or dizzy.  You have severe weakness, or unexplained weakness or fatigue.  Your heart begins to beat quickly, or it feels like it is skipping beats. These symptoms may represent a serious problem that is an emergency. Do not wait to see if the symptoms will go away. Get medical help right away. Call your local emergency services (911 in the [place] Do not drive yourself to the hospital. Summary  Chest pain can be caused by a condition that is serious and requires urgent treatment. It may also be caused by something that is not life-threatening.  If you have chest pain, it is very important to see  your health care provider. Your health care provider may do lab tests and other studies to find the cause of your pain.  Follow your health care provider's instructions on taking medicines, making lifestyle changes, and getting emergency treatment if symptoms become worse.  Keep all follow-up visits as told by your health care provider. This includes visits for any further testing if your chest pain does not go away. This information is not intended to replace advice given to you by your health care provider. Make sure you discuss any questions you have with your health care provider. Document Revised: 03/06/2018 Document Reviewed: 03/06/2018 Elsevier Patient Education  [employer].   Fatigue If you have fatigue, you feel tired all the time and have a lack of energy or a lack of motivation. Fatigue may make it difficult to start or  complete tasks because of exhaustion. In general, occasional or mild fatigue is often a normal response to activity or life. However, long-lasting (chronic) or extreme fatigue may be a symptom of a medical condition. Follow these instructions at home: General instructions  Watch your fatigue for any changes.  Go to bed and get up at the same time every day.  Avoid fatigue by pacing yourself during the day and getting enough sleep at night.  Maintain a healthy weight. Medicines  Take over-the-counter and prescription medicines only as told by your health care provider.  Take a multivitamin, if told by your health care provider.  Do not use herbal or dietary supplements unless they are approved by your health care provider. Activity   Exercise regularly, as told by your health care provider.  Use or practice techniques to help you relax, such as yoga, tai chi, meditation, or massage therapy. Eating and drinking   Avoid heavy meals in the evening.  Eat a well-balanced diet, which includes lean proteins, whole grains, plenty of fruits and vegetables, and low-fat dairy products.  Avoid consuming too much caffeine.  Avoid the use of alcohol.  Drink enough fluid to keep your urine pale yellow. Lifestyle  Change situations that cause you stress. Try to keep your work and personal schedule in balance.  Do not use any products that contain nicotine or tobacco, such as cigarettes and e-cigarettes. If you need help quitting, ask your health care provider.  Do not use drugs. Contact a health care provider if:  Your fatigue does not get better.  You have a fever.  You suddenly lose or gain weight.  You have headaches.  You have trouble falling asleep or sleeping through the night.  You feel angry, guilty, anxious, or sad.  You are unable to have a bowel movement (constipation).  Your skin is dry.  You have swelling in your legs or another part of your body. Get help  right away if:  You feel confused.  Your vision is blurry.  You feel faint or you pass out.  You have a severe headache.  You have severe pain in your abdomen, your back, or the area between your waist and hips (pelvis).  You have chest pain, shortness of breath, or an irregular or fast heartbeat.  You are unable to urinate, or you urinate less than normal.  You have abnormal bleeding, such as bleeding from the rectum, vagina, nose, lungs, or nipples.  You vomit blood.  You have thoughts about hurting yourself or others. If you ever feel like you may hurt yourself or others, or have thoughts about taking your own life, get  help right away. You can go to your nearest emergency department or call:  Your local emergency services (911 in the [place]  A suicide crisis helpline, such as the [facility] at 415 528 0516. This is open 24 hours a day. Summary  If you have fatigue, you feel tired all the time and have a lack of energy or a lack of motivation.  Fatigue may make it difficult to start or complete tasks because of exhaustion.  Long-lasting (chronic) or extreme fatigue may be a symptom of a medical condition.  Exercise regularly, as told by your health care provider.  Change situations that cause you stress. Try to keep your work and personal schedule in balance. This information is not intended to replace advice given to you by your health care provider. Make sure you discuss any questions you have with your health care provider. Document Revised: 03/25/2019 Document Reviewed: 05/29/2017 Elsevier Patient Education  [employer].    If you have lab work done today you will be contacted with your lab results within the next 2 weeks.  If you have not heard from [place] then please contact us. The fastest way to get your results is to register for My Chart.   IF you received an x-ray today, you will receive an invoice from [facility].  Please contact [facility] at [phone] with questions or concerns regarding your invoice.   IF you received labwork today, you will receive an invoice from [place]. Please contact [facility] at [phone] with questions or concerns regarding your invoice.   Our billing staff will not be able to assist you with questions regarding bills from these companies.  You will be contacted with the lab results as soon as they are available. The fastest way to get your results is to activate your My Chart account. Instructions are located on the last page of this paperwork. If you have not heard from [place] regarding the results in 2 weeks, please contact this office.         Signed, [name], MD [facility]

## 2020-04-15 LAB — CBC
Hematocrit: 42.3 % (ref 37.5–51.0)
Hemoglobin: 14.1 g/dL (ref 13.0–17.7)
MCH: 29.1 pg (ref 26.6–33.0)
MCHC: 33.3 g/dL (ref 31.5–35.7)
MCV: 87 fL (ref 79–97)
Platelets: 305 10*3/uL (ref 150–450)
RBC: 4.84 x10E6/uL (ref 4.14–5.80)
RDW: 12.7 % (ref 11.6–15.4)
WBC: 6.2 10*3/uL (ref 3.4–10.8)

## 2020-04-15 LAB — BASIC METABOLIC PANEL
BUN/Creatinine Ratio: 29 — ABNORMAL HIGH (ref 9–20)
BUN: 27 mg/dL — ABNORMAL HIGH (ref 6–24)
CO2: 24 mmol/L (ref 20–29)
Calcium: 10 mg/dL (ref 8.7–10.2)
Chloride: 96 mmol/L (ref 96–106)
Creatinine, Ser: 0.94 mg/dL (ref 0.76–1.27)
GFR calc Af Amer: 106 mL/min/{1.73_m2} (ref >59)
GFR calc non Af Amer: 92 mL/min/{1.73_m2} (ref >59)
Glucose: 100 mg/dL — ABNORMAL HIGH (ref 65–99)
Potassium: 4.8 mmol/L (ref 3.5–5.2)
Sodium: 136 mmol/L (ref 134–144)

## 2020-04-15 LAB — TSH: TSH: 1.17 u[IU]/mL (ref 0.450–4.500)

## 2020-04-16 ENCOUNTER — Encounter: Payer: Self-pay | Admitting: Family Medicine

## 2020-04-19 ENCOUNTER — Telehealth: Payer: Self-pay | Admitting: Cardiovascular Disease

## 2020-04-19 NOTE — Telephone Encounter (Signed)
David Crane is calling requesting Dr. Audie Box call Dr. Carlota Raspberry to discuss his care prior to his scheduled New Patient appointment to avoid repeatative care/test. He states he is currently looking for employment and is trying to avoid additional appointments if possible incase of new employment by the time of his upcoming appointment. He states he is following up with Dr. Carlota Raspberry on 05/05/20 in regards to his previous appointment that resulted in the referral to heart care. Please advise.

## 2020-04-20 NOTE — Telephone Encounter (Signed)
Dr. Carlota Raspberry is in [place]. No need to call for records.

## 2020-04-21 ENCOUNTER — Ambulatory Visit: Payer: 59 | Admitting: Family Medicine

## 2020-04-21 NOTE — Telephone Encounter (Signed)
Records are available, and any testing completed is in chart.

## 2020-04-28 ENCOUNTER — Ambulatory Visit (INDEPENDENT_AMBULATORY_CARE_PROVIDER_SITE_OTHER): Payer: 59 | Admitting: Family Medicine

## 2020-04-28 ENCOUNTER — Other Ambulatory Visit: Payer: Self-pay

## 2020-04-28 ENCOUNTER — Encounter: Payer: Self-pay | Admitting: Family Medicine

## 2020-04-28 ENCOUNTER — Telehealth: Payer: Self-pay | Admitting: Family Medicine

## 2020-04-28 VITALS — BP 121/83 | HR 117 | Temp 98.0°F | Ht 75.0 in | Wt 270.0 lb

## 2020-04-28 DIAGNOSIS — R5383 Other fatigue: Secondary | ICD-10-CM | POA: Diagnosis not present

## 2020-04-28 DIAGNOSIS — R809 Proteinuria, unspecified: Secondary | ICD-10-CM | POA: Diagnosis not present

## 2020-04-28 DIAGNOSIS — R0789 Other chest pain: Secondary | ICD-10-CM | POA: Diagnosis not present

## 2020-04-28 DIAGNOSIS — E1129 Type 2 diabetes mellitus with other diabetic kidney complication: Secondary | ICD-10-CM | POA: Diagnosis not present

## 2020-04-28 NOTE — Progress Notes (Signed)
Subjective:  Patient ID: David Crane, male    DOB: October 08, 1965  Age: 54 y.o. MRN: [id number]  CC:  Chief Complaint  Patient presents with  . Follow-up    on fatigue and nutrition. PT reports feeling alot better since last OV. PT reports he has an appt with cardiology on 05/31/2020. pt reports he is still having some dizzieness in the morings but it has imporoved.     HPI David Crane presents for   Follow-up of fatigue.  Last office visit July 29.  Nonspecific EKG at that time without apparent acute findings.  CBC, BMP were obtained without concerns, temporarily held ACE inhibitor and advised to monitor for hypoglycemia.  Also recommended increasing fluids.  Still some dizziness in the morning but overall has been feeling better.  No further fatigue.  No symptomatic hypoglycemia.  Minimal dizziness with first getting up out of bed for only 3 seconds. No dizziness with position. Fleeting sensation in upper left chest for few seconds few times per day at rest. No exertional chest pain. Activity without symptoms.  No prior stress testing.  Chronic elevated HR.   Job offer today - will be starting after trip to [place] on 8/27- 9/3.   Diabetes: Complicated by microalbuminuria, hyperglycemia, retinopathy, peripheral neuropathy.   Last A1c 7.2, May 5. Had lost a few pounds.  gapapentin 1 in am, 2 at night. occsional midday dose - working well.  Continued on same regimen last visit. Metformin 831m BID. Glipizide 566mbefore meals BID.  No symptomatic lows.   Plans on more activity with new job next month.   Wt Readings from Last 3 Encounters:  04/28/20 270 lb (122.5 kg)  04/14/20 (!) 270 lb 3.2 oz (122.6 kg)  01/20/20 270 lb (122.5 kg)     Lab Results  Component Value Date   HGBA1C 7.3 (H) 04/28/2020   HGBA1C 7.2 (H) 01/20/2020   HGBA1C 6.7 (A) 10/12/2019   Lab Results  Component Value Date   LDLCALC 44 01/20/2020   CREATININE 0.94 04/14/2020     Immunization  History  Administered Date(s) Administered  . Moderna SARS-COVID-2 Vaccination 12/04/2019, 01/05/2020  . Tdap 02/25/2019     Results for orders placed or performed in visit on 04/28/20  Hemoglobin A1c  Result Value Ref Range   Hgb A1c MFr Bld 7.3 (H) 4.8 - 5.6 %   Est. average glucose Bld gHb Est-mCnc 163 mg/dL    History Patient Active Problem List   Diagnosis Date Noted  . Diabetic nephropathy ([facility]/15/2018  . Diabetes ([facility]/15/2018  . Peripheral neuropathy 05/01/2017   Past Medical History:  Diagnosis Date  . Diabetes mellitus without complication ([facility]  . GERD (gastroesophageal reflux disease)    Past Surgical History:  Procedure Laterality Date  . Growth removed Left   . TONSILLECTOMY    . WISDOM TOOTH EXTRACTION     No Known Allergies Prior to Admission medications   Medication Sig Start Date End Date Taking? Authorizing Provider  atorvastatin (LIPITOR) 10 MG tablet Take 1 tablet (10 mg total) by mouth daily. 01/20/20  Yes David Crane  blood glucose meter kit and supplies Dispense based on patient and insurance preference. Check twice per day. FOR ICD-9 250.00, 250.01). 04/29/17  Yes David Crane  gabapentin (NEURONTIN) 300 MG capsule TAKE 1-2 CAPSULES BY MOUTH IN THE MORNING AND 2 CAPSULES AT BEDTIME 01/20/20  Yes David Crane  glipiZIDE (GLUCOTROL) 5 MG tablet  TAKE 1 TABLET BY MOUTH TWICE DAILY BEFORE A MEAL 01/20/20  Yes David Agreste, MD  glucose blood test strip Use as instructed 01/02/19  Yes David Agreste, MD  Lancets 30G MISC Check sugar twice a day before meals 01/02/19  Yes David Agreste, MD  metFORMIN (GLUCOPHAGE) 850 MG tablet Take 1 tablet (850 mg total) by mouth 2 (two) times daily with a meal. 01/20/20  Yes David Agreste, MD  lisinopril (ZESTRIL) 2.5 MG tablet Take 1 tablet by mouth once daily Patient not taking: Reported on 04/28/2020 03/05/20   David Agreste, MD   Social History   Socioeconomic History  .  Marital status: Married    Spouse name: Not on file  . Number of children: Not on file  . Years of education: Not on file  . Highest education level: Not on file  Occupational History  . Not on file  Tobacco Use  . Smoking status: Never Smoker  . Smokeless tobacco: Never Used  Vaping Use  . Vaping Use: Never used  Substance and Sexual Activity  . Alcohol use: Yes    Comment: occ  . Drug use: No  . Sexual activity: Yes  Other Topics Concern  . Not on file  Social History Narrative  . Not on file   Social Determinants of Health   Financial Resource Strain:   . Difficulty of Paying Living Expenses:   Food Insecurity:   . Worried About Charity fundraiser in the Last Year:   . Arboriculturist in the Last Year:   Transportation Needs:   . Film/video editor (Medical):   Marland Kitchen Lack of Transportation (Non-Medical):   Physical Activity:   . Days of Exercise per Week:   . Minutes of Exercise per Session:   Stress:   . Feeling of Stress :   Social Connections:   . Frequency of Communication with Friends and Family:   . Frequency of Social Gatherings with Friends and Family:   . Attends Religious Services:   . Active Member of Clubs or Organizations:   . Attends Archivist Meetings:   Marland Kitchen Marital Status:   Intimate Partner Violence:   . Fear of Current or Ex-Partner:   . Emotionally Abused:   Marland Kitchen Physically Abused:   . Sexually Abused:     Review of Systems  Constitutional: Negative for fatigue and unexpected weight change.  Eyes: Negative for visual disturbance.  Respiratory: Negative for cough, chest tightness and shortness of breath.   Cardiovascular: Negative for chest pain, palpitations and leg swelling.  Gastrointestinal: Negative for abdominal pain and blood in stool.  Neurological: Negative for dizziness, light-headedness and headaches.    Per hpi.    Objective:   Vitals:   04/28/20 1549 04/28/20 1606  BP: 121/83   Pulse: (!) 125 (!) 117  Temp:  98 F (36.7 C)   TempSrc: Temporal   SpO2: 96%   Weight: 270 lb (122.5 kg)   Height: _0  (1.905 m)      Physical Exam Vitals reviewed.  Constitutional:      Appearance: He is well-developed.  HENT:     Head: Normocephalic and atraumatic.  Eyes:     Pupils: Pupils are equal, round, and reactive to light.  Neck:     Vascular: No carotid bruit or JVD.  Cardiovascular:     Rate and Rhythm: Normal rate and regular rhythm.     Heart sounds: Normal heart sounds.  No murmur heard.   Pulmonary:     Effort: Pulmonary effort is normal.     Breath sounds: Normal breath sounds. No rales.  Skin:    General: Skin is warm and dry.  Neurological:     Mental Status: He is alert and oriented to person, place, and time.        Assessment & Plan:  David Crane is a 54 y.o. male . Fatigue, unspecified type  -Improved since last visit.  We will continue to hold ACE inhibitor for now.  As he continues to improve consider restart.   Chest discomfort  -Fleeting left upper chest, reports more musculoskeletal type symptoms, but with history of diabetes and prior EKG possible abnormalities, we will have him still evaluated with cardiology.  ER/911 chest pain precautions reviewed.  -Chronic tachycardia, TSH reassuring, CBC reassuring last visit.  Type 2 diabetes mellitus with microalbuminuria, without long-term current use of insulin (HCC) - Plan: Hemoglobin A1c  -Check A1c no med changes at this time.  No orders of the defined types were placed in this encounter.  Patient Instructions    Continue follow up as planned with cardiology. If any increased chest symptoms or worsening be seen sooner.  Ok to continue to hold the lisinopril at this time.   Continue same diabetes meds for now.  Recheck in 3 months.  I will check A1c and let you know if any changes needed, but if close the new job likely will help with activity and weight loss.  Return to the clinic or go to the nearest  emergency room if any of your symptoms worsen or new symptoms occur.   If you have lab work done today you will be contacted with your lab results within the next 2 weeks.  If you have not heard from [place] then please contact us. The fastest way to get your results is to register for My Chart.   IF you received an x-ray today, you will receive an invoice from [facility]. Please contact [facility] at [phone] with questions or concerns regarding your invoice.   IF you received labwork today, you will receive an invoice from [facility]. Please contact [facility] at [phone] with questions or concerns regarding your invoice.   Our billing staff will not be able to assist you with questions regarding bills from these companies.  You will be contacted with the lab results as soon as they are available. The fastest way to get your results is to activate your My Chart account. Instructions are located on the last page of this paperwork. If you have not heard from [place] regarding the results in 2 weeks, please contact this office.         Signed, [name], MD [facility]

## 2020-04-28 NOTE — Patient Instructions (Addendum)
  Continue follow up as planned with cardiology. If any increased chest symptoms or worsening be seen sooner.  Ok to continue to hold the lisinopril at this time.   Continue same diabetes meds for now.  Recheck in 3 months.  I will check A1c and let you know if any changes needed, but if close the new job likely will help with activity and weight loss.  Return to the clinic or go to the nearest emergency room if any of your symptoms worsen or new symptoms occur.   If you have lab work done today you will be contacted with your lab results within the next 2 weeks.  If you have not heard from [place] then please contact us. The fastest way to get your results is to register for My Chart.   IF you received an x-ray today, you will receive an invoice from [facility]. Please contact [facility] at [phone] with questions or concerns regarding your invoice.   IF you received labwork today, you will receive an invoice from [place]. Please contact [facility] at [phone] with questions or concerns regarding your invoice.   Our billing staff will not be able to assist you with questions regarding bills from these companies.  You will be contacted with the lab results as soon as they are available. The fastest way to get your results is to activate your My Chart account. Instructions are located on the last page of this paperwork. If you have not heard from [place] regarding the results in 2 weeks, please contact this office.

## 2020-04-28 NOTE — Telephone Encounter (Signed)
Information noted in chart about telephone call with cardiology after today's visit. Fatigue has improved, still some fleeting left upper chest sensation at rest, seems to be more muscular, no exertional chest pain.  I still would like him to be followed up with Dr. Audie Box and I am happy to discuss if needed.

## 2020-04-29 LAB — HEMOGLOBIN A1C
Est. average glucose Bld gHb Est-mCnc: 163 mg/dL
Hgb A1c MFr Bld: 7.3 % — ABNORMAL HIGH (ref 4.8–5.6)

## 2020-04-29 NOTE — Telephone Encounter (Signed)
I agree a diabetic with CP needs evaluation. I always have a low threshold for CTA/stress test. We will get him in.   [place] T. Audie Box, [place]  [address] [place], [place] [address] [phone]  6:52 AM

## 2020-05-01 ENCOUNTER — Encounter: Payer: Self-pay | Admitting: Family Medicine

## 2020-05-29 NOTE — Progress Notes (Signed)
Cardiology Office Note:   Date:  05/31/2020  NAME:  David Crane    MRN: [id number] DOB:  [birth year]   PCP:  Wendie Agreste, MD  Cardiologist:  No primary care provider on file.  Electrophysiologist:  None   Referring MD: Wendie Agreste, MD   Chief Complaint  Patient presents with  . Tachycardia    History of Present Illness:   David Crane is a 54 y.o. male with a hx of DM, HLD who is being seen today for the evaluation of tachycardia at the request of Wendie Agreste, MD.  He reports for the last 3 to 4 months he had intermittent episodes of tightness in his chest.  He reports symptoms can be brought on by stress.  They last 5 minutes and go away.  They can occur at any time.  No real identifiable trigger other than stress.  He reports he is noticing occasionally just while sitting.  They go away without any intervention.  He reports they can occur when he gets overheated or in the hot sun.  He also reports when he is on his feet for prolonged periods of time he can get similar symptoms.  They occur about 1 time per week.  He started a new job recently.  There is significant stress.  Apparently there a long hours involved.  He is not sure if he is ready for this.  Given his age she is not willing to work that much.  He reports that he is also getting short of breath with exertion.  He reports during coronavirus he was out of work and was exercising more.  Since he started his new job is not exercising as much.  He reports simple activities such as walking up a flight of stairs and getting short of breath.  He reports no edema in the lower extremities.  He reports no rapid heartbeat sensation.  His primary care physician is also noticed that he is tachycardic.  His heart rate today is 98.  His EKG demonstrates normal sinus rhythm with no acute ischemic change or evidence of prior infarction.  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  He has normal Q waves today on exam.  He has been diabetic for 2 to 3 years.  Most recent LDL cholesterol 44.  He is a never smoker.  He consumes alcohol in moderation.  No illicit drug use is reported.  He is married with 2 children.  There is a family history of heart disease in his brother.  Most recent thyroid study was normal.  He denies chest pain, shortness of breath or palpitations in office today.  TSH 1.17  Problem List 1. DM -A1c 7.3 2. HLD -T chol 101, HDL 39, LDL 44, TG 96  Past Medical History: Past Medical History:  Diagnosis Date  . Diabetes mellitus without complication ([place])   . GERD (gastroesophageal reflux disease)   . Hyperlipidemia   . Hypertension     Past Surgical History: Past Surgical History:  Procedure Laterality Date  . Growth removed Left   . TONSILLECTOMY    . WISDOM TOOTH EXTRACTION      Current Medications: Current Meds  Medication Sig  . atorvastatin (LIPITOR) 10 MG tablet Take 1 tablet (10 mg total) by mouth daily.  . blood glucose meter kit and supplies Dispense based on patient and insurance preference. Check twice per day. FOR ICD-9  250.00, 250.01).  Marland Kitchen gabapentin (NEURONTIN) 300 MG capsule TAKE 1-2 CAPSULES BY MOUTH IN THE MORNING AND 2 CAPSULES AT BEDTIME  . glipiZIDE (GLUCOTROL) 5 MG tablet TAKE 1 TABLET BY MOUTH TWICE DAILY BEFORE A MEAL  . glucose blood test strip Use as instructed  . Lancets 30G MISC Check sugar twice a day before meals  . metFORMIN (GLUCOPHAGE) 850 MG tablet Take 1 tablet (850 mg total) by mouth 2 (two) times daily with a meal.     Allergies:    Patient has no known allergies.   Social History: Social History   Socioeconomic History  . Marital status: Married    Spouse name: Not on file  . Number of children: 2  . Years of education: Not on file  . Highest education level: Not on file  Occupational History  . Not on file  Tobacco Use  . Smoking status: Never Smoker  . Smokeless tobacco: Never  Used  Vaping Use  . Vaping Use: Never used  Substance and Sexual Activity  . Alcohol use: Yes    Comment: occ  . Drug use: No  . Sexual activity: Yes  Other Topics Concern  . Not on file  Social History Narrative  . Not on file   Social Determinants of Health   Financial Resource Strain:   . Difficulty of Paying Living Expenses: Not on file  Food Insecurity:   . Worried About Charity fundraiser in the Last Year: Not on file  . Ran Out of Food in the Last Year: Not on file  Transportation Needs:   . Lack of Transportation (Medical): Not on file  . Lack of Transportation (Non-Medical): Not on file  Physical Activity:   . Days of Exercise per Week: Not on file  . Minutes of Exercise per Session: Not on file  Stress:   . Feeling of Stress : Not on file  Social Connections:   . Frequency of Communication with Friends and Family: Not on file  . Frequency of Social Gatherings with Friends and Family: Not on file  . Attends Religious Services: Not on file  . Active Member of Clubs or Organizations: Not on file  . Attends Archivist Meetings: Not on file  . Marital Status: Not on file     Family History: The patient's family history includes Cancer in his father; Colon cancer in his father; Diabetes in his father; Heart failure in his brother. There is no history of Esophageal cancer, Rectal cancer, or Stomach cancer.  ROS:   All other ROS reviewed and negative. Pertinent positives noted in the HPI.     EKGs/Labs/Other Studies Reviewed:   The following studies were personally reviewed by me today:  EKG:  EKG is ordered today.  The ekg ordered today demonstrates normal sinus rhythm, heart rate 98, no acute ischemic changes, no evidence of prior infarction, and was personally reviewed by me.   Recent Labs: 01/20/2020: ALT 15 04/14/2020: BUN 27; Creatinine, Ser 0.94; Hemoglobin 14.1; Platelets 305; Potassium 4.8; Sodium 136; TSH 1.170   Recent Lipid Panel      Component Value Date/Time   CHOL 101 01/20/2020 1111   TRIG 96 01/20/2020 1111   HDL 39 (L) 01/20/2020 1111   CHOLHDL 2.6 01/20/2020 1111   LDLCALC 44 01/20/2020 1111    Physical Exam:   VS:  BP 120/80   Pulse 98   Ht _0  (1.905 m)   Wt 273 lb 3.2 oz (123.9 kg)  BMI 34.15 kg/m    Wt Readings from Last 3 Encounters:  05/31/20 273 lb 3.2 oz (123.9 kg)  04/28/20 270 lb (122.5 kg)  04/14/20 (!) 270 lb 3.2 oz (122.6 kg)    General: Well nourished, well developed, in no acute distress Heart: Atraumatic, normal size  Eyes: PEERLA, EOMI  Neck: Supple, no JVD Endocrine: No thryomegaly Cardiac: Normal S1, S2; RRR; no murmurs, rubs, or gallops Lungs: Clear to auscultation bilaterally, no wheezing, rhonchi or rales  Abd: Soft, nontender, no hepatomegaly  Ext: No edema, pulses 2+ Musculoskeletal: No deformities, BUE and BLE strength normal and equal Skin: Warm and dry, no rashes   Neuro: Alert and oriented to person, place, time, and situation, CNII-XII grossly intact, no focal deficits  Psych: Normal mood and affect   ASSESSMENT:   David Crane is a 54 y.o. male who presents for the following: 1. Tachycardia   2. Chest pain of uncertain etiology   3. SOB (shortness of breath)     PLAN:   1. Tachycardia 2. Chest pain of uncertain etiology 3. SOB (shortness of breath) -Heart rate 98 today.  EKG without acute ischemic changes or evidence of prior infarction.  These are normal Q waves on his EKG.  He is diabetic with atypical chest pain.  I would like to proceed with a coronary CTA.  He will take 100 mg of Toprol tartrate tonight before the scan.  He will give [place] a BMP and BNP today.  I would also like for him to get an echocardiogram.  The BNP and echo will exclude heart failure.  He has no evidence of volume overload on exam today.  Overall, I think his symptoms are stress related.  However given his diabetes he is high risk for coronary disease.  His most recent LDL  cholesterol is 44 which places him extremely low risk however his diabetes is still a risk factor.  I think it is worthwhile to proceed with a full cardiac work-up given his history of diabetes and recent onset of new symptoms of chest pain, shortness of breath.  Disposition: Return if symptoms worsen or fail to improve.  Medication Adjustments/Labs and Tests Ordered: Current medicines are reviewed at length with the patient today.  Concerns regarding medicines are outlined above.  No orders of the defined types were placed in this encounter.  No orders of the defined types were placed in this encounter.   Patient Instructions  Medication Instructions:  Take Metoprolol 100 mg two hours before the CT scan  *If you need a refill on your cardiac medications before your next appointment, please call your pharmacy*   Lab Work: BMET, BNP  If you have labs (blood work) drawn today and your tests are completely normal, you will receive your results only by: Marland Kitchen MyChart Message (if you have MyChart) OR . A paper copy in the mail If you have any lab test that is abnormal or we need to change your treatment, we will call you to review the results.   Testing/Procedures: Your physician has requested that you have cardiac CT. Cardiac computed tomography (CT) is a painless test that uses an x-ray machine to take clear, detailed pictures of your heart. For further information please visit [email or web address].[email or web address]. Please follow instruction sheet as given.  Echocardiogram - Your physician has requested that you have an echocardiogram. Echocardiography is a painless test that uses sound waves to create images of your heart. It provides your doctor with information about the  size and shape of your heart and how well your heart's chambers and valves are working. This procedure takes approximately one hour. There are no restrictions for this procedure. This will be performed at our [facility] location - [address].   Follow-Up: At [facility], you and your health needs are our priority.  As part of our continuing mission to provide you with exceptional heart care, we have created designated Provider Care Teams.  These Care Teams include your primary Cardiologist (physician) and Advanced Practice Providers (APPs -  Physician Assistants and Nurse Practitioners) who all work together to provide you with the care you need, when you need it.  We recommend signing up for the patient portal called "MyChart".  Sign up information is provided on this After Visit Summary.  MyChart is used to connect with patients for Virtual Visits (Telemedicine).  Patients are able to view lab/test results, encounter notes, upcoming appointments, etc.  Non-urgent messages can be sent to your provider as well.   To learn more about what you can do with MyChart, go to [email or web address].    Your next appointment:   As needed  The format for your next appointment:   In Person  Provider:   Eleonore Chiquito, MD   Other Instructions  Your cardiac CT will be scheduled at one of the below locations:   [facility] [address] [place], [place] [address] [phone]  [place] [address] [place], [place] [address] [phone]  If scheduled at [facility], please arrive at the [facility] main entrance of [facility] 30 minutes prior to test start time. Proceed to the [facility] Radiology Department (first floor) to check-in and test prep.  If scheduled at [facility], please arrive 15 mins early for check-in and test prep.  Please follow these instructions carefully (unless otherwise directed):  Hold all erectile dysfunction medications at least 3 days (72 hrs) prior to test.  On the Night Before the Test: . Be sure to Drink plenty of water. . Do not consume any  caffeinated/decaffeinated beverages or chocolate 12 hours prior to your test. . Do not take any antihistamines 12 hours prior to your test.  On the Day of the Test: . Drink plenty of water. Do not drink any water within one hour of the test. . Do not eat any food 4 hours prior to the test. . You may take your regular medications prior to the test.  . Take metoprolol (Lopressor) two hours prior to test. . HOLD Furosemide/Hydrochlorothiazide morning of the test. . FEMALES- please wear underwire-free bra if available      After the Test: . Drink plenty of water. . After receiving IV contrast, you may experience a mild flushed feeling. This is normal. . On occasion, you may experience a mild rash up to 24 hours after the test. This is not dangerous. If this occurs, you can take Benadryl 25 mg and increase your fluid intake. . If you experience trouble breathing, this can be serious. If it is severe call 911 IMMEDIATELY. If it is mild, please call our office. . If you take any of these medications: Glipizide/Metformin, Avandament, Glucavance, please do not take 48 hours after completing test unless otherwise instructed.   Once we have confirmed authorization from your insurance company, we will call you to set up a date and time for your test. Based  on how quickly your insurance processes prior authorizations requests, please allow up to 4 weeks to be contacted for scheduling your Cardiac CT appointment. Be advised that routine Cardiac CT appointments could be scheduled as many as 8 weeks after your provider has ordered it.  For non-scheduling related questions, please contact the cardiac imaging nurse navigator should you have any questions/concerns: Marchia Bond, Cardiac Imaging Nurse Navigator Burley Saver, Interim Cardiac Imaging Nurse [place] and Vascular Services Direct Office Dial: [phone]   For scheduling needs, including cancellations and rescheduling, please call  Vivien Rota at [phone], option 3.         Signed, Addison Naegeli. Audie Box, [place]  [address], [place], [place] [address] [phone]  05/31/2020 8:49 AM

## 2020-05-31 ENCOUNTER — Other Ambulatory Visit: Payer: Self-pay

## 2020-05-31 ENCOUNTER — Ambulatory Visit (INDEPENDENT_AMBULATORY_CARE_PROVIDER_SITE_OTHER): Payer: 59 | Admitting: Cardiovascular Disease

## 2020-05-31 ENCOUNTER — Encounter: Payer: Self-pay | Admitting: Cardiovascular Disease

## 2020-05-31 VITALS — BP 120/80 | HR 98 | Ht 75.0 in | Wt 273.2 lb

## 2020-05-31 DIAGNOSIS — R000 Tachycardia, unspecified: Secondary | ICD-10-CM | POA: Diagnosis not present

## 2020-05-31 DIAGNOSIS — R0602 Shortness of breath: Secondary | ICD-10-CM

## 2020-05-31 DIAGNOSIS — R079 Chest pain, unspecified: Secondary | ICD-10-CM | POA: Diagnosis not present

## 2020-05-31 MED ORDER — METOPROLOL TARTRATE 100 MG PO TABS
ORAL_TABLET | ORAL | 0 refills | Status: DC
Start: 2020-05-31 — End: 2020-08-04

## 2020-05-31 NOTE — Patient Instructions (Signed)
Medication Instructions:  Take Metoprolol 100 mg two hours before the CT scan  *If you need a refill on your cardiac medications before your next appointment, please call your pharmacy*   Lab Work: BMET, BNP  If you have labs (blood work) drawn today and your tests are completely normal, you will receive your results only by: Marland Kitchen MyChart Message (if you have MyChart) OR . A paper copy in the mail If you have any lab test that is abnormal or we need to change your treatment, we will call you to review the results.   Testing/Procedures: Your physician has requested that you have cardiac CT. Cardiac computed tomography (CT) is a painless test that uses an x-ray machine to take clear, detailed pictures of your heart. For further information please visit [email or web address].[email or web address]. Please follow instruction sheet as given.  Echocardiogram - Your physician has requested that you have an echocardiogram. Echocardiography is a painless test that uses sound waves to create images of your heart. It provides your doctor with information about the size and shape of your heart and how well your heart's chambers and valves are working. This procedure takes approximately one hour. There are no restrictions for this procedure. This will be performed at our [facility] location - [address].   Follow-Up: At [facility], you and your health needs are our priority.  As part of our continuing mission to provide you with exceptional heart care, we have created designated Provider Care Teams.  These Care Teams include your primary Cardiologist (physician) and Advanced Practice Providers (APPs -  Physician Assistants and Nurse Practitioners) who all work together to provide you with the care you need, when you need it.  We recommend signing up for the patient portal called "MyChart".  Sign up information is provided on this After Visit Summary.  MyChart is used to connect with patients for Virtual Visits  (Telemedicine).  Patients are able to view lab/test results, encounter notes, upcoming appointments, etc.  Non-urgent messages can be sent to your provider as well.   To learn more about what you can do with MyChart, go to [email or web address].    Your next appointment:   As needed  The format for your next appointment:   In Person  Provider:   Eleonore Chiquito, MD   Other Instructions  Your cardiac CT will be scheduled at one of the below locations:   [facility] [address] [place], [place] [address] [phone]  [place] [address] [place], [place] [address] [phone]  If scheduled at [facility], please arrive at the [facility] main entrance of [facility] 30 minutes prior to test start time. Proceed to the [facility] Radiology Department (first floor) to check-in and test prep.  If scheduled at [facility], please arrive 15 mins early for check-in and test prep.  Please follow these instructions carefully (unless otherwise directed):  Hold all erectile dysfunction medications at least 3 days (72 hrs) prior to test.  On the Night Before the Test: . Be sure to Drink plenty of water. . Do not consume any caffeinated/decaffeinated beverages or chocolate 12 hours prior to your test. . Do not take any antihistamines 12 hours prior to your test.  On the Day of the Test: . Drink plenty of water. Do not drink any water within one hour of the test. . Do not eat any food 4  hours prior to the test. . You may take your regular medications prior to the test.  . Take metoprolol (Lopressor) two hours prior to test. . HOLD Furosemide/Hydrochlorothiazide morning of the test. . FEMALES- please wear underwire-free bra if available      After the Test: . Drink plenty of water. . After receiving IV contrast, you may experience a mild flushed feeling. This is  normal. . On occasion, you may experience a mild rash up to 24 hours after the test. This is not dangerous. If this occurs, you can take Benadryl 25 mg and increase your fluid intake. . If you experience trouble breathing, this can be serious. If it is severe call 911 IMMEDIATELY. If it is mild, please call our office. . If you take any of these medications: Glipizide/Metformin, Avandament, Glucavance, please do not take 48 hours after completing test unless otherwise instructed.   Once we have confirmed authorization from your insurance company, we will call you to set up a date and time for your test. Based on how quickly your insurance processes prior authorizations requests, please allow up to 4 weeks to be contacted for scheduling your Cardiac CT appointment. Be advised that routine Cardiac CT appointments could be scheduled as many as 8 weeks after your provider has ordered it.  For non-scheduling related questions, please contact the cardiac imaging nurse navigator should you have any questions/concerns: Marchia Bond, Cardiac Imaging Nurse Navigator Burley Saver, Interim Cardiac Imaging Nurse [place] and Vascular Services Direct Office Dial: [phone]   For scheduling needs, including cancellations and rescheduling, please call Vivien Rota at [phone], option 3.

## 2020-06-01 LAB — BASIC METABOLIC PANEL
BUN/Creatinine Ratio: 25 — ABNORMAL HIGH (ref 9–20)
BUN: 34 mg/dL — ABNORMAL HIGH (ref 6–24)
CO2: 23 mmol/L (ref 20–29)
Calcium: 10 mg/dL (ref 8.7–10.2)
Chloride: 95 mmol/L — ABNORMAL LOW (ref 96–106)
Creatinine, Ser: 1.34 mg/dL — ABNORMAL HIGH (ref 0.76–1.27)
GFR calc Af Amer: 69 mL/min/{1.73_m2} (ref >59)
GFR calc non Af Amer: 60 mL/min/{1.73_m2} (ref >59)
Glucose: 161 mg/dL — ABNORMAL HIGH (ref 65–99)
Potassium: 5.2 mmol/L (ref 3.5–5.2)
Sodium: 137 mmol/L (ref 134–144)

## 2020-06-01 LAB — BRAIN NATRIURETIC PEPTIDE: BNP: 16.3 pg/mL (ref 0.0–100.0)

## 2020-06-09 ENCOUNTER — Telehealth (HOSPITAL_COMMUNITY): Payer: Self-pay | Admitting: Emergency Medicine

## 2020-06-09 NOTE — Telephone Encounter (Signed)
Reaching out to patient to offer assistance regarding upcoming cardiac imaging study; pt verbalizes understanding of appt date/time, parking situation and where to check in, pre-test NPO status and medications ordered, and verified current allergies; name and call back number provided for further questions should they arise Petina Muraski RN Navigator Cardiac Imaging [facility] [phone] office [phone] cell 

## 2020-06-13 ENCOUNTER — Other Ambulatory Visit: Payer: Self-pay

## 2020-06-13 ENCOUNTER — Ambulatory Visit (HOSPITAL_COMMUNITY)
Admission: RE | Admit: 2020-06-13 | Discharge: 2020-06-13 | Disposition: A | Discharge status: Home / Self Care | Payer: 59 | Source: Ambulatory Visit | Attending: Cardiovascular Disease | Admitting: Cardiovascular Disease

## 2020-06-13 ENCOUNTER — Encounter (HOSPITAL_COMMUNITY): Payer: Self-pay

## 2020-06-13 DIAGNOSIS — R079 Chest pain, unspecified: Secondary | ICD-10-CM | POA: Insufficient documentation

## 2020-06-13 IMAGING — CT CT HEART MORP W/ CTA COR W/ SCORE W/ CA W/CM &/OR W/O CM
1 series · 12 of 18 positions shown, 15 images · non-contrast
Comparison: None.

Addendum:
CLINICAL DATA: Chest pain

EXAM:
Cardiac/Coronary CTA
TECHNIQUE: The patient was scanned on a [employer] Force scanner. A 100 kV
prospective scan was triggered in the descending thoracic aorta at
111 HU's. Axial non-contrast 3 mm slices were carried out through
the heart. The data set was analyzed on a dedicated work station and
scored using the Agatson method. Gantry rotation speed was 250 msecs
and collimation was .6 mm. No beta blockade and 0.8 mg of sl NTG was
given. The 3D data set was reconstructed in 5% intervals of the
35-75 % of the R-R cycle. Diastolic phases were analyzed on a
dedicated work station using MPR, MIP and VRT modes. The patient
received 80 cc of contrast.

[Series 445: findings · 12 of 18 slices shown, 15 images]
[im 2/18  vessel]
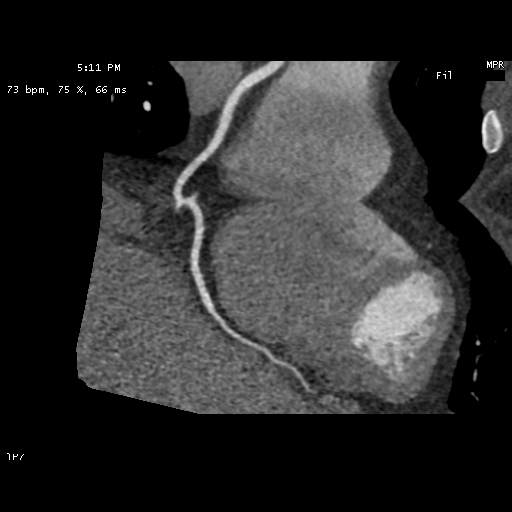
[im 2/18  lung]
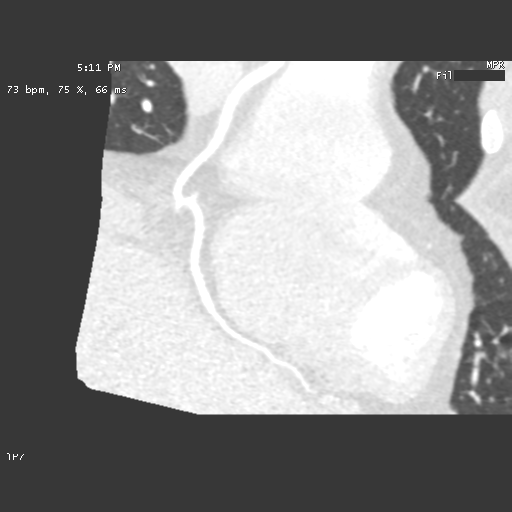
[im 4/18  vessel]
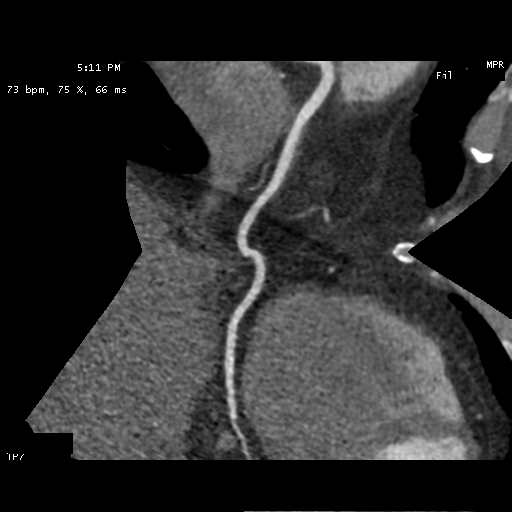
[im 5/18  vessel]
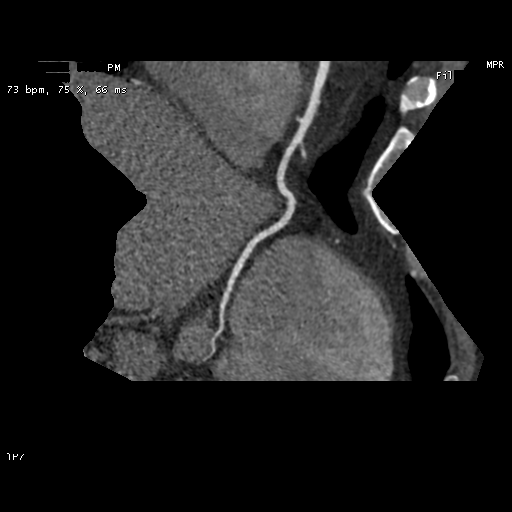
[im 6/18  vessel]
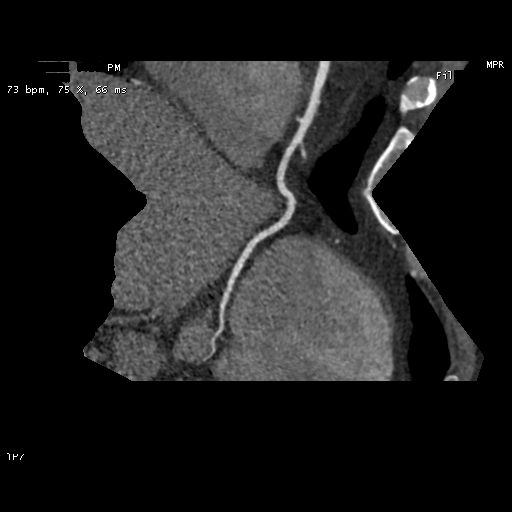
[im 8/18  vessel]
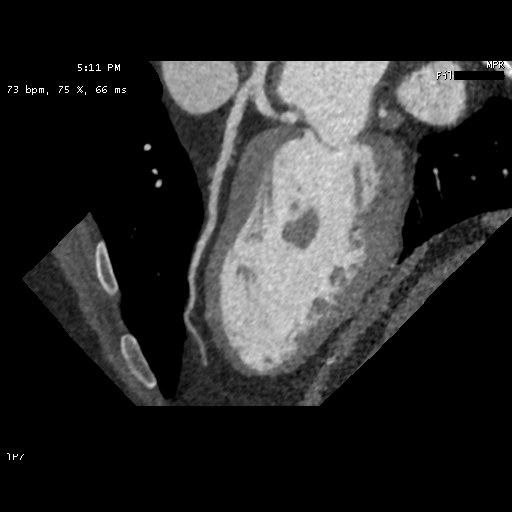
[im 8/18  lung]
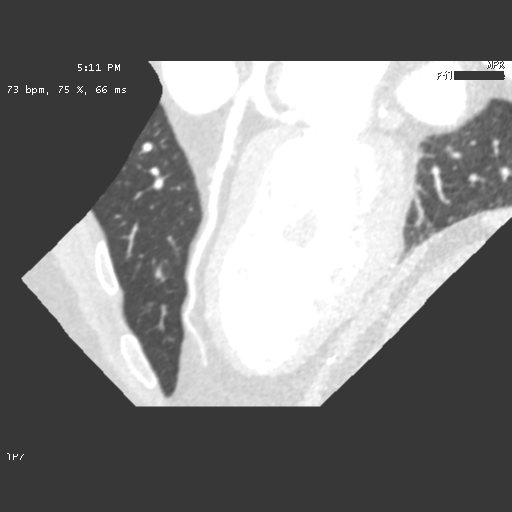
[im 9/18  vessel]
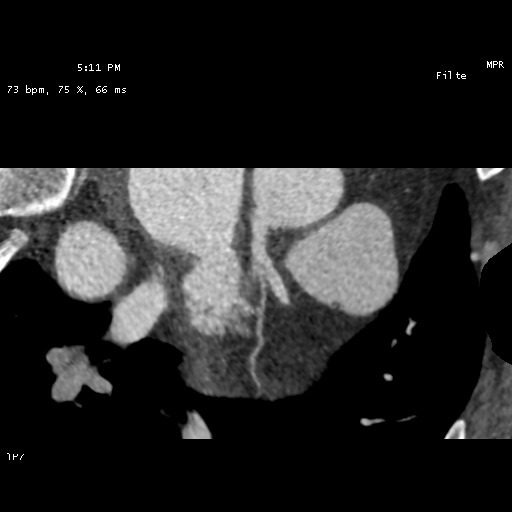
[im 10/18  vessel]
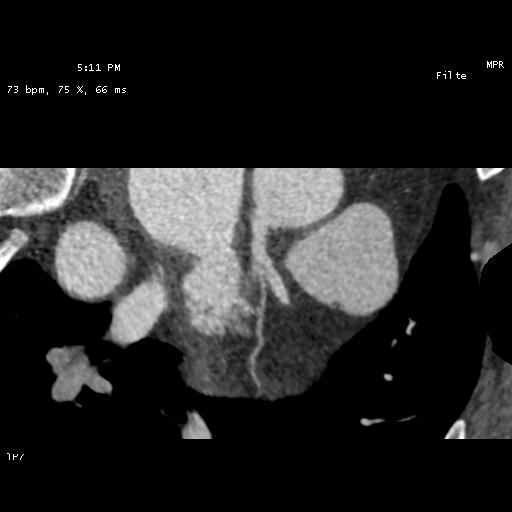
[im 12/18  vessel]
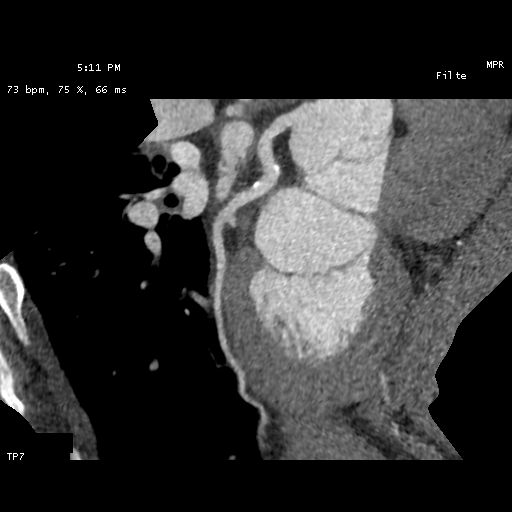
[im 13/18  vessel]
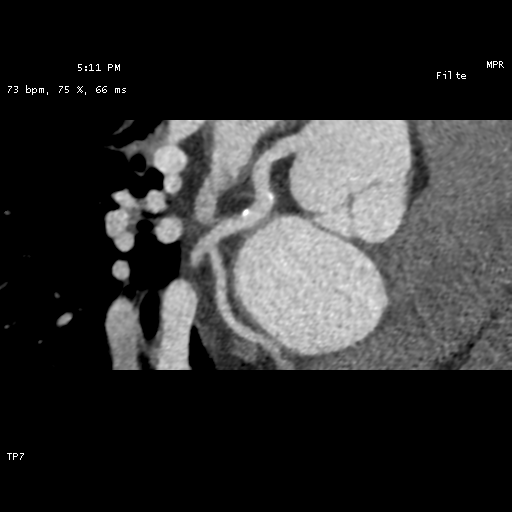
[im 13/18  lung]
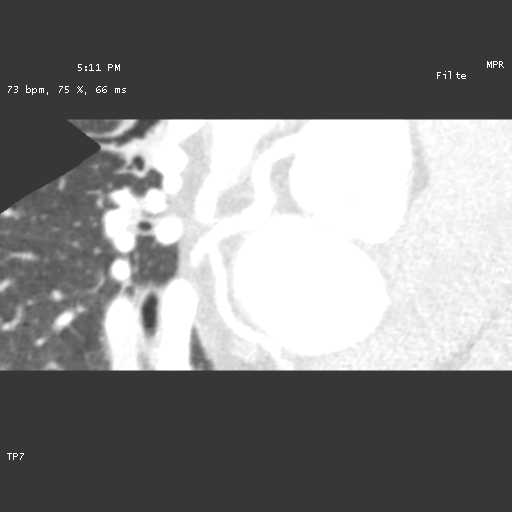
[im 14/18  vessel]
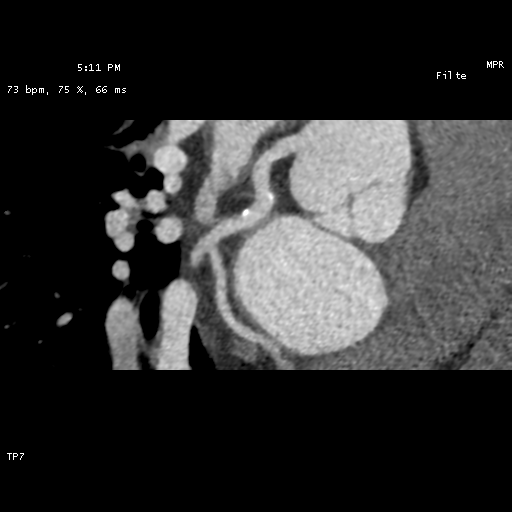
[im 16/18  vessel]
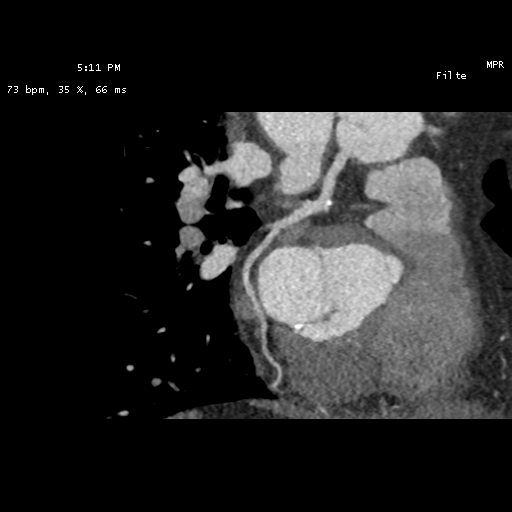
[im 17/18  vessel]
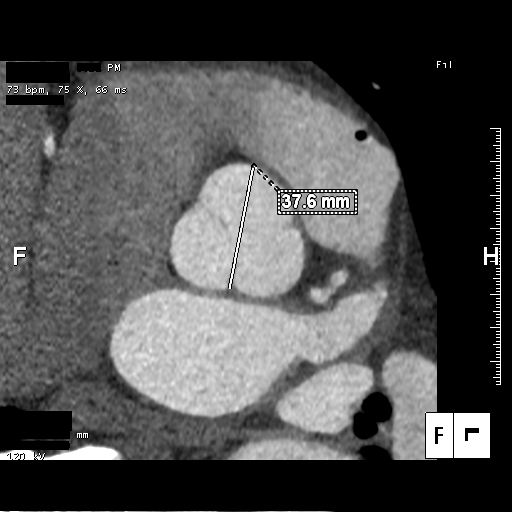

[12 of 18 positions shown; findings below may reference images not displayed]

FINDINGS: Image quality: Average.

Noise artifact is: Limited.

Coronary Arteries:  Normal coronary origin.  Right dominance.

Left main: The left main is a large caliber vessel with a normal
take off from the left coronary cusp that trifurcates into a LAD,
LCX, and ramus intermedius. There is no plaque or stenosis.

Left anterior descending artery: The proximal LAD contains minimal
calcified plaque (<25%). The mid and distal segments are patent. The
LAD gives off 1 patent diagonal branch.

Ramus intermedius: Small vessel that is patent with no evidence of
plaque or stenosis.

Left circumflex artery: The LCX is non-dominant. The proximal LCX
contains minimal calcified plaque (<25%). The mid and distal
segments are patent. The LCX gives off 2 patent obtuse marginal
branches.

Right coronary artery: The RCA is dominant with normal take off from
the right coronary cusp. The proximal RCA contains minimal calcified
plaque (<25%). The mid and distal segments are patent. The RCA
terminates as a PDA and right posterolateral branch without evidence
of plaque or stenosis.

Right Atrium: Right atrial size is within normal limits.

Right Ventricle: The right ventricular cavity is within normal
limits.

Left Atrium: Left atrial size is normal in size with no left atrial
appendage filling defect. A small PFO is present.

Left Ventricle: The ventricular cavity size is within normal limits.
There are no stigmata of prior infarction. There is no abnormal
filling defect.

Pulmonary arteries: Normal in size without proximal filling defect.

Pulmonary veins: Normal pulmonary venous drainage.

Pericardium: Normal thickness with no significant effusion or
calcium present.

Cardiac valves: The aortic valve is trileaflet without significant
calcification. The mitral valve is normal structure with trace
mitral annular calcification.

Aorta: Normal caliber with no significant disease.

Extra-cardiac findings: See attached radiology report for
non-cardiac structures.
IMPRESSION: 1. Coronary calcium score of 75. This was 78th percentile for age
and sex matched controls.

2. Normal coronary origin with right dominance.

3. Minimal calcified plaque (<25%) in the LAD, LCX, and RCA.

4. Small PFO.

RECOMMENDATIONS:
1. Minimal non-obstructive CAD (0-24%). Consider non-atherosclerotic
causes of chest pain. Consider preventive therapy and risk factor
modification.

EXAM:
OVER-READ INTERPRETATION  CT CHEST

The following report is an over-read performed by radiologist Dr.
does not include interpretation of cardiac or coronary anatomy or
pathology. The coronary calcium score/coronary CTA interpretation by
the cardiologist is attached.
FINDINGS: The visualized portions of the lower lung fields show no suspicious
nodules, masses, or infiltrates. No pleural fluid seen.

The visualized portions of the mediastinum and chest wall are
unremarkable.
IMPRESSION: No significant non-cardiovascular abnormality seen in visualized
portion of the thorax.

*** End of Addendum ***
FINDINGS: Image quality: Average.

Noise artifact is: Limited.

Coronary Arteries:  Normal coronary origin.  Right dominance.

Left main: The left main is a large caliber vessel with a normal
take off from the left coronary cusp that trifurcates into a LAD,
LCX, and ramus intermedius. There is no plaque or stenosis.

Left anterior descending artery: The proximal LAD contains minimal
calcified plaque (<25%). The mid and distal segments are patent. The
LAD gives off 1 patent diagonal branch.

Ramus intermedius: Small vessel that is patent with no evidence of
plaque or stenosis.

Left circumflex artery: The LCX is non-dominant. The proximal LCX
contains minimal calcified plaque (<25%). The mid and distal
segments are patent. The LCX gives off 2 patent obtuse marginal
branches.

Right coronary artery: The RCA is dominant with normal take off from
the right coronary cusp. The proximal RCA contains minimal calcified
plaque (<25%). The mid and distal segments are patent. The RCA
terminates as a PDA and right posterolateral branch without evidence
of plaque or stenosis.

Right Atrium: Right atrial size is within normal limits.

Right Ventricle: The right ventricular cavity is within normal
limits.

Left Atrium: Left atrial size is normal in size with no left atrial
appendage filling defect. A small PFO is present.

Left Ventricle: The ventricular cavity size is within normal limits.
There are no stigmata of prior infarction. There is no abnormal
filling defect.

Pulmonary arteries: Normal in size without proximal filling defect.

Pulmonary veins: Normal pulmonary venous drainage.

Pericardium: Normal thickness with no significant effusion or
calcium present.

Cardiac valves: The aortic valve is trileaflet without significant
calcification. The mitral valve is normal structure with trace
mitral annular calcification.

Aorta: Normal caliber with no significant disease.

Extra-cardiac findings: See attached radiology report for
non-cardiac structures.
IMPRESSION: 1. Coronary calcium score of 75. This was 78th percentile for age
and sex matched controls.

2. Normal coronary origin with right dominance.

3. Minimal calcified plaque (<25%) in the LAD, LCX, and RCA.

4. Small PFO.

RECOMMENDATIONS:
1. Minimal non-obstructive CAD (0-24%). Consider non-atherosclerotic
causes of chest pain. Consider preventive therapy and risk factor
modification.

## 2020-06-13 MED ORDER — METOPROLOL TARTRATE 5 MG/5ML IV SOLN
5.0000 mg | INTRAVENOUS | Status: DC | PRN
  Administered 2020-06-13: 5 mg via INTRAVENOUS

## 2020-06-13 MED ORDER — NITROGLYCERIN 0.4 MG SL SUBL: 0.8000 mg | SUBLINGUAL_TABLET | Freq: Once | SUBLINGUAL | Status: AC

## 2020-06-13 MED ORDER — DILTIAZEM HCL 25 MG/5ML IV SOLN
5.0000 mg | INTRAVENOUS | Status: DC | PRN
  Administered 2020-06-13: 5 mg via INTRAVENOUS
  Filled 2020-06-13 (×2): qty 5

## 2020-06-13 MED ORDER — NITROGLYCERIN 0.4 MG SL SUBL
SUBLINGUAL_TABLET | SUBLINGUAL | Status: AC
  Administered 2020-06-13: 0.8 mg via SUBLINGUAL
  Filled 2020-06-13: qty 2

## 2020-06-13 MED ORDER — METOPROLOL TARTRATE 5 MG/5ML IV SOLN
INTRAVENOUS | Status: AC
  Administered 2020-06-13: 5 mg via INTRAVENOUS
  Filled 2020-06-13: qty 10

## 2020-06-13 MED ORDER — IOHEXOL 350 MG/ML SOLN
80.0000 mL | Freq: Once | INTRAVENOUS | Status: AC | PRN
  Administered 2020-06-13: 80 mL via INTRAVENOUS

## 2020-06-13 MED ORDER — DILTIAZEM HCL 25 MG/5ML IV SOLN
INTRAVENOUS | Status: AC
  Administered 2020-06-13: 5 mg via INTRAVENOUS
  Filled 2020-06-13: qty 5

## 2020-06-15 ENCOUNTER — Other Ambulatory Visit: Payer: Self-pay

## 2020-06-15 DIAGNOSIS — E785 Hyperlipidemia, unspecified: Secondary | ICD-10-CM

## 2020-06-15 DIAGNOSIS — E1129 Type 2 diabetes mellitus with other diabetic kidney complication: Secondary | ICD-10-CM

## 2020-06-15 DIAGNOSIS — R809 Proteinuria, unspecified: Secondary | ICD-10-CM

## 2020-06-15 MED ORDER — ATORVASTATIN CALCIUM 40 MG PO TABS: 40.0000 mg | ORAL_TABLET | Freq: Every day | ORAL | 1 refills | Status: DC

## 2020-06-15 MED ORDER — ASPIRIN EC 81 MG PO TBEC: 81.0000 mg | DELAYED_RELEASE_TABLET | Freq: Every day | ORAL | 3 refills | Status: AC

## 2020-06-16 ENCOUNTER — Ambulatory Visit (HOSPITAL_COMMUNITY): Payer: 59 | Attending: Cardiology

## 2020-06-16 ENCOUNTER — Other Ambulatory Visit: Payer: Self-pay

## 2020-06-16 DIAGNOSIS — R079 Chest pain, unspecified: Secondary | ICD-10-CM | POA: Diagnosis not present

## 2020-06-16 LAB — ECHOCARDIOGRAM COMPLETE
Area-P 1/2: 3.42 cm2
S' Lateral: 3.4 cm

## 2020-06-16 NOTE — Progress Notes (Signed)
Patient ID: David Crane, male   DOB: July 13, 1966, 54 y.o.   MRN: [id number]   Patient did not consent to the use of Definity.

## 2020-07-05 ENCOUNTER — Other Ambulatory Visit: Payer: Self-pay | Admitting: Family Medicine

## 2020-07-05 DIAGNOSIS — G629 Polyneuropathy, unspecified: Secondary | ICD-10-CM

## 2020-07-24 ENCOUNTER — Other Ambulatory Visit: Payer: Self-pay | Admitting: Family Medicine

## 2020-07-24 DIAGNOSIS — E1129 Type 2 diabetes mellitus with other diabetic kidney complication: Secondary | ICD-10-CM

## 2020-07-24 DIAGNOSIS — R809 Proteinuria, unspecified: Secondary | ICD-10-CM

## 2020-07-24 NOTE — Telephone Encounter (Signed)
Requested Prescriptions  Pending Prescriptions Disp Refills  . glipiZIDE (GLUCOTROL) 5 MG tablet [Pharmacy Med Name: glipiZIDE 5 MG Oral Tablet] 180 tablet 0    Sig: TAKE 1 TABLET BY MOUTH TWICE DAILY BEFORE A MEAL     Endocrinology:  Diabetes - Sulfonylureas Passed - 07/24/2020  1:02 PM      Passed - HBA1C is between 0 and 7.9 and within 180 days    Hgb A1c MFr Bld  Date Value Ref Range Status  04/28/2020 7.3 (H) 4.8 - 5.6 % Final    Comment:             Prediabetes: 5.7 - 6.4          Diabetes: >6.4          Glycemic control for adults with diabetes: <7.0          Passed - Valid encounter within last 6 months    Recent Outpatient Visits          2 months ago Fatigue, unspecified type   [facility], Ranell Patrick, MD   3 months ago Fatigue, unspecified type   [facility], Ranell Patrick, MD   6 months ago Type 2 diabetes mellitus with microalbuminuria, without long-term current use of insulin [facility])   [facility], Ranell Patrick, MD   9 months ago Type 2 diabetes mellitus with microalbuminuria, without long-term current use of insulin [facility])   [facility], Ranell Patrick, MD   1 year ago Low back pain without sciatica, unspecified back pain laterality, unspecified chronicity   [facility], Ranell Patrick, MD      Future Appointments            In 1 week Wendie Agreste, MD [facility], [facility]           . metFORMIN ([place]) 850 MG tablet [Pharmacy Med Name: metFORMIN HCl 850 MG Oral Tablet] 180 tablet 0    Sig: TAKE 1 TABLET BY MOUTH TWICE DAILY WITH A MEAL     Endocrinology:  Diabetes - Biguanides Failed - 07/24/2020  1:02 PM      Failed - Cr in normal range and within 360 days    Creatinine, Ser  Date Value Ref Range Status  05/31/2020 1.34 (H) 0.76 - 1.27 mg/dL Final         Passed - HBA1C is between 0 and 7.9 and within 180 days    Hgb A1c MFr Bld  Date Value Ref Range Status   04/28/2020 7.3 (H) 4.8 - 5.6 % Final    Comment:             Prediabetes: 5.7 - 6.4          Diabetes: >6.4          Glycemic control for adults with diabetes: <7.0          Passed - eGFR in normal range and within 360 days    GFR calc Af Amer  Date Value Ref Range Status  05/31/2020 69 >59 mL/min/1.73 Final    Comment:    **[facility] currently reports eGFR in compliance with the current**   recommendations of the [employer]. [facility] will   update reporting as new guidelines are published from the NKF-ASN   Task force.    GFR calc non Af Amer  Date Value Ref Range Status  05/31/2020 60 >59 mL/min/1.73 Final  Passed - Valid encounter within last 6 months    Recent Outpatient Visits          2 months ago Fatigue, unspecified type   [facility], Ranell Patrick, MD   3 months ago Fatigue, unspecified type   [facility], Ranell Patrick, MD   6 months ago Type 2 diabetes mellitus with microalbuminuria, without long-term current use of insulin [facility])   [facility], Ranell Patrick, MD   9 months ago Type 2 diabetes mellitus with microalbuminuria, without long-term current use of insulin [facility])   [facility], Ranell Patrick, MD   1 year ago Low back pain without sciatica, unspecified back pain laterality, unspecified chronicity   [facility], Ranell Patrick, MD      Future Appointments            In 1 week Carlota Raspberry Ranell Patrick, MD [facility] at [place], [facility]

## 2020-08-03 ENCOUNTER — Ambulatory Visit: Payer: 59 | Admitting: Family Medicine

## 2020-08-04 ENCOUNTER — Other Ambulatory Visit: Payer: Self-pay

## 2020-08-04 ENCOUNTER — Encounter: Payer: Self-pay | Admitting: Family Medicine

## 2020-08-04 ENCOUNTER — Ambulatory Visit (INDEPENDENT_AMBULATORY_CARE_PROVIDER_SITE_OTHER): Payer: 59 | Admitting: Family Medicine

## 2020-08-04 VITALS — BP 116/78 | HR 96 | Temp 98.1°F | Ht 75.0 in | Wt 277.2 lb

## 2020-08-04 DIAGNOSIS — E1129 Type 2 diabetes mellitus with other diabetic kidney complication: Secondary | ICD-10-CM

## 2020-08-04 DIAGNOSIS — E11319 Type 2 diabetes mellitus with unspecified diabetic retinopathy without macular edema: Secondary | ICD-10-CM | POA: Diagnosis not present

## 2020-08-04 DIAGNOSIS — R809 Proteinuria, unspecified: Secondary | ICD-10-CM

## 2020-08-04 DIAGNOSIS — G629 Polyneuropathy, unspecified: Secondary | ICD-10-CM

## 2020-08-04 DIAGNOSIS — E785 Hyperlipidemia, unspecified: Secondary | ICD-10-CM

## 2020-08-04 LAB — POCT GLYCOSYLATED HEMOGLOBIN (HGB A1C): Hemoglobin A1C: 8 % — AB (ref 4.0–5.6)

## 2020-08-04 MED ORDER — GABAPENTIN 300 MG PO CAPS: ORAL_CAPSULE | ORAL | 0 refills | Status: DC

## 2020-08-04 MED ORDER — METFORMIN HCL 1000 MG PO TABS: ORAL_TABLET | ORAL | 1 refills | Status: DC

## 2020-08-04 MED ORDER — ATORVASTATIN CALCIUM 40 MG PO TABS: 40.0000 mg | ORAL_TABLET | Freq: Every day | ORAL | 1 refills | Status: DC

## 2020-08-04 MED ORDER — GLIPIZIDE 5 MG PO TABS: ORAL_TABLET | ORAL | 1 refills | Status: DC

## 2020-08-04 NOTE — Patient Instructions (Addendum)
  We could try low dose losartan for protein in urine if needed.  Increase metformin to 1000mg  twice per day.  No other changes for now.  Increased walking/activity can help. If any low sugar readings let me know.    If you have lab work done today you will be contacted with your lab results within the next 2 weeks.  If you have not heard from [place] then please contact us. The fastest way to get your results is to register for My Chart.   IF you received an x-ray today, you will receive an invoice from [facility]. Please contact [facility] at [phone] with questions or concerns regarding your invoice.   IF you received labwork today, you will receive an invoice from [facility]. Please contact [facility] at [phone] with questions or concerns regarding your invoice.   Our billing staff will not be able to assist you with questions regarding bills from these companies.  You will be contacted with the lab results as soon as they are available. The fastest way to get your results is to activate your My Chart account. Instructions are located on the last page of this paperwork. If you have not heard from [place] regarding the results in 2 weeks, please contact this office.

## 2020-08-04 NOTE — Progress Notes (Signed)
Subjective:  Patient ID: David Crane, male    DOB: 11/10/1965  Age: 54 y.o. MRN: [id number]  CC:  Chief Complaint  Patient presents with  . Medical Management of Chronic Issues    F/u for diabetes    HPI David Crane presents for   Diabetes: Complicated by peripheral neuropathy, microalbuminuria, retinopathy and followed by retina specialist.  He has been treated with Metformin 850 mg twice daily, glipizide 5 mg twice daily,  statin and gabapentin for peripheral neuropathy - 1 in am, 2 at night.  ACE inhibitor held previously with fatigue - did not feel well on ace inhibitor, tried starting back on lisinopril 2.74m - felt chest discomfort, so stopped taking it. Would like to different med if needed.  Fasting: none.   4 -5 hrs after meal 113-114?  Microalbumin: Ratio 83 in September 2020 Optho - retina specialist every 4 weeks. , foot exam, pneumovax: Due for Pneumovax, still refuses.   Evaluated by cardiology for previous chest symptoms, tachycardia.  Coronary artery CT on September 27, minimal nonobstructive CAD, CCS 751 70th percentile for age.  Recommended Lipitor increased to 40 mg daily and start aspirin 81 mg daily. Tolerating 431mper day dose past 2 months.  On asa 8155mD.  No history of GI bleeding.   Plan increase walking with the job when discussed in August.  Weight up 7 pounds since that time. That job was not a good fit - left after a month.  Less walking/exercise. Still looking for a job. few interviews.   Wt Readings from Last 3 Encounters:  08/04/20 277 lb 3.2 oz (125.7 kg)  05/31/20 273 lb 3.2 oz (123.9 kg)  04/28/20 270 lb (122.5 kg)    Lab Results  Component Value Date   HGBA1C 7.3 (H) 04/28/2020   HGBA1C 7.2 (H) 01/20/2020   HGBA1C 6.7 (A) 10/12/2019   Lab Results  Component Value Date   LDLCALC 44 01/20/2020   CREATININE 1.34 (H) 05/31/2020    History Patient Active Problem List   Diagnosis Date Noted  . Diabetic nephropathy ([facility]08/15/2018  . Diabetes ([place]8/15/2018  . Peripheral neuropathy 05/01/2017   Past Medical History:  Diagnosis Date  . Diabetes mellitus without complication ([place] . GERD (gastroesophageal reflux disease)   . Hyperlipidemia   . Hypertension    Past Surgical History:  Procedure Laterality Date  . Growth removed Left   . TONSILLECTOMY    . WISDOM TOOTH EXTRACTION     No Known Allergies Prior to Admission medications   Medication Sig Start Date End Date Taking? Authorizing Provider  aspirin EC 81 MG tablet Take 1 tablet (81 mg total) by mouth daily. Swallow whole. 06/15/20  Yes O'Neal, WesCassie FreerD  atorvastatin (LIPITOR) 40 MG tablet Take 1 tablet (40 mg total) by mouth daily. 06/15/20  Yes O'Neal, WesCassie FreerD  blood glucose meter kit and supplies Dispense based on patient and insurance preference. Check twice per day. FOR ICD-9 250.00, 250.01). 04/29/17  Yes GreWendie AgresteD  gabapentin (NEURONTIN) 300 MG capsule TAKE 1 TO 2 CAPSULES BY MOUTH IN THE MORNING AND 2 AT BEDTIME 07/05/20  Yes GreWendie AgresteD  glipiZIDE (GLUCOTROL) 5 MG tablet TAKE 1 TABLET BY MOUTH TWICE DAILY BEFORE A MEAL 07/24/20  Yes GreWendie AgresteD  glucose blood test strip Use as instructed 01/02/19  Yes GreWendie AgresteD  Lancets 30G MISC Check sugar twice a day before  meals 01/02/19  Yes Wendie Agreste, MD  metFORMIN (GLUCOPHAGE) 850 MG tablet TAKE 1 TABLET BY MOUTH TWICE DAILY WITH A MEAL 07/24/20  Yes Wendie Agreste, MD   Social History   Socioeconomic History  . Marital status: Married    Spouse name: Not on file  . Number of children: 2  . Years of education: Not on file  . Highest education level: Not on file  Occupational History  . Not on file  Tobacco Use  . Smoking status: Never Smoker  . Smokeless tobacco: Never Used  Vaping Use  . Vaping Use: Never used  Substance and Sexual Activity  . Alcohol use: Yes    Comment: occ  . Drug use: No  . Sexual activity:  Yes  Other Topics Concern  . Not on file  Social History Narrative  . Not on file   Social Determinants of Health   Financial Resource Strain:   . Difficulty of Paying Living Expenses: Not on file  Food Insecurity:   . Worried About Charity fundraiser in the Last Year: Not on file  . Ran Out of Food in the Last Year: Not on file  Transportation Needs:   . Lack of Transportation (Medical): Not on file  . Lack of Transportation (Non-Medical): Not on file  Physical Activity:   . Days of Exercise per Week: Not on file  . Minutes of Exercise per Session: Not on file  Stress:   . Feeling of Stress : Not on file  Social Connections:   . Frequency of Communication with Friends and Family: Not on file  . Frequency of Social Gatherings with Friends and Family: Not on file  . Attends Religious Services: Not on file  . Active Member of Clubs or Organizations: Not on file  . Attends Archivist Meetings: Not on file  . Marital Status: Not on file  Intimate Partner Violence:   . Fear of Current or Ex-Partner: Not on file  . Emotionally Abused: Not on file  . Physically Abused: Not on file  . Sexually Abused: Not on file    Review of Systems  Constitutional: Negative for fatigue and unexpected weight change.  Eyes: Negative for visual disturbance (chronic - followed by specialist. ).  Respiratory: Negative for cough, chest tightness and shortness of breath.   Cardiovascular: Negative for chest pain, palpitations and leg swelling.  Gastrointestinal: Negative for abdominal pain and blood in stool.  Neurological: Negative for dizziness, light-headedness and headaches.     Objective:   Vitals:   08/04/20 1448  BP: 116/78  Pulse: 96  Temp: 98.1 F (36.7 C)  TempSrc: Temporal  SpO2: 96%  Weight: 277 lb 3.2 oz (125.7 kg)  Height: '6\' 3"'  (1.905 m)     Physical Exam Vitals reviewed.  Constitutional:      Appearance: He is well-developed.  HENT:     Head:  Normocephalic and atraumatic.  Eyes:     Pupils: Pupils are equal, round, and reactive to light.  Neck:     Vascular: No carotid bruit or JVD.  Cardiovascular:     Rate and Rhythm: Normal rate and regular rhythm.     Heart sounds: Normal heart sounds. No murmur heard.   Pulmonary:     Effort: Pulmonary effort is normal.     Breath sounds: Normal breath sounds. No rales.  Skin:    General: Skin is warm and dry.  Neurological:     Mental Status: He  is alert and oriented to person, place, and time.      Results for orders placed or performed in visit on 08/04/20  POCT glycosylated hemoglobin (Hb A1C)  Result Value Ref Range   Hemoglobin A1C 8.0 (A) 4.0 - 5.6 %   HbA1c POC (<> result, manual entry)     HbA1c, POC (prediabetic range)     HbA1c, POC (controlled diabetic range)       Assessment & Plan:  David Crane is a 54 y.o. male . Type 2 diabetes mellitus with microalbuminuria, without long-term current use of insulin (HCC) - Plan: POCT glycosylated hemoglobin (Hb A1C), Microalbumin / creatinine urine ratio  Hyperlipidemia, unspecified hyperlipidemia type - Plan: Lipid panel, Comprehensive metabolic panel  Peripheral polyneuropathy  Diabetic retinopathy associated with type 2 diabetes mellitus, macular edema presence unspecified, unspecified laterality, unspecified retinopathy severity ([place])   Reviewed hcardiac work-up including nonocclusive CAD.  Aspirin and higher dose statin recommendations have been tolerated.  Unfortunately A1c is increased, will change Metformin to 1000 mg twice daily, potential side effects discussed, continue Same Dose for Now.  Continue Gabapentin Same Dose.  Continue Follow-Up with Retinopathy Specialist.  Declines Pneumovax.  Possible Intolerance to Lisinopril Previously, If Persistent Microalbuminuria Can Try ARB Low-Dose.  Low intensity exercise discussed for weight loss and to watch diet.  Recheck 3 months.   No orders of the defined types  were placed in this encounter.  Patient Instructions       If you have lab work done today you will be contacted with your lab results within the next 2 weeks.  If you have not heard from [place] then please contact us. The fastest way to get your results is to register for My Chart.   IF you received an x-ray today, you will receive an invoice from [facility] Rad[facility]. Please contact [facility] Radiology at [phone] with questions or concerns regarding your invoice.   IF you received labwork today, you will receive an invoice from [facility]. Please contact [facility] at [phone] with questions or concerns regarding your invoice.   Our billing staff will not be able to assist you with questions regarding bills from these companies.  You will be contacted with the lab results as soon as they are available. The fastest way to get your results is to activate your My Chart account. Instructions are located on the last page of this paperwork. If you have not heard from [place] regarding the results in 2 weeks, please contact this office.          Signed, [name], MD [facility]

## 2020-08-05 LAB — COMPREHENSIVE METABOLIC PANEL
ALT: 16 IU/L (ref 0–44)
AST: 16 IU/L (ref 0–40)
Albumin/Globulin Ratio: 2.3 — ABNORMAL HIGH (ref 1.2–2.2)
Albumin: 4.5 g/dL (ref 3.8–4.9)
Alkaline Phosphatase: 51 IU/L (ref 44–121)
BUN/Creatinine Ratio: 15 (ref 9–20)
BUN: 15 mg/dL (ref 6–24)
Bilirubin Total: 0.9 mg/dL (ref 0.0–1.2)
CO2: 27 mmol/L (ref 20–29)
Calcium: 9.2 mg/dL (ref 8.7–10.2)
Chloride: 100 mmol/L (ref 96–106)
Creatinine, Ser: 0.97 mg/dL (ref 0.76–1.27)
GFR calc Af Amer: 102 mL/min/{1.73_m2} (ref >59)
GFR calc non Af Amer: 88 mL/min/{1.73_m2} (ref >59)
Globulin, Total: 2 g/dL (ref 1.5–4.5)
Glucose: 131 mg/dL — ABNORMAL HIGH (ref 65–99)
Potassium: 4.6 mmol/L (ref 3.5–5.2)
Sodium: 141 mmol/L (ref 134–144)
Total Protein: 6.5 g/dL (ref 6.0–8.5)

## 2020-08-05 LAB — LIPID PANEL
Chol/HDL Ratio: 2.9 ratio (ref 0.0–5.0)
Cholesterol, Total: 94 mg/dL — ABNORMAL LOW (ref 100–199)
HDL: 32 mg/dL — ABNORMAL LOW (ref >39)
LDL Chol Calc (NIH): 36 mg/dL (ref 0–99)
Triglycerides: 149 mg/dL (ref 0–149)
VLDL Cholesterol Cal: 26 mg/dL (ref 5–40)

## 2020-08-05 LAB — MICROALBUMIN / CREATININE URINE RATIO
Creatinine, Urine: 162.1 mg/dL
Microalb/Creat Ratio: 83 mg/g creat — ABNORMAL HIGH (ref 0–29)
Microalbumin, Urine: 133.9 ug/mL

## 2020-08-29 LAB — HM DIABETES EYE EXAM

## 2020-08-31 ENCOUNTER — Other Ambulatory Visit: Payer: Self-pay | Admitting: Family Medicine

## 2020-08-31 DIAGNOSIS — G629 Polyneuropathy, unspecified: Secondary | ICD-10-CM

## 2020-08-31 MED ORDER — GABAPENTIN 300 MG PO CAPS: 300.0000 mg | ORAL_CAPSULE | Freq: Three times a day (TID) | ORAL | 1 refills | Status: DC | PRN

## 2020-08-31 NOTE — Telephone Encounter (Signed)
Pt called and stated that he is wanting a nurse to call him about his Rx listed below. Pt states he would like to know how much of this medication he can take if he is having flare ups. Please advise.  gabapentin (NEURONTIN) 300 MG capsule [[id number]]

## 2020-08-31 NOTE — Telephone Encounter (Signed)
I have called pt back to gather more information on the request.   Pt reports that he is getting a new job that will be working and on his feet all week.  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   He states his feet is hurting a lot more the more he is walking and requesting to take more of the Gabapentin on the days that he has to work, which is usually the weekdays.   Pt is currently taking gabapentin (NEURONTIN) 300 MG capsule, TAKE 1 TO 2 CAPSULES BY MOUTH IN THE MORNING AND 2 AT BEDTIME.   Pt is wanting to take 2 caps in the am, 2 tab in the afternoon and 2 nightly to start and then an additional possibility if the pain gets real bad. So he wants to go from 3-4 tabs daily (1200mg ) to about 6-7 (2100mg ) at max.    Since he says he will be taking more on the weekday, while he is working, and going back to about 4 a day on his days off I have informed him that we can possibility start with the 2 tabs TID if the provider is agreeable with the plan and go from there.   If this is appropriate please send the rx to [place], [place].  RX has been pended.

## 2020-08-31 NOTE — Telephone Encounter (Signed)
Gabapentin request noted.  With current dosage I would slowly taper up to control of symptoms.  Initially can try 2 capsules in the morning, 2 at bedtime.  If not improving with this dose, can add additional 1 pill during the middle of the day after 1 week.  If still requiring more medication, can take 2 pills up to 3 times per day.  Slowly increase for improved control and let me know how that regimen is going.  Prescription will read 1 to 2 capsules up to 3 times per day, but please discuss this plan with him.  I will send a MyChart message but make sure he has reviewed that and let me know if there are questions.

## 2020-09-26 DIAGNOSIS — E113313 Type 2 diabetes mellitus with moderate nonproliferative diabetic retinopathy with macular edema, bilateral: Secondary | ICD-10-CM | POA: Diagnosis not present

## 2020-09-26 DIAGNOSIS — E113311 Type 2 diabetes mellitus with moderate nonproliferative diabetic retinopathy with macular edema, right eye: Secondary | ICD-10-CM | POA: Diagnosis not present

## 2020-10-31 DIAGNOSIS — H35033 Hypertensive retinopathy, bilateral: Secondary | ICD-10-CM | POA: Diagnosis not present

## 2020-10-31 DIAGNOSIS — H3582 Retinal ischemia: Secondary | ICD-10-CM | POA: Diagnosis not present

## 2020-10-31 DIAGNOSIS — E113313 Type 2 diabetes mellitus with moderate nonproliferative diabetic retinopathy with macular edema, bilateral: Secondary | ICD-10-CM | POA: Diagnosis not present

## 2020-11-01 ENCOUNTER — Encounter: Payer: Self-pay | Admitting: *Deleted

## 2020-11-16 ENCOUNTER — Other Ambulatory Visit: Payer: Self-pay

## 2020-11-16 ENCOUNTER — Encounter: Payer: Self-pay | Admitting: Family Medicine

## 2020-11-16 ENCOUNTER — Ambulatory Visit (INDEPENDENT_AMBULATORY_CARE_PROVIDER_SITE_OTHER): Payer: BC Managed Care – PPO | Admitting: Family Medicine

## 2020-11-16 VITALS — BP 118/80 | HR 110 | Temp 98.7°F | Ht 75.0 in | Wt 271.0 lb

## 2020-11-16 DIAGNOSIS — R809 Proteinuria, unspecified: Secondary | ICD-10-CM

## 2020-11-16 DIAGNOSIS — E785 Hyperlipidemia, unspecified: Secondary | ICD-10-CM | POA: Diagnosis not present

## 2020-11-16 DIAGNOSIS — E1129 Type 2 diabetes mellitus with other diabetic kidney complication: Secondary | ICD-10-CM

## 2020-11-16 DIAGNOSIS — R0683 Snoring: Secondary | ICD-10-CM | POA: Diagnosis not present

## 2020-11-16 MED ORDER — GLUCOSE BLOOD VI STRP: ORAL_STRIP | 12 refills | Status: DC

## 2020-11-16 NOTE — Patient Instructions (Addendum)
  Depending on home sleep test, would recommend follow up with sleep specialist unless that is being coordinated with your company. Let me know if there are questions.   Keep a record of your blood pressures outside of the office if possible and let me know those readings over the next few weeks.  That will help decide whether or not a low-dose of angiotensin receptor blocker or a low-dose blood pressure medicine may be possible that can help protect the kidneys .   No other medication changes for now.   If you have lab work done today you will be contacted with your lab results within the next 2 weeks.  If you have not heard from [place] then please contact us. The fastest way to get your results is to register for My Chart.   IF you received an x-ray today, you will receive an invoice from [facility]. Please contact [facility] at [phone] with questions or concerns regarding your invoice.   IF you received labwork today, you will receive an invoice from [place]. Please contact [facility] at [phone] with questions or concerns regarding your invoice.   Our billing staff will not be able to assist you with questions regarding bills from these companies.  You will be contacted with the lab results as soon as they are available. The fastest way to get your results is to activate your My Chart account. Instructions are located on the last page of this paperwork. If you have not heard from [place] regarding the results in 2 weeks, please contact this office.

## 2020-11-16 NOTE — Progress Notes (Signed)
 Subjective:  Patient ID: David Crane, male    DOB: 05/13/1966  Age: 54 y.o. MRN: [id number]  CC:  Chief Complaint  Patient presents with  . Follow-up    On diabetes and hyperlipidemia. Pt reports no issues with these conditions. Pt states his BS has been better since starting his new job. Pt reports he is more active now. Pt reports no diet changes since last OV. PT is requesting us to order One Touch Verio test strips if possible.    HPI Add W Reale presents for   Possible OSA?  Had DOT physical recently - concern for possible OSA, based on neck circumference. No hx of OSA. Some snoring. No daytime somnolence.  Had send home test - 2 night test from company?   Diabetes: Complicated by peripheral neuropathy, microalbuminuria, retinopathy, followed by retina specialist.  Treated with Metformin 1000 mg twice daily, glipizide 5 mg BID, statin, and gabapentin for peripheral neuropathy.  Did not tolerate ACE inhibitor with fatigue. 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  Microalbumin: ratio 83 in 07/2020 Optho, foot exam, pneumovax:  Declines flu and pneumonia vaccine. Has not received covid booster.  Tolerating higher dose lipitor and ASA.  Weight down 6 # since last visit.   Home readings: Fasting: 70 range. No symptomatic lows.  2 hr PP: none recent - random 132. No 200's.  Exercise with work.   Lab Results  Component Value Date   HGBA1C 8.0 (A) 08/04/2020   HGBA1C 7.3 (H) 04/28/2020   HGBA1C 7.2 (H) 01/20/2020   Lab Results  Component Value Date   LDLCALC 36 08/04/2020   CREATININE 0.97 08/04/2020   Wt Readings from Last 3 Encounters:  11/16/20 271 lb (122.9 kg)  08/04/20 277 lb 3.2 oz (125.7 kg)  05/31/20 273 lb 3.2 oz (123.9 kg)       History Patient Active Problem List   Diagnosis Date Noted  . Diabetic nephropathy (HCC) 05/01/2017  . Diabetes (HCC) 05/01/2017  . Peripheral neuropathy 05/01/2017   Past  Medical History:  Diagnosis Date  . Diabetes mellitus without complication (HCC)   . GERD (gastroesophageal reflux disease)   . Hyperlipidemia   . Hypertension    Past Surgical History:  Procedure Laterality Date  . Growth removed Left   . TONSILLECTOMY    . WISDOM TOOTH EXTRACTION     No Known Allergies Prior to Admission medications   Medication Sig Start Date End Date Taking? Authorizing Provider  aspirin EC 81 MG tablet Take 1 tablet (81 mg total) by mouth daily. Swallow whole. 06/15/20  Yes O'Neal, Hudson Thomas, MD  atorvastatin (LIPITOR) 40 MG tablet Take 1 tablet (40 mg total) by mouth daily. 08/04/20  Yes Greene, Jeffrey R, MD  blood glucose meter kit and supplies Dispense based on patient and insurance preference. Check twice per day. FOR ICD-9 250.00, 250.01). 04/29/17  Yes Greene, Jeffrey R, MD  gabapentin (NEURONTIN) 300 MG capsule Take 1-2 capsules (300-600 mg total) by mouth 3 (three) times daily as needed. 08/31/20  Yes Greene, Jeffrey R, MD  glipiZIDE (GLUCOTROL) 5 MG tablet TAKE 1 TABLET BY MOUTH TWICE DAILY BEFORE A MEAL 08/04/20  Yes Greene, Jeffrey R, MD  glucose blood test strip Use as instructed 01/02/19  Yes Greene, Jeffrey R, MD  Lancets 30G MISC Check sugar twice a day before meals 01/02/19  Yes Greene, Jeffrey R, MD  metFORMIN (GLUCOPHAGE) 1000 MG tablet TAKE 1   TABLET BY MOUTH TWICE DAILY WITH A MEAL 08/04/20  Yes Wendie Agreste, MD   Social History   Socioeconomic History  . Marital status: Married    Spouse name: Not on file  . Number of children: 2  . Years of education: Not on file  . Highest education level: Not on file  Occupational History  . Not on file  Tobacco Use  . Smoking status: Never Smoker  . Smokeless tobacco: Never Used  Vaping Use  . Vaping Use: Never used  Substance and Sexual Activity  . Alcohol use: Yes    Comment: occ  . Drug use: No  . Sexual activity: Yes  Other Topics Concern  . Not on file  Social History Narrative   . Not on file   Social Determinants of Health   Financial Resource Strain: Not on file  Food Insecurity: Not on file  Transportation Needs: Not on file  Physical Activity: Not on file  Stress: Not on file  Social Connections: Not on file  Intimate Partner Violence: Not on file    Review of Systems  Constitutional: Negative for fatigue and unexpected weight change.  Eyes: Negative for visual disturbance.  Respiratory: Negative for cough, chest tightness and shortness of breath.   Cardiovascular: Negative for chest pain, palpitations and leg swelling.  Gastrointestinal: Negative for abdominal pain and blood in stool.  Neurological: Negative for dizziness, light-headedness and headaches.     Objective:   Vitals:   11/16/20 1656 11/16/20 1657  BP: (!) 144/81 118/80  Pulse: (!) 110   Temp: 98.7 F (37.1 C)   TempSrc: Temporal   SpO2: 97%   Weight: 271 lb (122.9 kg)   Height: 6' 3" (1.905 m)      Physical Exam Vitals reviewed.  Constitutional:      Appearance: He is well-developed and well-nourished.  HENT:     Head: Normocephalic and atraumatic.  Eyes:     Extraocular Movements: EOM normal.     Pupils: Pupils are equal, round, and reactive to light.  Neck:     Vascular: No carotid bruit or JVD.  Cardiovascular:     Rate and Rhythm: Normal rate and regular rhythm.     Heart sounds: Normal heart sounds. No murmur heard.   Pulmonary:     Effort: Pulmonary effort is normal.     Breath sounds: Normal breath sounds. No rales.  Musculoskeletal:        General: No edema.  Skin:    General: Skin is warm and dry.  Neurological:     Mental Status: He is alert and oriented to person, place, and time.  Psychiatric:        Mood and Affect: Mood and affect normal.        Assessment & Plan:  David Crane is a 55 y.o. male . 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  Commended on improvements.  Tolerating higher dose of Metformin, glipizide.  Check A1c.  -With microalbuminuria would consider ARB as he did not tolerate ACE but repeat blood pressure noted today.  May not tolerate low-dose ARB either.  Home monitoring of BP and recommended update by MyChart next few weeks to help with that decision.  Hyperlipidemia, unspecified hyperlipidemia type - Plan: Lipid panel, Comprehensive metabolic panel  -Tolerating higher  dose statin, continue same along with aspirin.  Snoring  -Sleep study ordered by his work.  Advised that if that is positive would recommend follow-up with sleep specialist to decide on treatment and follow up intervals.   Meds ordered this encounter  Medications  . DISCONTD: glucose blood test strip    Sig: Use as instructed    Dispense:  100 each    Refill:  12  . glucose blood test strip    Sig: Use as instructed    Dispense:  100 each    Refill:  12    One Touch Verio   Patient Instructions    Depending on home sleep test, would recommend follow up with sleep specialist unless that is being coordinated with your company. Let me know if there are questions.   Keep a record of your blood pressures outside of the office if possible and let me know those readings over the next few weeks.  That will help decide whether or not a low-dose of angiotensin receptor blocker or a low-dose blood pressure medicine may be possible that can help protect the kidneys .   No other medication changes for now.   If you have lab work done today you will be contacted with your lab results within the next 2 weeks.  If you have not heard from us then please contact us. The fastest way to get your results is to register for My Chart.   IF you received an x-ray today, you will receive an invoice from [facility]. Please contact  [facility] at [phone] with questions or concerns regarding your invoice.   IF you received labwork today, you will receive an invoice from [facility]. Please contact [facility] at [phone] with questions or concerns regarding your invoice.   Our billing staff will not be able to assist you with questions regarding bills from these companies.  You will be contacted with the lab results as soon as they are available. The fastest way to get your results is to activate your My Chart account. Instructions are located on the last page of this paperwork. If you have not heard from us regarding the results in 2 weeks, please contact this office.         Signed, Jeffrey Greene, MD [facility]  

## 2020-11-17 LAB — COMPREHENSIVE METABOLIC PANEL
ALT: 19 IU/L (ref 0–44)
AST: 24 IU/L (ref 0–40)
Albumin/Globulin Ratio: 1.9 (ref 1.2–2.2)
Albumin: 4.4 g/dL (ref 3.8–4.9)
Alkaline Phosphatase: 52 IU/L (ref 44–121)
BUN/Creatinine Ratio: 23 — ABNORMAL HIGH (ref 9–20)
BUN: 25 mg/dL — ABNORMAL HIGH (ref 6–24)
Bilirubin Total: 0.6 mg/dL (ref 0.0–1.2)
CO2: 21 mmol/L (ref 20–29)
Calcium: 9.7 mg/dL (ref 8.7–10.2)
Chloride: 100 mmol/L (ref 96–106)
Creatinine, Ser: 1.11 mg/dL (ref 0.76–1.27)
Globulin, Total: 2.3 g/dL (ref 1.5–4.5)
Glucose: 131 mg/dL — ABNORMAL HIGH (ref 65–99)
Potassium: 4.4 mmol/L (ref 3.5–5.2)
Sodium: 139 mmol/L (ref 134–144)
Total Protein: 6.7 g/dL (ref 6.0–8.5)
eGFR: 79 mL/min/{1.73_m2} (ref >59)

## 2020-11-17 LAB — LIPID PANEL
Chol/HDL Ratio: 2.6 ratio (ref 0.0–5.0)
Cholesterol, Total: 102 mg/dL (ref 100–199)
HDL: 39 mg/dL — ABNORMAL LOW (ref >39)
LDL Chol Calc (NIH): 45 mg/dL (ref 0–99)
Triglycerides: 89 mg/dL (ref 0–149)
VLDL Cholesterol Cal: 18 mg/dL (ref 5–40)

## 2020-11-17 LAB — HEMOGLOBIN A1C
Est. average glucose Bld gHb Est-mCnc: 163 mg/dL
Hgb A1c MFr Bld: 7.3 % — ABNORMAL HIGH (ref 4.8–5.6)

## 2020-11-28 DIAGNOSIS — E113313 Type 2 diabetes mellitus with moderate nonproliferative diabetic retinopathy with macular edema, bilateral: Secondary | ICD-10-CM | POA: Diagnosis not present

## 2020-11-28 DIAGNOSIS — E113311 Type 2 diabetes mellitus with moderate nonproliferative diabetic retinopathy with macular edema, right eye: Secondary | ICD-10-CM | POA: Diagnosis not present

## 2020-12-01 ENCOUNTER — Other Ambulatory Visit: Payer: Self-pay | Admitting: Cardiovascular Disease

## 2020-12-01 DIAGNOSIS — E785 Hyperlipidemia, unspecified: Secondary | ICD-10-CM

## 2020-12-01 DIAGNOSIS — E1129 Type 2 diabetes mellitus with other diabetic kidney complication: Secondary | ICD-10-CM

## 2020-12-01 DIAGNOSIS — R809 Proteinuria, unspecified: Secondary | ICD-10-CM

## 2020-12-29 DIAGNOSIS — H3582 Retinal ischemia: Secondary | ICD-10-CM | POA: Diagnosis not present

## 2020-12-29 DIAGNOSIS — H35033 Hypertensive retinopathy, bilateral: Secondary | ICD-10-CM | POA: Diagnosis not present

## 2020-12-29 DIAGNOSIS — E113313 Type 2 diabetes mellitus with moderate nonproliferative diabetic retinopathy with macular edema, bilateral: Secondary | ICD-10-CM | POA: Diagnosis not present

## 2021-01-25 ENCOUNTER — Other Ambulatory Visit: Payer: Self-pay | Admitting: Family Medicine

## 2021-01-25 DIAGNOSIS — R809 Proteinuria, unspecified: Secondary | ICD-10-CM

## 2021-01-25 DIAGNOSIS — E1129 Type 2 diabetes mellitus with other diabetic kidney complication: Secondary | ICD-10-CM

## 2021-01-26 DIAGNOSIS — E113313 Type 2 diabetes mellitus with moderate nonproliferative diabetic retinopathy with macular edema, bilateral: Secondary | ICD-10-CM | POA: Diagnosis not present

## 2021-01-26 DIAGNOSIS — E113311 Type 2 diabetes mellitus with moderate nonproliferative diabetic retinopathy with macular edema, right eye: Secondary | ICD-10-CM | POA: Diagnosis not present

## 2021-01-26 DIAGNOSIS — H43821 Vitreomacular adhesion, right eye: Secondary | ICD-10-CM | POA: Diagnosis not present

## 2021-02-22 ENCOUNTER — Ambulatory Visit (INDEPENDENT_AMBULATORY_CARE_PROVIDER_SITE_OTHER): Payer: BC Managed Care – PPO | Admitting: Family Medicine

## 2021-02-22 ENCOUNTER — Other Ambulatory Visit: Payer: Self-pay

## 2021-02-22 ENCOUNTER — Encounter: Payer: Self-pay | Admitting: Family Medicine

## 2021-02-22 DIAGNOSIS — R809 Proteinuria, unspecified: Secondary | ICD-10-CM

## 2021-02-22 DIAGNOSIS — G629 Polyneuropathy, unspecified: Secondary | ICD-10-CM | POA: Diagnosis not present

## 2021-02-22 DIAGNOSIS — E1129 Type 2 diabetes mellitus with other diabetic kidney complication: Secondary | ICD-10-CM

## 2021-02-22 LAB — POCT GLYCOSYLATED HEMOGLOBIN (HGB A1C): Hemoglobin A1C: 6.9 % — AB (ref 4.0–5.6)

## 2021-02-22 MED ORDER — METFORMIN HCL 1000 MG PO TABS: 1.0000 | ORAL_TABLET | Freq: Two times a day (BID) | ORAL | 0 refills | Status: DC

## 2021-02-22 NOTE — Patient Instructions (Addendum)
Good news on A1c today. Try to minimize nighttime snacks. No change in meds today.  recheck in 3 months.  Let me know if there are questions sooner.

## 2021-02-22 NOTE — Progress Notes (Signed)
Subjective:  Patient ID: David Crane, male    DOB: 1966/04/23  Age: 55 y.o. MRN: [id number]  CC:  Chief Complaint  Patient presents with  . Diabetes    Pt doing well today pt notes does not need refill metformin as has a back supply of this   . Hyperlipidemia    Pt in need of refill atorvastatin     HPI David Crane presents for   Diabetes: Complicated by peripheral neuropathy, microalbuminuria, retinopathy followed by retina specialist.  Treated with metformin 1000 mg twice daily, glipizide 5 mg twice daily, he is on statin as well as gabapentin for peripheral neuropathy.  Did not tolerate ACE inhibitor due to fatigue.  Was tolerating higher dose Lipitor as well as aspirin last visit.  Weight was improved at that time and commended on  improved diabetes control.  Unfortunately laid off end of April. Less active with work, but some activity at home. interviewed today for a new job. Some nighttime snacks.  Trying to watch diet.  Fasting: 146 yesterday, in 140's in morning.  4 hr postprandial 114.  No symptomatic lows.   Microalbumin: ratio 83 on 08/04/20  Optho, foot exam, pneumovax:   Declines covid booster, declines HIV, Hep C screening. Declines pneumonia vaccine.  Considering shingles vaccine.     Lab Results  Component Value Date   HGBA1C 6.9 (A) 02/22/2021   HGBA1C 7.3 (H) 11/16/2020   HGBA1C 8.0 (A) 08/04/2020   Lab Results  Component Value Date   LDLCALC 45 11/16/2020   CREATININE 1.11 11/16/2020    History Patient Active Problem List   Diagnosis Date Noted  . Diabetic nephropathy ([place]) 05/01/2017  . Diabetes ([place]) 05/01/2017  . Peripheral neuropathy 05/01/2017   Past Medical History:  Diagnosis Date  . Diabetes mellitus without complication ([place])   . GERD (gastroesophageal reflux disease)   . Hyperlipidemia   . Hypertension    Past Surgical History:  Procedure Laterality Date  . Growth removed Left   . TONSILLECTOMY    . WISDOM TOOTH  EXTRACTION     No Known Allergies Prior to Admission medications   Medication Sig Start Date End Date Taking? Authorizing Provider  aspirin EC 81 MG tablet Take 1 tablet (81 mg total) by mouth daily. Swallow whole. 06/15/20  Yes O'Neal, Cassie Freer, MD  atorvastatin (LIPITOR) 40 MG tablet Take 1 tablet by mouth once daily 12/02/20  Yes O'Neal, Cassie Freer, MD  blood glucose meter kit and supplies Dispense based on patient and insurance preference. Check twice per day. FOR ICD-9 250.00, 250.01). 04/29/17  Yes Wendie Agreste, MD  gabapentin (NEURONTIN) 300 MG capsule Take 1-2 capsules (300-600 mg total) by mouth 3 (three) times daily as needed. 08/31/20  Yes Wendie Agreste, MD  glipiZIDE (GLUCOTROL) 5 MG tablet TAKE 1 TABLET BY MOUTH TWICE DAILY BEFORE A MEAL 08/04/20  Yes Wendie Agreste, MD  glucose blood test strip Use as instructed 11/16/20  Yes Wendie Agreste, MD  Lancets 30G MISC Check sugar twice a day before meals 01/02/19  Yes Wendie Agreste, MD  metFORMIN (GLUCOPHAGE) 1000 MG tablet TAKE 1 TABLET BY MOUTH TWICE DAILY WITH A MEAL 01/26/21  Yes Wendie Agreste, MD   Social History   Socioeconomic History  . Marital status: Married    Spouse name: Not on file  . Number of children: 2  . Years of education: Not on file  . Highest education level: Not on file  Occupational History  . Not on file  Tobacco Use  . Smoking status: Never Smoker  . Smokeless tobacco: Never Used  Vaping Use  . Vaping Use: Never used  Substance and Sexual Activity  . Alcohol use: Yes    Comment: occ  . Drug use: No  . Sexual activity: Yes  Other Topics Concern  . Not on file  Social History Narrative  . Not on file   Social Determinants of Health   Financial Resource Strain: Not on file  Food Insecurity: Not on file  Transportation Needs: Not on file  Physical Activity: Not on file  Stress: Not on file  Social Connections: Not on file  Intimate Partner Violence: Not on file     Review of Systems  Constitutional: Negative for fatigue and unexpected weight change.  Eyes: Negative for visual disturbance.  Respiratory: Negative for cough, chest tightness and shortness of breath.   Cardiovascular: Negative for chest pain, palpitations and leg swelling.  Gastrointestinal: Negative for abdominal pain and blood in stool.  Neurological: Negative for dizziness, light-headedness and headaches.     Objective:   Vitals:   02/22/21 1646  BP: 116/64  Pulse: 91  Resp: 16  Temp: 98.2 F (36.8 C)  TempSrc: Temporal  SpO2: 98%  Weight: 272 lb 9.6 oz (123.7 kg)  Height: '6\' 3"'  (1.905 m)     Physical Exam Vitals reviewed.  Constitutional:      Appearance: He is well-developed.  HENT:     Head: Normocephalic and atraumatic.  Eyes:     Pupils: Pupils are equal, round, and reactive to light.  Neck:     Vascular: No carotid bruit or JVD.  Cardiovascular:     Rate and Rhythm: Normal rate and regular rhythm.     Heart sounds: Normal heart sounds. No murmur heard.   Pulmonary:     Effort: Pulmonary effort is normal.     Breath sounds: Normal breath sounds. No rales.  Skin:    General: Skin is warm and dry.  Neurological:     Mental Status: He is alert and oriented to person, place, and time.     Results for orders placed or performed in visit on 02/22/21  POCT glycosylated hemoglobin (Hb A1C)  Result Value Ref Range   Hemoglobin A1C 6.9 (A) 4.0 - 5.6 %   HbA1c POC (<> result, manual entry)     HbA1c, POC (prediabetic range)     HbA1c, POC (controlled diabetic range)      Assessment & Plan:  David Crane is a 55 y.o. male . Type 2 diabetes mellitus with microalbuminuria, without long-term current use of insulin (HCC) - Plan: metFORMIN (GLUCOPHAGE) 1000 MG tablet, POCT glycosylated hemoglobin (Hb A1C)  Peripheral polyneuropathy  Improved control. Continue same regimen for now with decrease in late night eating. Recheck in 74month.   Meds  ordered this encounter  Medications  . metFORMIN (GLUCOPHAGE) 1000 MG tablet    Sig: Take 1 tablet (1,000 mg total) by mouth 2 (two) times daily with a meal.    Dispense:  180 tablet    Refill:  0   Patient Instructions  Good news on A1c today. Try to minimize nighttime snacks. No change in meds today.  recheck in 3 months.  Let me know if there are questions sooner.      Signed, JMerri Ray MD [facility]

## 2021-02-27 DIAGNOSIS — E113313 Type 2 diabetes mellitus with moderate nonproliferative diabetic retinopathy with macular edema, bilateral: Secondary | ICD-10-CM | POA: Diagnosis not present

## 2021-03-27 DIAGNOSIS — E113313 Type 2 diabetes mellitus with moderate nonproliferative diabetic retinopathy with macular edema, bilateral: Secondary | ICD-10-CM | POA: Diagnosis not present

## 2021-03-27 DIAGNOSIS — E113311 Type 2 diabetes mellitus with moderate nonproliferative diabetic retinopathy with macular edema, right eye: Secondary | ICD-10-CM | POA: Diagnosis not present

## 2021-04-03 DIAGNOSIS — E119 Type 2 diabetes mellitus without complications: Secondary | ICD-10-CM | POA: Diagnosis not present

## 2021-04-15 ENCOUNTER — Other Ambulatory Visit: Payer: Self-pay | Admitting: Family Medicine

## 2021-04-15 DIAGNOSIS — R809 Proteinuria, unspecified: Secondary | ICD-10-CM

## 2021-04-15 DIAGNOSIS — E1129 Type 2 diabetes mellitus with other diabetic kidney complication: Secondary | ICD-10-CM

## 2021-04-24 DIAGNOSIS — H3582 Retinal ischemia: Secondary | ICD-10-CM | POA: Diagnosis not present

## 2021-04-24 DIAGNOSIS — H35033 Hypertensive retinopathy, bilateral: Secondary | ICD-10-CM | POA: Diagnosis not present

## 2021-04-24 DIAGNOSIS — E113313 Type 2 diabetes mellitus with moderate nonproliferative diabetic retinopathy with macular edema, bilateral: Secondary | ICD-10-CM | POA: Diagnosis not present

## 2021-05-25 DIAGNOSIS — E113313 Type 2 diabetes mellitus with moderate nonproliferative diabetic retinopathy with macular edema, bilateral: Secondary | ICD-10-CM | POA: Diagnosis not present

## 2021-05-25 DIAGNOSIS — E113311 Type 2 diabetes mellitus with moderate nonproliferative diabetic retinopathy with macular edema, right eye: Secondary | ICD-10-CM | POA: Diagnosis not present

## 2021-06-01 ENCOUNTER — Ambulatory Visit (INDEPENDENT_AMBULATORY_CARE_PROVIDER_SITE_OTHER): Payer: BC Managed Care – PPO | Admitting: Family Medicine

## 2021-06-01 ENCOUNTER — Other Ambulatory Visit: Payer: Self-pay

## 2021-06-01 ENCOUNTER — Encounter: Payer: Self-pay | Admitting: Family Medicine

## 2021-06-01 VITALS — BP 118/78 | HR 87 | Temp 98.2°F | Resp 17 | Ht 75.0 in | Wt 273.6 lb

## 2021-06-01 DIAGNOSIS — N489 Disorder of penis, unspecified: Secondary | ICD-10-CM

## 2021-06-01 DIAGNOSIS — R809 Proteinuria, unspecified: Secondary | ICD-10-CM

## 2021-06-01 DIAGNOSIS — E1129 Type 2 diabetes mellitus with other diabetic kidney complication: Secondary | ICD-10-CM | POA: Diagnosis not present

## 2021-06-01 DIAGNOSIS — G629 Polyneuropathy, unspecified: Secondary | ICD-10-CM

## 2021-06-01 DIAGNOSIS — E785 Hyperlipidemia, unspecified: Secondary | ICD-10-CM

## 2021-06-01 DIAGNOSIS — K130 Diseases of lips: Secondary | ICD-10-CM | POA: Diagnosis not present

## 2021-06-01 MED ORDER — GLIPIZIDE 5 MG PO TABS: 5.0000 mg | ORAL_TABLET | Freq: Two times a day (BID) | ORAL | 0 refills | Status: DC

## 2021-06-01 MED ORDER — METFORMIN HCL 1000 MG PO TABS: 1000.0000 mg | ORAL_TABLET | Freq: Two times a day (BID) | ORAL | 0 refills | Status: DC

## 2021-06-01 NOTE — Patient Instructions (Signed)
I will refer you to dermatology and urology. No med changes for now.

## 2021-06-01 NOTE — Progress Notes (Signed)
Subjective:  Patient ID: David Crane, male    DOB: [birth year]  Age: 55 y.o. MRN: [id number]  CC:  Chief Complaint  Patient presents with   Diabetes    Pt here for follow up on dm pt denies concerns    Mass    Pt reports a bump on his upper lip, almost goes away then re-flares, pre reports present for 1 year no change,     HPI David Crane presents for  Diabetes: Complicated by peripheral neuropathy, microalbuminuria, retinopathy that is followed by retina specialist.  Treated with metformin 1000 mg twice daily, glipizide 5 mg twice daily, he is on statin as well as gabapentin for peripheral neuropathy.  Did not tolerate ACE inhibitor due to fatigue.  A1c well controlled in June.  Planned on cutting back on late-night eating at his last visit.  Neuropathy doing well - rare midafternoon dose. Usually 1 in am, 2 at night.  Home readings: Fasting 129 Postprandial 90 3hrs after eating yesterday.  No symptomatic lows Microalbumin: Ratio 83 on 08/04/2020. Optho, foot exam, pneumovax:  Up to date.  Declined shingrix today. May consider as nurse visit.  Flu vaccine - declines.   Lab Results  Component Value Date   HGBA1C 6.9 (A) 02/22/2021   HGBA1C 7.3 (H) 11/16/2020   HGBA1C 8.0 (A) 08/04/2020   Lab Results  Component Value Date   LDLCALC 45 11/16/2020   CREATININE 1.11 11/16/2020    Bump on upper lip Past year or more. Healing, scabs over then comes back treats with blistex recurrent every week or two.  Does not disappear No pain. Prior hx of cold sores - felt different.  No hx of skin CA.  On front of lip - mid lip.   Wants a referral to urology. Feels like penis has a hardened area on top - past few years, no change in size of area, penis size appears to be decreasing. Less tumescence with erections. No dysuria. No change shape of penis with erection or pain. No preceding injury.   History Patient Active Problem List   Diagnosis Date Noted   Diabetic  nephropathy ([place]) 05/01/2017   Diabetes ([place]) 05/01/2017   Peripheral neuropathy 05/01/2017   Past Medical History:  Diagnosis Date   Diabetes mellitus without complication ([place])    GERD (gastroesophageal reflux disease)    Hyperlipidemia    Hypertension    Past Surgical History:  Procedure Laterality Date   Growth removed Left    TONSILLECTOMY     WISDOM TOOTH EXTRACTION     No Known Allergies Prior to Admission medications   Medication Sig Start Date End Date Taking? Authorizing Provider  aspirin EC 81 MG tablet Take 1 tablet (81 mg total) by mouth daily. Swallow whole. 06/15/20  Yes O'Neal, Cassie Freer, MD  atorvastatin (LIPITOR) 40 MG tablet Take 1 tablet by mouth once daily 12/02/20  Yes O'Neal, Cassie Freer, MD  blood glucose meter kit and supplies Dispense based on patient and insurance preference. Check twice per day. FOR ICD-9 250.00, 250.01). 04/29/17  Yes Wendie Agreste, MD  gabapentin (NEURONTIN) 300 MG capsule Take 1-2 capsules (300-600 mg total) by mouth 3 (three) times daily as needed. 08/31/20  Yes Wendie Agreste, MD  glipiZIDE (GLUCOTROL) 5 MG tablet TAKE 1 TABLET BY MOUTH TWICE DAILY BEFORE A MEAL 04/17/21  Yes Wendie Agreste, MD  glucose blood test strip Use as instructed 11/16/20  Yes Wendie Agreste, MD  Lancets 30G  MISC Check sugar twice a day before meals 01/02/19  Yes Wendie Agreste, MD  metFORMIN (GLUCOPHAGE) 1000 MG tablet Take 1 tablet (1,000 mg total) by mouth 2 (two) times daily with a meal. 02/22/21  Yes Wendie Agreste, MD   Social History   Socioeconomic History   Marital status: Married    Spouse name: Not on file   Number of children: 2   Years of education: Not on file   Highest education level: Not on file  Occupational History   Not on file  Tobacco Use   Smoking status: Never   Smokeless tobacco: Never  Vaping Use   Vaping Use: Never used  Substance and Sexual Activity   Alcohol use: Yes    Comment: occ   Drug use: No    Sexual activity: Yes  Other Topics Concern   Not on file  Social History Narrative   Not on file   Social Determinants of Health   Financial Resource Strain: Not on file  Food Insecurity: Not on file  Transportation Needs: Not on file  Physical Activity: Not on file  Stress: Not on file  Social Connections: Not on file  Intimate Partner Violence: Not on file    Review of Systems  Constitutional:  Negative for fatigue and unexpected weight change.  Eyes:  Negative for visual disturbance.  Respiratory:  Negative for cough, chest tightness and shortness of breath.   Cardiovascular:  Negative for chest pain, palpitations and leg swelling.  Gastrointestinal:  Negative for abdominal pain and blood in stool.  Neurological:  Negative for dizziness, light-headedness and headaches.    Objective:   Vitals:   06/01/21 1545  BP: 118/78  Pulse: 87  Resp: 17  Temp: 98.2 F (36.8 C)  TempSrc: Temporal  SpO2: 94%  Weight: 273 lb 9.6 oz (124.1 kg)  Height: '6\' 3"'  (1.905 m)     Physical Exam Vitals reviewed.  Constitutional:      Appearance: He is well-developed.  HENT:     Head: Normocephalic and atraumatic.  Neck:     Vascular: No carotid bruit or JVD.  Cardiovascular:     Rate and Rhythm: Normal rate and regular rhythm.     Heart sounds: Normal heart sounds. No murmur heard. Pulmonary:     Effort: Pulmonary effort is normal.     Breath sounds: Normal breath sounds. No rales.  Genitourinary:   Musculoskeletal:     Right lower leg: No edema.     Left lower leg: No edema.  Skin:    General: Skin is warm and dry.     Comments: See photo of upper lip on left ulcerated appearing lesion that crosses vermilion border on upper aspect of left upper lip, to mid lip.   Neurological:     Mental Status: He is alert and oriented to person, place, and time.  Psychiatric:        Mood and Affect: Mood normal.       Assessment & Plan:  David Crane is a 55 y.o. male  . Lesion of lip - Plan: Ambulatory referral to Dermatology  -Concerning for possible basal versus squamous cell with persistent nonhealing area.  Refer to dermatology  Type 2 diabetes mellitus with microalbuminuria, without long-term current use of insulin (HCC) - Plan: Comprehensive metabolic panel, Hemoglobin A1c, metFORMIN (GLUCOPHAGE) 1000 MG tablet, glipiZIDE (GLUCOTROL) 5 MG tablet  -Tolerating current regimen with improved home readings, no symptomatic hypoglycemia, no changes for now, check labs.  Penile lesion - Plan: Ambulatory referral to Urology  -Question scar tissue versus mass with firm area proximal penile shaft.  Refer to urology.   Peripheral polyneuropathy  -Stable with usual 3 gabapentin per day with occasional need for fourth dose.  Will advise me when refill needed, can adjust quantity if needed.  Hyperlipidemia, unspecified hyperlipidemia type - Plan: Lipid panel  -Tolerating current statin, check labs.  Meds ordered this encounter  Medications   metFORMIN (GLUCOPHAGE) 1000 MG tablet    Sig: Take 1 tablet (1,000 mg total) by mouth 2 (two) times daily with a meal.    Dispense:  180 tablet    Refill:  0   glipiZIDE (GLUCOTROL) 5 MG tablet    Sig: Take 1 tablet (5 mg total) by mouth 2 (two) times daily before a meal.    Dispense:  180 tablet    Refill:  0   Patient Instructions  I will refer you to dermatology and urology. No med changes for now.     Signed,   [name], MD [place] 06/01/21 6:08 PM

## 2021-06-02 LAB — COMPREHENSIVE METABOLIC PANEL
ALT: 16 U/L (ref 0–53)
AST: 18 U/L (ref 0–37)
Albumin: 4.4 g/dL (ref 3.5–5.2)
Alkaline Phosphatase: 45 U/L (ref 39–117)
BUN: 25 mg/dL — ABNORMAL HIGH (ref 6–23)
CO2: 26 mEq/L (ref 19–32)
Calcium: 9.4 mg/dL (ref 8.4–10.5)
Chloride: 103 mEq/L (ref 96–112)
Creatinine, Ser: 1.17 mg/dL (ref 0.40–1.50)
GFR: 70.27 mL/min (ref >60.00)
Glucose, Bld: 90 mg/dL (ref 70–99)
Potassium: 4.1 mEq/L (ref 3.5–5.1)
Sodium: 140 mEq/L (ref 135–145)
Total Bilirubin: 1 mg/dL (ref 0.2–1.2)
Total Protein: 7 g/dL (ref 6.0–8.3)

## 2021-06-02 LAB — LIPID PANEL
Cholesterol: 100 mg/dL (ref 0–200)
HDL: 37.3 mg/dL — ABNORMAL LOW (ref >39.00)
LDL Cholesterol: 39 mg/dL (ref 0–99)
NonHDL: 62.45
Total CHOL/HDL Ratio: 3
Triglycerides: 117 mg/dL (ref 0.0–149.0)
VLDL: 23.4 mg/dL (ref 0.0–40.0)

## 2021-06-02 LAB — HEMOGLOBIN A1C: Hgb A1c MFr Bld: 7.6 % — ABNORMAL HIGH (ref 4.6–6.5)

## 2021-06-07 ENCOUNTER — Ambulatory Visit: Payer: BC Managed Care – PPO | Admitting: Family Medicine

## 2021-06-22 DIAGNOSIS — E113313 Type 2 diabetes mellitus with moderate nonproliferative diabetic retinopathy with macular edema, bilateral: Secondary | ICD-10-CM | POA: Diagnosis not present

## 2021-06-22 DIAGNOSIS — H35033 Hypertensive retinopathy, bilateral: Secondary | ICD-10-CM | POA: Diagnosis not present

## 2021-06-22 DIAGNOSIS — H43821 Vitreomacular adhesion, right eye: Secondary | ICD-10-CM | POA: Diagnosis not present

## 2021-06-22 DIAGNOSIS — H3582 Retinal ischemia: Secondary | ICD-10-CM | POA: Diagnosis not present

## 2021-07-10 DIAGNOSIS — N5202 Corporo-venous occlusive erectile dysfunction: Secondary | ICD-10-CM | POA: Diagnosis not present

## 2021-07-10 DIAGNOSIS — N486 Induration penis plastica: Secondary | ICD-10-CM | POA: Diagnosis not present

## 2021-07-20 DIAGNOSIS — E113313 Type 2 diabetes mellitus with moderate nonproliferative diabetic retinopathy with macular edema, bilateral: Secondary | ICD-10-CM | POA: Diagnosis not present

## 2021-07-20 DIAGNOSIS — E113311 Type 2 diabetes mellitus with moderate nonproliferative diabetic retinopathy with macular edema, right eye: Secondary | ICD-10-CM | POA: Diagnosis not present

## 2021-07-25 DIAGNOSIS — D3701 Neoplasm of uncertain behavior of lip: Secondary | ICD-10-CM | POA: Diagnosis not present

## 2021-07-25 DIAGNOSIS — C4401 Basal cell carcinoma of skin of lip: Secondary | ICD-10-CM | POA: Diagnosis not present

## 2021-08-24 ENCOUNTER — Other Ambulatory Visit: Payer: Self-pay | Admitting: Cardiovascular Disease

## 2021-08-24 DIAGNOSIS — E113313 Type 2 diabetes mellitus with moderate nonproliferative diabetic retinopathy with macular edema, bilateral: Secondary | ICD-10-CM | POA: Diagnosis not present

## 2021-08-24 DIAGNOSIS — E1129 Type 2 diabetes mellitus with other diabetic kidney complication: Secondary | ICD-10-CM

## 2021-08-24 DIAGNOSIS — H43821 Vitreomacular adhesion, right eye: Secondary | ICD-10-CM | POA: Diagnosis not present

## 2021-08-24 DIAGNOSIS — E785 Hyperlipidemia, unspecified: Secondary | ICD-10-CM

## 2021-09-06 ENCOUNTER — Ambulatory Visit (INDEPENDENT_AMBULATORY_CARE_PROVIDER_SITE_OTHER): Payer: BC Managed Care – PPO | Admitting: Family Medicine

## 2021-09-06 ENCOUNTER — Encounter: Payer: Self-pay | Admitting: Family Medicine

## 2021-09-06 VITALS — BP 122/76 | HR 100 | Temp 98.3°F | Ht 75.0 in | Wt 270.2 lb

## 2021-09-06 DIAGNOSIS — R809 Proteinuria, unspecified: Secondary | ICD-10-CM

## 2021-09-06 DIAGNOSIS — E1129 Type 2 diabetes mellitus with other diabetic kidney complication: Secondary | ICD-10-CM

## 2021-09-06 LAB — POCT GLYCOSYLATED HEMOGLOBIN (HGB A1C): HbA1c POC (<> result, manual entry): 6.8 % (ref 4.0–5.6)

## 2021-09-06 NOTE — Progress Notes (Signed)
Subjective:  Patient ID: David Crane, male    DOB: [birth year]  Age: 55 y.o. MRN: [id number]  CC:  Chief Complaint  Patient presents with   Follow-up    3 month follow up on diabetes, no concerns.     HPI David Crane presents for   Diabetes: Complicated by peripheral neuropathy, microalbuminuria, retinopathy that is followed by retinal specialist.  Treated with metformin 1000 mg twice daily, glipizide 5 mg twice daily.  He is on statin as well as gabapentin.  Did not tolerate ACE inhibitor with fatigue. A1c had increased last visit although in office glucose was stable.  Recommended watching diet including late-night snacks, portion control and increased exercise with walking. Gabapentin was treating neuropathy symptoms last visit with 1 in the morning, 2 at night. Rare afternoon dose needed with new shoes. Home readings fasting: 91, no symptomatic lows.  Home readings postprandial:none.  Active - about same as last visit. No diet changes.  Same job since June. Active at work.  Basal cell CA of upper lip - plan on excision tomorrow.  Microalbumin: Ratio 83 November 2021 Flu vaccine: declines.    Lab Results  Component Value Date   HGBA1C 6.8 09/06/2021   HGBA1C 7.6 (H) 06/01/2021   HGBA1C 6.9 (A) 02/22/2021   Lab Results  Component Value Date   LDLCALC 39 06/01/2021   CREATININE 1.17 06/01/2021    History Patient Active Problem List   Diagnosis Date Noted   Diabetic nephropathy ([place]) 05/01/2017   Diabetes ([place]) 05/01/2017   Peripheral neuropathy 05/01/2017   Past Medical History:  Diagnosis Date   Diabetes mellitus without complication ([place])    GERD (gastroesophageal reflux disease)    Hyperlipidemia    Hypertension    Past Surgical History:  Procedure Laterality Date   Growth removed Left    TONSILLECTOMY     WISDOM TOOTH EXTRACTION     No Known Allergies Prior to Admission medications   Medication Sig Start Date End Date Taking? Authorizing  Provider  aspirin EC 81 MG tablet Take 1 tablet (81 mg total) by mouth daily. Swallow whole. 06/15/20  Yes O'Neal, Cassie Freer, MD  atorvastatin (LIPITOR) 40 MG tablet Take 1 tablet by mouth once daily 08/25/21  Yes O'Neal, Cassie Freer, MD  blood glucose meter kit and supplies Dispense based on patient and insurance preference. Check twice per day. FOR ICD-9 250.00, 250.01). 04/29/17  Yes Wendie Agreste, MD  gabapentin (NEURONTIN) 300 MG capsule Take 1-2 capsules (300-600 mg total) by mouth 3 (three) times daily as needed. 08/31/20  Yes Wendie Agreste, MD  glipiZIDE (GLUCOTROL) 5 MG tablet Take 1 tablet (5 mg total) by mouth 2 (two) times daily before a meal. 06/01/21  Yes Wendie Agreste, MD  glucose blood test strip Use as instructed 11/16/20  Yes Wendie Agreste, MD  Lancets 30G MISC Check sugar twice a day before meals 01/02/19  Yes Wendie Agreste, MD  metFORMIN (GLUCOPHAGE) 1000 MG tablet Take 1 tablet (1,000 mg total) by mouth 2 (two) times daily with a meal. 06/01/21  Yes Wendie Agreste, MD  tadalafil (CIALIS) 20 MG tablet Take 20 mg by mouth daily as needed. 07/22/21   [provider]   Social History   Socioeconomic History   Marital status: Married    Spouse name: Not on file   Number of children: 2   Years of education: Not on file   Highest education level: Not on  file  Occupational History   Not on file  Tobacco Use   Smoking status: Never   Smokeless tobacco: Never  Vaping Use   Vaping Use: Never used  Substance and Sexual Activity   Alcohol use: Yes    Comment: occ   Drug use: No   Sexual activity: Yes  Other Topics Concern   Not on file  Social History Narrative   Not on file   Social Determinants of Health   Financial Resource Strain: Not on file  Food Insecurity: Not on file  Transportation Needs: Not on file  Physical Activity: Not on file  Stress: Not on file  Social Connections: Not on file  Intimate Partner Violence: Not on file     Review of Systems  Constitutional:  Negative for fatigue and unexpected weight change.  Eyes:  Negative for visual disturbance (vision improved with treatments.).  Respiratory:  Negative for cough, chest tightness and shortness of breath.   Cardiovascular:  Negative for chest pain, palpitations and leg swelling.  Gastrointestinal:  Negative for abdominal pain and blood in stool.  Neurological:  Negative for dizziness, light-headedness and headaches.    Objective:   Vitals:   09/06/21 1551  BP: 122/76  Pulse: 100  Temp: 98.3 F (36.8 C)  TempSrc: Temporal  SpO2: 96%  Weight: 270 lb 3.2 oz (122.6 kg)  Height: 6' 3" (1.905 m)    Physical Exam Vitals reviewed.  Constitutional:      Appearance: He is well-developed.  HENT:     Head: Normocephalic and atraumatic.  Neck:     Vascular: No carotid bruit or JVD.  Cardiovascular:     Rate and Rhythm: Normal rate and regular rhythm.     Heart sounds: Normal heart sounds. No murmur heard. Pulmonary:     Effort: Pulmonary effort is normal.     Breath sounds: Normal breath sounds. No rales.  Musculoskeletal:     Right lower leg: No edema.     Left lower leg: No edema.  Skin:    General: Skin is warm and dry.  Neurological:     Mental Status: He is alert and oriented to person, place, and time.  Psychiatric:        Mood and Affect: Mood normal.   Results for orders placed or performed in visit on 09/06/21  POCT glycosylated hemoglobin (Hb A1C)  Result Value Ref Range   Hemoglobin A1C     HbA1c POC (<> result, manual entry) 6.8 4.0 - 5.6 %   HbA1c, POC (prediabetic range)     HbA1c, POC (controlled diabetic range)         Assessment & Plan:  David Crane is a 55 y.o. male . Type 2 diabetes mellitus with microalbuminuria, without long-term current use of insulin (HCC) - Plan: POCT glycosylated hemoglobin (Hb A1C)   No orders of the defined types were placed in this encounter.  Patient Instructions  Continue  to stay active and watch diet. If A1c is stable next visit - may be able to change to 6 month visits.      Signed,   [name], MD [place], [facility] 09/06/21 5:10 PM

## 2021-09-06 NOTE — Patient Instructions (Addendum)
Continue to stay active and watch diet. If A1c is stable next visit - may be able to change to 6 month visits.

## 2021-09-07 DIAGNOSIS — Z481 Encounter for planned postprocedural wound closure: Secondary | ICD-10-CM | POA: Diagnosis not present

## 2021-09-07 DIAGNOSIS — C4491 Basal cell carcinoma of skin, unspecified: Secondary | ICD-10-CM | POA: Diagnosis not present

## 2021-09-07 DIAGNOSIS — C4401 Basal cell carcinoma of skin of lip: Secondary | ICD-10-CM | POA: Diagnosis not present

## 2021-09-12 ENCOUNTER — Telehealth: Payer: Self-pay | Admitting: Family Medicine

## 2021-09-12 ENCOUNTER — Other Ambulatory Visit: Payer: Self-pay

## 2021-09-12 DIAGNOSIS — G629 Polyneuropathy, unspecified: Secondary | ICD-10-CM

## 2021-09-12 MED ORDER — GABAPENTIN 300 MG PO CAPS: 300.0000 mg | ORAL_CAPSULE | Freq: Three times a day (TID) | ORAL | 1 refills | Status: DC | PRN

## 2021-09-12 NOTE — Telephone Encounter (Signed)
Rx sent 

## 2021-09-12 NOTE — Telephone Encounter (Signed)
Pt called in asking for a refill on the Gabepentin, pt uses [facility] on [address]   Last appt was 09/06/21   Please advise

## 2021-09-21 DIAGNOSIS — E113313 Type 2 diabetes mellitus with moderate nonproliferative diabetic retinopathy with macular edema, bilateral: Secondary | ICD-10-CM | POA: Diagnosis not present

## 2021-09-21 DIAGNOSIS — E113311 Type 2 diabetes mellitus with moderate nonproliferative diabetic retinopathy with macular edema, right eye: Secondary | ICD-10-CM | POA: Diagnosis not present

## 2021-09-30 ENCOUNTER — Other Ambulatory Visit: Payer: Self-pay | Admitting: Family Medicine

## 2021-09-30 DIAGNOSIS — R809 Proteinuria, unspecified: Secondary | ICD-10-CM

## 2021-09-30 DIAGNOSIS — E1129 Type 2 diabetes mellitus with other diabetic kidney complication: Secondary | ICD-10-CM

## 2021-10-19 DIAGNOSIS — H3582 Retinal ischemia: Secondary | ICD-10-CM | POA: Diagnosis not present

## 2021-10-19 DIAGNOSIS — E113313 Type 2 diabetes mellitus with moderate nonproliferative diabetic retinopathy with macular edema, bilateral: Secondary | ICD-10-CM | POA: Diagnosis not present

## 2021-10-19 DIAGNOSIS — H43821 Vitreomacular adhesion, right eye: Secondary | ICD-10-CM | POA: Diagnosis not present

## 2021-10-19 DIAGNOSIS — H35033 Hypertensive retinopathy, bilateral: Secondary | ICD-10-CM | POA: Diagnosis not present

## 2021-10-29 ENCOUNTER — Other Ambulatory Visit: Payer: Self-pay | Admitting: Family Medicine

## 2021-10-29 DIAGNOSIS — E1129 Type 2 diabetes mellitus with other diabetic kidney complication: Secondary | ICD-10-CM

## 2021-11-30 DIAGNOSIS — E113313 Type 2 diabetes mellitus with moderate nonproliferative diabetic retinopathy with macular edema, bilateral: Secondary | ICD-10-CM | POA: Diagnosis not present

## 2021-12-03 ENCOUNTER — Other Ambulatory Visit: Payer: Self-pay | Admitting: Cardiovascular Disease

## 2021-12-03 DIAGNOSIS — E785 Hyperlipidemia, unspecified: Secondary | ICD-10-CM

## 2021-12-03 DIAGNOSIS — E1129 Type 2 diabetes mellitus with other diabetic kidney complication: Secondary | ICD-10-CM

## 2021-12-08 ENCOUNTER — Other Ambulatory Visit (INDEPENDENT_AMBULATORY_CARE_PROVIDER_SITE_OTHER): Payer: BC Managed Care – PPO

## 2021-12-08 DIAGNOSIS — R809 Proteinuria, unspecified: Secondary | ICD-10-CM

## 2021-12-08 DIAGNOSIS — E1129 Type 2 diabetes mellitus with other diabetic kidney complication: Secondary | ICD-10-CM

## 2021-12-08 LAB — LIPID PANEL
Cholesterol: 103 mg/dL (ref 0–200)
HDL: 38.6 mg/dL — ABNORMAL LOW (ref >39.00)
LDL Cholesterol: 44 mg/dL (ref 0–99)
NonHDL: 64.64
Total CHOL/HDL Ratio: 3
Triglycerides: 101 mg/dL (ref 0.0–149.0)
VLDL: 20.2 mg/dL (ref 0.0–40.0)

## 2021-12-08 LAB — HEMOGLOBIN A1C: Hgb A1c MFr Bld: 7.9 % — ABNORMAL HIGH (ref 4.6–6.5)

## 2021-12-13 ENCOUNTER — Ambulatory Visit (INDEPENDENT_AMBULATORY_CARE_PROVIDER_SITE_OTHER): Payer: BC Managed Care – PPO | Admitting: Family Medicine

## 2021-12-13 ENCOUNTER — Encounter: Payer: Self-pay | Admitting: Family Medicine

## 2021-12-13 VITALS — BP 120/76 | HR 90 | Temp 98.0°F | Resp 17 | Ht 75.0 in | Wt 275.2 lb

## 2021-12-13 DIAGNOSIS — E1129 Type 2 diabetes mellitus with other diabetic kidney complication: Secondary | ICD-10-CM

## 2021-12-13 DIAGNOSIS — E11319 Type 2 diabetes mellitus with unspecified diabetic retinopathy without macular edema: Secondary | ICD-10-CM

## 2021-12-13 DIAGNOSIS — R809 Proteinuria, unspecified: Secondary | ICD-10-CM

## 2021-12-13 MED ORDER — OLMESARTAN MEDOXOMIL 5 MG PO TABS: 5.0000 mg | ORAL_TABLET | Freq: Every day | ORAL | 3 refills | Status: DC

## 2021-12-13 MED ORDER — METFORMIN HCL 1000 MG PO TABS: 1000.0000 mg | ORAL_TABLET | Freq: Two times a day (BID) | ORAL | 2 refills | Status: DC

## 2021-12-13 NOTE — Progress Notes (Signed)
? ?Subjective:  ?Patient ID: David Crane, male    DOB: Nov 25, 1965  Age: 56 y.o. MRN: [id number] ? ?CC:  ?Chief Complaint  ?Patient presents with  ? Diabetes  ?  Pt reports had lab work done, notes A1c went up, doing okay, requesting records from [facility] for Diabetic eye exam   ? ? ?HPI ?David Crane presents for  ? ?Diabetes: ?Complicated by peripheral neuropathy, microalbuminuria, retinopathy, followed by retinal specialist ?Metformin 1000 mg twice daily, glipizide 5 mg twice daily.  He is on statin as well as gabapentin for neuropathic symptoms.  He did not tolerate ACE inhibitor due to fatigue.  Improved A1c at his December visit active at work at that time. ?Still taking metformin 1043m BID, glipizide 574mBID.  ?Gabapentin 30051m600m8mS, occasional 300mg40mafternoon.  ?Some dietary indiscretion since last visit - some increased carbs.  ?Home readings: ?Fasting:123-140 ?Postprandial: 105 - over 3 hours after eating.  ?No symptomatic lows.  ?Microalbumin: Ratio of 83 on 08/04/2020, ordered.  Unable to tolerate ACE-I as above. ?Optho, foot exam, pneumovax: ?Ongoing care with retinal specialist.  Records requested. ?Still active with work.  ?Wt Readings from Last 3 Encounters:  ?12/13/21 275 lb 3.2 oz (124.8 kg)  ?09/06/21 270 lb 3.2 oz (122.6 kg)  ?06/01/21 273 lb 9.6 oz (124.1 kg)  ?Not wearing shoes in December reading. Wearing 4# shoes today.  ? ? ?Lab Results  ?Component Value Date  ? HGBA1C 7.9 (H) 12/08/2021  ? HGBA1C 6.8 09/06/2021  ? HGBA1C 7.6 (H) 06/01/2021  ? ?Lab Results  ?Component Value Date  ? LDLCALC 44 12/08/2021  ? CREATININE 1.17 06/01/2021  ? ? ?Hyperlipidemia: ?Lipitor 40mg 79mNo new myalgias/side effects.  ?Lab Results  ?Component Value Date  ? CHOL 103 12/08/2021  ? HDL 38.60 (L) 12/08/2021  ? LDLCALC 44 12/08/2021  ? TRIG 101.0 12/08/2021  ? CHOLHDL 3 12/08/2021  ? ?Lab Results  ?Component Value Date  ? ALT 16 06/01/2021  ? AST 18 06/01/2021  ? ALKPHOS 45 06/01/2021  ?  BILITOT 1.0 06/01/2021  ? ? ? ? ?History ?Patient Active Problem List  ? Diagnosis Date Noted  ? Diabetic nephropathy (HCC) 0[place]5/2018  ? Diabetes (HCC) 0[place]5/2018  ? Peripheral neuropathy 05/01/2017  ? ?Past Medical History:  ?Diagnosis Date  ? Diabetes mellitus without complication (HCC)  [place]   ? Hyperlipidemia   ? Hypertension   ? ?Past Surgical History:  ?Procedure Laterality Date  ? Growth removed Left   ? TONSILLECTOMY    ? WISDOM TOOTH EXTRACTION    ? ?No Known Allergies ?Prior to Admission medications   ?Medication Sig Start Date End Date Taking? Authorizing Provider  ?aspirin EC 81 MG tablet Take 1 tablet (81 mg total) by mouth daily. Swallow whole. 06/15/20  Yes O'Neal, WesleyCassie Freer?atorvastatin (LIPITOR) 40 MG tablet Take 1 tablet by mouth once daily 12/04/21  Yes O'Neal, WesleyCassie Freer?blood glucose meter kit and supplies Dispense based on patient and insurance preference. Check twice per day. FOR ICD-9 250.00, 250.01). 04/29/17  Yes GreeneWendie Agreste?gabapentin (NEURONTIN) 300 MG capsule Take 1-2 capsules (300-600 mg total) by mouth 3 (three) times daily as needed. 09/12/21  Yes GreeneWendie Agreste?glipiZIDE (GLUCOTROL) 5 MG tablet TAKE 1 TABLET BY MOUTH TWICE DAILY BEFORE A MEAL 10/30/21  Yes GreeneWendie Agreste?glucose blood test strip Use as instructed 11/16/20  Yes Wendie Agreste, MD  ?Lancets 30G MISC Check sugar twice a day before meals 01/02/19  Yes Wendie Agreste, MD  ?metFORMIN (GLUCOPHAGE) 1000 MG tablet TAKE 1 TABLET BY MOUTH TWICE DAILY WITH MEALS 10/02/21  Yes Wendie Agreste, MD  ?tadalafil (CIALIS) 20 MG tablet Take 20 mg by mouth daily as needed. 07/22/21  Yes [provider]  ? ?Social History  ? ?Socioeconomic History  ? Marital status: Married  ?  Spouse name: Not on file  ? Number of children: 2  ? Years of education: Not on file  ? Highest education level: Not on file  ?Occupational History  ? Not on file   ?Tobacco Use  ? Smoking status: Never  ? Smokeless tobacco: Never  ?Vaping Use  ? Vaping Use: Never used  ?Substance and Sexual Activity  ? Alcohol use: Yes  ?  Comment: occ  ? Drug use: No  ? Sexual activity: Yes  ?Other Topics Concern  ? Not on file  ?Social History Narrative  ? Not on file  ? ?Social Determinants of Health  ? ?Financial Resource Strain: Not on file  ?Food Insecurity: Not on file  ?Transportation Needs: Not on file  ?Physical Activity: Not on file  ?Stress: Not on file  ?Social Connections: Not on file  ?Intimate Partner Violence: Not on file  ? ? ?Review of Systems  ?Constitutional:  Negative for fatigue and unexpected weight change.  ?Eyes:  Negative for visual disturbance.  ?Respiratory:  Negative for cough, chest tightness and shortness of breath.   ?Cardiovascular:  Negative for chest pain, palpitations and leg swelling.  ?Gastrointestinal:  Negative for abdominal pain and blood in stool.  ?Neurological:  Negative for dizziness, light-headedness and headaches.  ? ? ?Objective:  ? ?Vitals:  ? 12/13/21 1534  ?BP: 120/76  ?Pulse: 90  ?Resp: 17  ?Temp: 98 ?F (36.7 ?C)  ?TempSrc: Temporal  ?SpO2: 96%  ?Weight: 275 lb 3.2 oz (124.8 kg)  ?Height: '6\' 3"'  (1.905 m)  ? ?BP Readings from Last 3 Encounters:  ?12/13/21 120/76  ?09/06/21 122/76  ?06/01/21 118/78  ? ? ? ? ?Physical Exam ?Vitals reviewed.  ?Constitutional:   ?   Appearance: He is well-developed.  ?HENT:  ?   Head: Normocephalic and atraumatic.  ?Neck:  ?   Vascular: No carotid bruit or JVD.  ?Cardiovascular:  ?   Rate and Rhythm: Normal rate and regular rhythm.  ?   Heart sounds: Normal heart sounds. No murmur heard. ?Pulmonary:  ?   Effort: Pulmonary effort is normal.  ?   Breath sounds: Normal breath sounds. No rales.  ?Musculoskeletal:  ?   Right lower leg: No edema.  ?   Left lower leg: No edema.  ?Skin: ?   General: Skin is warm and dry.  ?Neurological:  ?   Mental Status: He is alert and oriented to person, place, and time.   ?Psychiatric:     ?   Mood and Affect: Mood normal.  ? ? ? ? ? ?Assessment & Plan:  ?David Crane is a 56 y.o. male . ?Diabetic retinopathy associated with type 2 diabetes mellitus, macular edema presence unspecified, unspecified laterality, unspecified retinopathy severity ([place]) ? ?Type 2 diabetes mellitus with microalbuminuria, without long-term current use of insulin (HCC) - Plan: metFORMIN (GLUCOPHAGE) 1000 MG tablet, Microalbumin / creatinine urine ratio, olmesartan (BENICAR) 5 MG tablet ?Decreased diabetic control.  With complications above stressed importance of A1c below 7.  He does feel  like he can make significant diet changes and A1c did improve previously.  We will continue same regimen for now, close home monitoring with RTC precautions if not improving with dietary changes.  77-monthfollow-up.  With microalbuminuria will try ARB low-dose with potential side effects discussed, stop if new side effects.  Did not tolerate ACE inhibitor. ? ?Meds ordered this encounter  ?Medications  ? metFORMIN (GLUCOPHAGE) 1000 MG tablet  ?  Sig: Take 1 tablet (1,000 mg total) by mouth 2 (two) times daily with a meal.  ?  Dispense:  180 tablet  ?  Refill:  2  ? olmesartan (BENICAR) 5 MG tablet  ?  Sig: Take 1 tablet (5 mg total) by mouth daily.  ?  Dispense:  30 tablet  ?  Refill:  3  ? ?Patient Instructions  ?If are able to make diet changes can try to keep same meds to see if blood sugar control will get back to December reading. ?Keep a record of your blood sugars outside of the office and bring them to the next office visit. Fasting and 2 hours after meals.  ?If readings are not improving with changes in diet in next month - return for med changes. 3 months ok if numbers improving.  ?If new side effects with low dose blood pressure med for protein in the urine, stop taking.  ? ? ? ? ? ?Signed,  ? ?JMerri Ray MD ?[facility]?[place]?12/13/21 ?9:35 PM ? ? ?

## 2021-12-13 NOTE — Patient Instructions (Addendum)
If are able to make diet changes can try to keep same meds to see if blood sugar control will get back to December reading. ?Keep a record of your blood sugars outside of the office and bring them to the next office visit. Fasting and 2 hours after meals.  ?If readings are not improving with changes in diet in next month - return for med changes. 3 months ok if numbers improving.  ?If new side effects with low dose blood pressure med for protein in the urine, stop taking.  ? ? ?

## 2021-12-14 LAB — MICROALBUMIN / CREATININE URINE RATIO
Creatinine,U: 210.8 mg/dL
Microalb Creat Ratio: 1 mg/g (ref 0.0–30.0)
Microalb, Ur: 2.1 mg/dL — ABNORMAL HIGH (ref 0.0–1.9)

## 2022-01-03 DIAGNOSIS — E113311 Type 2 diabetes mellitus with moderate nonproliferative diabetic retinopathy with macular edema, right eye: Secondary | ICD-10-CM | POA: Diagnosis not present

## 2022-01-03 DIAGNOSIS — E113313 Type 2 diabetes mellitus with moderate nonproliferative diabetic retinopathy with macular edema, bilateral: Secondary | ICD-10-CM | POA: Diagnosis not present

## 2022-01-29 ENCOUNTER — Encounter: Payer: Self-pay | Admitting: Registered Nurse

## 2022-01-29 ENCOUNTER — Ambulatory Visit (INDEPENDENT_AMBULATORY_CARE_PROVIDER_SITE_OTHER): Payer: BC Managed Care – PPO | Admitting: Registered Nurse

## 2022-01-29 ENCOUNTER — Other Ambulatory Visit: Payer: Self-pay

## 2022-01-29 VITALS — BP 122/79 | HR 80 | Temp 98.4°F | Resp 18 | Ht 75.0 in | Wt 267.2 lb

## 2022-01-29 DIAGNOSIS — R051 Acute cough: Secondary | ICD-10-CM

## 2022-01-29 DIAGNOSIS — J22 Unspecified acute lower respiratory infection: Secondary | ICD-10-CM

## 2022-01-29 MED ORDER — BENZONATATE 200 MG PO CAPS
200.0000 mg | ORAL_CAPSULE | Freq: Two times a day (BID) | ORAL | 0 refills | Status: DC | PRN
Start: 2022-01-29 — End: 2022-03-26

## 2022-01-29 MED ORDER — DOXYCYCLINE HYCLATE 100 MG PO TABS: 100.0000 mg | ORAL_TABLET | Freq: Two times a day (BID) | ORAL | 0 refills | Status: DC

## 2022-01-29 MED ORDER — PREDNISONE 20 MG PO TABS: 20.0000 mg | ORAL_TABLET | Freq: Every day | ORAL | 0 refills | Status: DC

## 2022-01-29 NOTE — Patient Instructions (Addendum)
Mr. Vonbehren - ? ?Great to meet you! ? ?Take doxycycline until you run out ? ?Take prednisone in the mornings. ? ?I will send tessalon for cough ? ?Call if not any better by Thursday morning ? ?Thank you, ? ?Rich  ?

## 2022-01-29 NOTE — Progress Notes (Signed)
? ?Acute Office Visit ? ?Subjective:  ? ? Patient ID: David Crane, male    DOB: 08/22/1966, 56 y.o.   MRN: [id number] ? ?Chief Complaint  ?Patient presents with  ? Cough  ?  Patient states 2 weeks ago he came back from [place] with a head cold 2 weeks ago. Patient states he throw up , coughing , and had a fever. Patient states he felt a lot better but sill have that cough and feels like something is in his throat.  ? ? ?HPI ?Patient is in today for cough ? ?Returned from travel to [facility] 2 weeks ago ?Had nasal congestion pnd, cough, pressure in ears.  ?Improved, then worsened over the weekend. ?Developed cough with 1 episode of vomiting. ? ?Has been taking delsym, OTC cough/cold relief. ? ?Does have some fatigue. ? ?Outpatient Medications Prior to Visit  ?Medication Sig Dispense Refill  ? aspirin EC 81 MG tablet Take 1 tablet (81 mg total) by mouth daily. Swallow whole. 90 tablet 3  ? atorvastatin (LIPITOR) 40 MG tablet Take 1 tablet by mouth once daily 90 tablet 2  ? blood glucose meter kit and supplies Dispense based on patient and insurance preference. Check twice per day. FOR ICD-9 250.00, 250.01). 1 each 0  ? gabapentin (NEURONTIN) 300 MG capsule Take 1-2 capsules (300-600 mg total) by mouth 3 (three) times daily as needed. 540 capsule 1  ? glipiZIDE (GLUCOTROL) 5 MG tablet TAKE 1 TABLET BY MOUTH TWICE DAILY BEFORE A MEAL 180 tablet 1  ? glucose blood test strip Use as instructed 100 each 12  ? Lancets 30G MISC Check sugar twice a day before meals 200 each 2  ? metFORMIN (GLUCOPHAGE) 1000 MG tablet Take 1 tablet (1,000 mg total) by mouth 2 (two) times daily with a meal. 180 tablet 2  ? tadalafil (CIALIS) 20 MG tablet Take 20 mg by mouth daily as needed.    ? olmesartan (BENICAR) 5 MG tablet Take 1 tablet (5 mg total) by mouth daily. (Patient not taking: Reported on 01/29/2022) 30 tablet 3  ? ?No facility-administered medications prior to visit.  ? ? ?Review of Systems ?Per hpi  ? ?   ?Objective:  ?  ?BP  122/79   Pulse 80   Temp 98.4 ?F (36.9 ?C) (Temporal)   Resp 18   Ht 6' 3" (1.905 m)   Wt 267 lb 3.2 oz (121.2 kg)   SpO2 99%   BMI 33.40 kg/m?  ?Physical Exam ?Constitutional:   ?   General: He is not in acute distress. ?   Appearance: Normal appearance. He is normal weight. He is not ill-appearing, toxic-appearing or diaphoretic.  ?Cardiovascular:  ?   Rate and Rhythm: Normal rate and regular rhythm.  ?   Heart sounds: Normal heart sounds. No murmur heard. ?  No friction rub. No gallop.  ?Pulmonary:  ?   Effort: Pulmonary effort is normal. No respiratory distress.  ?   Breath sounds: No stridor. Wheezing present. No rhonchi or rales.  ?Chest:  ?   Chest wall: No tenderness.  ?Neurological:  ?   General: No focal deficit present.  ?   Mental Status: He is alert and oriented to person, place, and time. Mental status is at baseline.  ?Psychiatric:     ?   Mood and Affect: Mood normal.     ?   Behavior: Behavior normal.     ?   Thought Content: Thought content normal.     ?  Judgment: Judgment normal.  ? ? ?No results found for any visits on 01/29/22. ? ? ?   ?Assessment & Plan:  ?1. Lower respiratory infection ?- predniSONE (DELTASONE) 20 MG tablet; Take 1 tablet (20 mg total) by mouth daily with breakfast.  Dispense: 5 tablet; Refill: 0 ?- doxycycline (VIBRA-TABS) 100 MG tablet; Take 1 tablet (100 mg total) by mouth 2 (two) times daily.  Dispense: 20 tablet; Refill: 0 ? ?2. Acute cough ?- benzonatate (TESSALON) 200 MG capsule; Take 1 capsule (200 mg total) by mouth 2 (two) times daily as needed for cough.  Dispense: 20 capsule; Refill: 0 ? ? ? ?Meds ordered this encounter  ?Medications  ? predniSONE (DELTASONE) 20 MG tablet  ?  Sig: Take 1 tablet (20 mg total) by mouth daily with breakfast.  ?  Dispense:  5 tablet  ?  Refill:  0  ?  Order Specific Question:   Supervising Provider  ?  Answer:   Carlota Raspberry, JEFFREY R [[id number]]  ? doxycycline (VIBRA-TABS) 100 MG tablet  ?  Sig: Take 1 tablet (100 mg total) by mouth 2  (two) times daily.  ?  Dispense:  20 tablet  ?  Refill:  0  ?  Order Specific Question:   Supervising Provider  ?  Answer:   Carlota Raspberry, JEFFREY R [[id number]]  ? benzonatate (TESSALON) 200 MG capsule  ?  Sig: Take 1 capsule (200 mg total) by mouth 2 (two) times daily as needed for cough.  ?  Dispense:  20 capsule  ?  Refill:  0  ?  Order Specific Question:   Supervising Provider  ?  Answer:   Carlota Raspberry, JEFFREY R [[id number]]  ? ? ?Return if symptoms worsen or fail to improve. ? ?PLAN ?Suspect lower respiratory infection ?Doxycycline and prednisone ?Reviewed risks, benefits, and side effects, pt voices understanding. ?He will closely monitor sugars.  ?Patient encouraged to call clinic with any questions, comments, or concerns. ? ? ?Maximiano Coss, NP ?

## 2022-02-05 DIAGNOSIS — E113313 Type 2 diabetes mellitus with moderate nonproliferative diabetic retinopathy with macular edema, bilateral: Secondary | ICD-10-CM | POA: Diagnosis not present

## 2022-02-21 ENCOUNTER — Ambulatory Visit
Admission: RE | Admit: 2022-02-21 | Discharge: 2022-02-21 | Disposition: A | Discharge status: Home / Self Care | Payer: BC Managed Care – PPO | Source: Ambulatory Visit | Attending: Registered Nurse | Admitting: Registered Nurse

## 2022-02-21 ENCOUNTER — Other Ambulatory Visit: Payer: Self-pay

## 2022-02-21 ENCOUNTER — Ambulatory Visit (INDEPENDENT_AMBULATORY_CARE_PROVIDER_SITE_OTHER): Payer: BC Managed Care – PPO | Admitting: Registered Nurse

## 2022-02-21 ENCOUNTER — Encounter: Payer: Self-pay | Admitting: Registered Nurse

## 2022-02-21 VITALS — BP 118/74 | HR 93 | Temp 98.2°F | Resp 18 | Ht 75.0 in | Wt 261.2 lb

## 2022-02-21 DIAGNOSIS — R5383 Other fatigue: Secondary | ICD-10-CM

## 2022-02-21 DIAGNOSIS — J069 Acute upper respiratory infection, unspecified: Secondary | ICD-10-CM

## 2022-02-21 DIAGNOSIS — R1013 Epigastric pain: Secondary | ICD-10-CM | POA: Diagnosis not present

## 2022-02-21 DIAGNOSIS — J22 Unspecified acute lower respiratory infection: Secondary | ICD-10-CM | POA: Diagnosis not present

## 2022-02-21 LAB — COMPREHENSIVE METABOLIC PANEL
ALT: 13 U/L (ref 0–53)
AST: 12 U/L (ref 0–37)
Albumin: 4.1 g/dL (ref 3.5–5.2)
Alkaline Phosphatase: 51 U/L (ref 39–117)
BUN: 16 mg/dL (ref 6–23)
CO2: 28 mEq/L (ref 19–32)
Calcium: 9.8 mg/dL (ref 8.4–10.5)
Chloride: 95 mEq/L — ABNORMAL LOW (ref 96–112)
Creatinine, Ser: 0.86 mg/dL (ref 0.40–1.50)
GFR: 97.1 mL/min (ref >60.00)
Glucose, Bld: 266 mg/dL — ABNORMAL HIGH (ref 70–99)
Potassium: 4.9 mEq/L (ref 3.5–5.1)
Sodium: 134 mEq/L — ABNORMAL LOW (ref 135–145)
Total Bilirubin: 0.9 mg/dL (ref 0.2–1.2)
Total Protein: 7.6 g/dL (ref 6.0–8.3)

## 2022-02-21 LAB — CBC WITH DIFFERENTIAL/PLATELET
Basophils Absolute: 0 10*3/uL (ref 0.0–0.1)
Basophils Relative: 0.5 % (ref 0.0–3.0)
Eosinophils Absolute: 0.4 10*3/uL (ref 0.0–0.7)
Eosinophils Relative: 5.5 % — ABNORMAL HIGH (ref 0.0–5.0)
HCT: 36.3 % — ABNORMAL LOW (ref 39.0–52.0)
Hemoglobin: 12.4 g/dL — ABNORMAL LOW (ref 13.0–17.0)
Lymphocytes Relative: 17 % (ref 12.0–46.0)
Lymphs Abs: 1.1 10*3/uL (ref 0.7–4.0)
MCHC: 34 g/dL (ref 30.0–36.0)
MCV: 86 fl (ref 78.0–100.0)
Monocytes Absolute: 0.5 10*3/uL (ref 0.1–1.0)
Monocytes Relative: 7 % (ref 3.0–12.0)
Neutro Abs: 4.6 10*3/uL (ref 1.4–7.7)
Neutrophils Relative %: 70 % (ref 43.0–77.0)
Platelets: 265 10*3/uL (ref 150.0–400.0)
RBC: 4.22 Mil/uL (ref 4.22–5.81)
RDW: 13.5 % (ref 11.5–15.5)
WBC: 6.5 10*3/uL (ref 4.0–10.5)

## 2022-02-21 LAB — B12 AND FOLATE PANEL
Folate: 9.1 ng/mL (ref >5.9)
Vitamin B-12: 310 pg/mL (ref 211–911)

## 2022-02-21 LAB — TSH: TSH: 0.88 u[IU]/mL (ref 0.35–5.50)

## 2022-02-21 LAB — VITAMIN D 25 HYDROXY (VIT D DEFICIENCY, FRACTURES): VITD: 33.08 ng/mL (ref 30.00–100.00)

## 2022-02-21 LAB — HEMOGLOBIN A1C: Hgb A1c MFr Bld: 8.3 % — ABNORMAL HIGH (ref 4.6–6.5)

## 2022-02-21 MED ORDER — PANTOPRAZOLE SODIUM 40 MG PO TBEC: 40.0000 mg | DELAYED_RELEASE_TABLET | Freq: Every day | ORAL | 0 refills | Status: DC

## 2022-02-21 NOTE — Progress Notes (Signed)
Acute Office Visit  Subjective:    Patient ID: David Crane, male    DOB: July 14, 1966, 56 y.o.   MRN: [id number]  Chief Complaint  Patient presents with   Fatigue    Patient states he has been very tired , no energy started last week. Patient states he has been having some problems with feeling nauseous and feeling upset. He thinks he has some allergy problem or maybe a ulcer    HPI Patient is in today for fatigue  Ongoing for a week, worsening Significantly worse on Friday, 02/16/22 That day he spent on a boat, taking it easy. Felt very worn out at end of day.  By the evening, he was completely drained.   Saturday felt a little better, Sunday he felt worse again Felt fine again on Monday, then Tuesday felt stomach churning, nausea.  Does not feel like himself. Stomach cramping - epigastric pain. Some tightness in chest - recent bronchitis that he has been getting over, hx of allergies and acknowledges poor air quality.  Does admit to stress at work that may contribute.   Denies shob, doe, chest pain, melena, hematochezia, urinary symptoms, headaches, visual changes.   Has checked blood sugars: yesterday was 154.   Outpatient Medications Prior to Visit  Medication Sig Dispense Refill   aspirin EC 81 MG tablet Take 1 tablet (81 mg total) by mouth daily. Swallow whole. 90 tablet 3   atorvastatin (LIPITOR) 40 MG tablet Take 1 tablet by mouth once daily 90 tablet 2   benzonatate (TESSALON) 200 MG capsule Take 1 capsule (200 mg total) by mouth 2 (two) times daily as needed for cough. 20 capsule 0   blood glucose meter kit and supplies Dispense based on patient and insurance preference. Check twice per day. FOR ICD-9 250.00, 250.01). 1 each 0   gabapentin (NEURONTIN) 300 MG capsule Take 1-2 capsules (300-600 mg total) by mouth 3 (three) times daily as needed. 540 capsule 1   glipiZIDE (GLUCOTROL) 5 MG tablet TAKE 1 TABLET BY MOUTH TWICE DAILY BEFORE A MEAL 180 tablet 1   glucose  blood test strip Use as instructed 100 each 12   Lancets 30G MISC Check sugar twice a day before meals 200 each 2   metFORMIN (GLUCOPHAGE) 1000 MG tablet Take 1 tablet (1,000 mg total) by mouth 2 (two) times daily with a meal. 180 tablet 2   olmesartan (BENICAR) 5 MG tablet Take 1 tablet (5 mg total) by mouth daily. 30 tablet 3   doxycycline (VIBRA-TABS) 100 MG tablet Take 1 tablet (100 mg total) by mouth 2 (two) times daily. 20 tablet 0   predniSONE (DELTASONE) 20 MG tablet Take 1 tablet (20 mg total) by mouth daily with breakfast. 5 tablet 0   tadalafil (CIALIS) 20 MG tablet Take 20 mg by mouth daily as needed.     No facility-administered medications prior to visit.    Review of Systems  Constitutional: Negative.   HENT: Negative.    Eyes: Negative.   Respiratory: Negative.    Cardiovascular: Negative.   Gastrointestinal: Negative.   Genitourinary: Negative.   Musculoskeletal: Negative.   Skin: Negative.   Neurological: Negative.   Psychiatric/Behavioral: Negative.    All other systems reviewed and are negative.     Objective:    Pulse 93   Temp 98.2 F (36.8 C) (Temporal)   Resp 18   Ht '6\' 3"'  (1.905 m)   Wt 261 lb 3.2 oz (118.5 kg)   SpO2  97%   BMI 32.65 kg/m  Physical Exam Constitutional:      General: He is not in acute distress.    Appearance: Normal appearance. He is normal weight. He is not ill-appearing, toxic-appearing or diaphoretic.  Cardiovascular:     Rate and Rhythm: Normal rate and regular rhythm.     Heart sounds: Normal heart sounds. No murmur heard.   No friction rub. No gallop.  Pulmonary:     Effort: Pulmonary effort is normal. No respiratory distress.     Breath sounds: Normal breath sounds. No stridor. No wheezing, rhonchi or rales.  Chest:     Chest wall: No tenderness.  Neurological:     General: No focal deficit present.     Mental Status: He is alert and oriented to person, place, and time. Mental status is at baseline.  Psychiatric:         Mood and Affect: Mood normal.        Behavior: Behavior normal.        Thought Content: Thought content normal.        Judgment: Judgment normal.    No results found for any visits on 02/21/22.      Assessment & Plan:  1. Fatigue, unspecified type - CBC with Differential/Platelet - Comprehensive metabolic panel - Hemoglobin A1c - TSH - B12 and Folate Panel - Vitamin D (25 hydroxy) - DG Chest 2 View; Future  2. Epigastric pain - pantoprazole (PROTONIX) 40 MG tablet; Take 1 tablet (40 mg total) by mouth daily.  Dispense: 90 tablet; Refill: 0 - CBC with Differential/Platelet - Comprehensive metabolic panel - Hemoglobin A1c - TSH - B12 and Folate Panel - Vitamin D (25 hydroxy)  3. Recent upper respiratory tract infection - CBC with Differential/Platelet - Comprehensive metabolic panel - Hemoglobin A1c - TSH - B12 and Folate Panel - Vitamin D (25 hydroxy) - DG Chest 2 View; Future    Meds ordered this encounter  Medications   pantoprazole (PROTONIX) 40 MG tablet    Sig: Take 1 tablet (40 mg total) by mouth daily.    Dispense:  90 tablet    Refill:  0    Order Specific Question:   Supervising Provider    Answer:   Carlota Raspberry, JEFFREY R [[id number]]    Return if symptoms worsen or fail to improve.  PLAN Will obtain cxr to rule out pna given recent respiratory infection  Labs as above. Will follow up as indicated. Given epigastric pain, will start PPI. Reviewed risks. Monitor effect. Patient encouraged to call clinic with any questions, comments, or concerns.   Maximiano Coss, NP

## 2022-02-21 NOTE — Addendum Note (Signed)
Addended by: Maximiano Coss on: 02/21/2022 08:08 AM   Modules accepted: Orders

## 2022-02-21 NOTE — Patient Instructions (Addendum)
David Crane -   Doristine Devoid to see you  Let's figure out what's going on.  Xray down at [facility]. They take walk ins. Ok to head there now.  I'll be in touch with results  Thanks,  Rich     If you have lab work done today you will be contacted with your lab results within the next 2 weeks.  If you have not heard from [place] then please contact us. The fastest way to get your results is to register for My Chart.   IF you received an x-ray today, you will receive an invoice from [facility]. Please contact [facility] at [phone] with questions or concerns regarding your invoice.   IF you received labwork today, you will receive an invoice from [facility]. Please contact [facility] at [phone] with questions or concerns regarding your invoice.   Our billing staff will not be able to assist you with questions regarding bills from these companies.  You will be contacted with the lab results as soon as they are available. The fastest way to get your results is to activate your My Chart account. Instructions are located on the last page of this paperwork. If you have not heard from [place] regarding the results in 2 weeks, please contact this office.

## 2022-02-21 NOTE — Progress Notes (Signed)
Dr. Carlota Raspberry - FYI  Clinical Pool - if we could get him scheduled for 2 week follow up with pcp, that would be great.  Thanks,  Denice Paradise

## 2022-02-22 ENCOUNTER — Telehealth: Payer: Self-pay

## 2022-02-22 NOTE — Telephone Encounter (Signed)
-----   Message from Maximiano Coss, NP sent at 02/21/2022  2:39 PM EDT ----- Dr. Carlota Raspberry - FYI  Clinical Pool - if we could get him scheduled for 2 week follow up with pcp, that would be great.  Thanks,  Denice Paradise

## 2022-02-22 NOTE — Telephone Encounter (Signed)
Spoke w/ pt and tried to scheduling the pt a 2 wk f/u with Dr Carlota Raspberry . He wants to wait and if the issue gets worse then he call and make an apt .

## 2022-03-05 ENCOUNTER — Encounter: Payer: Self-pay | Admitting: Family Medicine

## 2022-03-05 ENCOUNTER — Ambulatory Visit (INDEPENDENT_AMBULATORY_CARE_PROVIDER_SITE_OTHER): Payer: BC Managed Care – PPO | Admitting: Family Medicine

## 2022-03-05 VITALS — BP 120/72 | HR 97 | Temp 97.9°F | Resp 17 | Ht 75.0 in | Wt 265.4 lb

## 2022-03-05 DIAGNOSIS — R5383 Other fatigue: Secondary | ICD-10-CM

## 2022-03-05 DIAGNOSIS — D649 Anemia, unspecified: Secondary | ICD-10-CM

## 2022-03-05 DIAGNOSIS — E1129 Type 2 diabetes mellitus with other diabetic kidney complication: Secondary | ICD-10-CM

## 2022-03-05 DIAGNOSIS — R739 Hyperglycemia, unspecified: Secondary | ICD-10-CM

## 2022-03-05 DIAGNOSIS — R809 Proteinuria, unspecified: Secondary | ICD-10-CM

## 2022-03-05 LAB — GLUCOSE, POCT (MANUAL RESULT ENTRY): POC Glucose: 220 mg/dl — AB (ref 70–99)

## 2022-03-05 MED ORDER — EMPAGLIFLOZIN 10 MG PO TABS: 10.0000 mg | ORAL_TABLET | Freq: Every day | ORAL | 2 refills | Status: DC

## 2022-03-05 NOTE — Patient Instructions (Addendum)
I will repeat bloodwork today. Ok to continue protonix once per day for now.  Add Jardiance once per day for improved diabetes control.  Check your blood sugars twice per day either fasting or 2 hours after meal.  Keep a record of those for your next visit.  If you have any readings below 110 then temporarily stop glipizide and watch your readings still twice per day.  If any readings above 200, let me know.  Recheck in July as planned but let me know if there are questions between now and then.  Take care!

## 2022-03-05 NOTE — Telephone Encounter (Signed)
I have scheduled this patient an appointment with his PCP today.

## 2022-03-05 NOTE — Progress Notes (Unsigned)
Subjective:  Patient ID: David Crane, male    DOB: Jan 23, 1966  Age: 56 y.o. MRN: [id number]  CC:  Chief Complaint  Patient presents with   Fatigue    Pt here for follow up 2 weeks notes fatigue is still present    Diabetes    Concerned his numbers are up from previous without cause    HPI David Crane presents for   Fatigue Seen by my colleague on June 7.  Note reviewed.  Reported fatigue for 1 week at that time.  Some nausea, upset stomach at times.  Cramping/epigastric abdominal pain with some tightness in chest with a recent bronchitis.  Some increased stress at work discussed.  Chest x-ray June 8, no active cardiopulmonary disease CBC with hemoglobin 12.4, hematocrit 36.3.  Normal platelets, normal WBC.  Hemoglobin decreased from 14.1In July 2021.  TSH was normal.  CMP with borderline sodium 134, chloride 95, glucose 266, otherwise normal.  A1c slightly elevated 8.3.  Started on PPI due to epigastric pain, Protonix 40 mg daily.  Epigastric pain resolved with a day or two. No dark/black stools. No bleeding noted. Still taking protonix once per day. Overall fatigue better. Run down at end of week - attributes some of fatigue with stress of job? Home readings - last few weeks higher readings - up to 184 fasting (usually 140's),  postprandial 233.  Not sure of usual postprandial readings.   Eating some keto snacks, but not keto diet. Has been remaining active, trying to watch meals.  No symptomatic lows.  Random 156 after 5 hrs work.  No missed doses of meds.  No chest pain/dyspnea.  No focal weakness.  History of diabetes with retinopathy, polyneuropathy.  Borderline A1c in March, planned on diet changes, continued same meds at that time. Cardiology evaluation for tachycardia, chest pain, dyspnea in September 2021 coronary calcium scoring in September 2021, score 75, 78th percentile.  Minimal calcified plaque less than 25% in the LAD, left circumflex and RCA.  Small  PFO.   History Patient Active Problem List   Diagnosis Date Noted   Diabetic nephropathy ([place]) 05/01/2017   Diabetes ([place]) 05/01/2017   Peripheral neuropathy 05/01/2017   Past Medical History:  Diagnosis Date   Diabetes mellitus without complication ([place])    GERD (gastroesophageal reflux disease)    Hyperlipidemia    Hypertension    Past Surgical History:  Procedure Laterality Date   Growth removed Left    TONSILLECTOMY     WISDOM TOOTH EXTRACTION     No Known Allergies Prior to Admission medications   Medication Sig Start Date End Date Taking? Authorizing Provider  aspirin EC 81 MG tablet Take 1 tablet (81 mg total) by mouth daily. Swallow whole. 06/15/20  Yes O'Neal, Cassie Freer, MD  atorvastatin (LIPITOR) 40 MG tablet Take 1 tablet by mouth once daily 12/04/21  Yes O'Neal, Cassie Freer, MD  benzonatate (TESSALON) 200 MG capsule Take 1 capsule (200 mg total) by mouth 2 (two) times daily as needed for cough. 01/29/22  Yes Maximiano Coss, NP  blood glucose meter kit and supplies Dispense based on patient and insurance preference. Check twice per day. FOR ICD-9 250.00, 250.01). 04/29/17  Yes Wendie Agreste, MD  gabapentin (NEURONTIN) 300 MG capsule Take 1-2 capsules (300-600 mg total) by mouth 3 (three) times daily as needed. 09/12/21  Yes Wendie Agreste, MD  glipiZIDE (GLUCOTROL) 5 MG tablet TAKE 1 TABLET BY MOUTH TWICE DAILY BEFORE A MEAL 10/30/21  Yes Wendie Agreste, MD  glucose blood test strip Use as instructed 11/16/20  Yes Wendie Agreste, MD  Lancets 30G MISC Check sugar twice a day before meals 01/02/19  Yes Wendie Agreste, MD  metFORMIN (GLUCOPHAGE) 1000 MG tablet Take 1 tablet (1,000 mg total) by mouth 2 (two) times daily with a meal. 12/13/21  Yes Wendie Agreste, MD  olmesartan (BENICAR) 5 MG tablet Take 1 tablet (5 mg total) by mouth daily. 12/13/21  Yes Wendie Agreste, MD  pantoprazole (PROTONIX) 40 MG tablet Take 1 tablet (40 mg total) by mouth daily.  02/21/22  Yes Maximiano Coss, NP   Social History   Socioeconomic History   Marital status: Married    Spouse name: Not on file   Number of children: 2   Years of education: Not on file   Highest education level: Not on file  Occupational History   Not on file  Tobacco Use   Smoking status: Never   Smokeless tobacco: Never  Vaping Use   Vaping Use: Never used  Substance and Sexual Activity   Alcohol use: Yes    Comment: occ   Drug use: No   Sexual activity: Yes  Other Topics Concern   Not on file  Social History Narrative   Not on file   Social Determinants of Health   Financial Resource Strain: Not on file  Food Insecurity: Not on file  Transportation Needs: Not on file  Physical Activity: Not on file  Stress: Not on file  Social Connections: Not on file  Intimate Partner Violence: Not on file    Review of Systems  Per HPI.  Objective:   Vitals:   03/05/22 1503  BP: 120/72  Pulse: 97  Resp: 17  Temp: 97.9 F (36.6 C)  TempSrc: Temporal  SpO2: 97%  Weight: 265 lb 6.4 oz (120.4 kg)  Height: _0  (1.905 m)     Physical Exam Vitals reviewed.  Constitutional:      General: He is not in acute distress.    Appearance: Normal appearance. He is well-developed. He is not ill-appearing, toxic-appearing or diaphoretic.  HENT:     Head: Normocephalic and atraumatic.  Neck:     Vascular: No carotid bruit or JVD.  Cardiovascular:     Rate and Rhythm: Normal rate and regular rhythm.     Heart sounds: Normal heart sounds. No murmur heard. Pulmonary:     Effort: Pulmonary effort is normal.     Breath sounds: Normal breath sounds. No rales.  Abdominal:     General: There is no distension.     Tenderness: There is no abdominal tenderness. There is no guarding.  Musculoskeletal:     Right lower leg: No edema.     Left lower leg: No edema.  Skin:    General: Skin is warm and dry.  Neurological:     Mental Status: He is alert and oriented to person, place,  and time.  Psychiatric:        Mood and Affect: Mood normal.        Behavior: Behavior normal.        Assessment & Plan:  David Crane is a 56 y.o. male . Type 2 diabetes mellitus with microalbuminuria, without long-term current use of insulin (HCC) - Plan: POCT glucose (manual entry), empagliflozin (JARDIANCE) 10 MG TABS tablet Hyperglycemia - Plan: empagliflozin (JARDIANCE) 10 MG TABS tablet  -Recent decreased control in diabetes may be contributing to fatigue.  Add Jardiance, monitor home readings.  Hypoglycemia/hyperglycemia precautions discussed.  Stop glipizide if any lower readings.  Recheck next few weeks.  RTC/ER precautions given if acute worsening of symptoms.  Fatigue, unspecified type Anemia, unspecified type - Plan: CBC  -Epigastric pain improved with PPI, continue same.  Repeat CBC with prior anemia.  Meds ordered this encounter  Medications   empagliflozin (JARDIANCE) 10 MG TABS tablet    Sig: Take 1 tablet (10 mg total) by mouth daily before breakfast.    Dispense:  30 tablet    Refill:  2   Patient Instructions  I will repeat bloodwork today. Ok to continue protonix once per day for now.  Add Jardiance once per day for improved diabetes control.  Check your blood sugars twice per day either fasting or 2 hours after meal.  Keep a record of those for your next visit.  If you have any readings below 110 then temporarily stop glipizide and watch your readings still twice per day.  If any readings above 200, let me know.  Recheck in July as planned but let me know if there are questions between now and then.  Take care!    Signed,   [name], MD [place] 03/05/22 3:18 PM

## 2022-03-06 ENCOUNTER — Telehealth: Payer: Self-pay

## 2022-03-06 ENCOUNTER — Encounter: Payer: Self-pay | Admitting: Family Medicine

## 2022-03-06 LAB — CBC
HCT: 35.1 % — ABNORMAL LOW (ref 39.0–52.0)
Hemoglobin: 11.7 g/dL — ABNORMAL LOW (ref 13.0–17.0)
MCHC: 33.4 g/dL (ref 30.0–36.0)
MCV: 87.6 fl (ref 78.0–100.0)
Platelets: 273 10*3/uL (ref 150.0–400.0)
RBC: 4.01 Mil/uL — ABNORMAL LOW (ref 4.22–5.81)
RDW: 13.4 % (ref 11.5–15.5)
WBC: 7.5 10*3/uL (ref 4.0–10.5)

## 2022-03-06 NOTE — Telephone Encounter (Signed)
-----   Message from Wendie Agreste, MD sent at 03/06/2022  2:59 PM EDT ----- Call patient.  Hemoglobin was low and slightly lower than last testing 2 weeks ago.  This still could be contributing to his fatigue.  Please have him return for repeat eval in the next 2 days, and would like to check stool for blood as well as repeat CBC at that time.  Can be scheduled as an acute visit with me.  If any blood in the stool, dark tarry stools or new/worsening symptoms prior to that time, make sure he is seen.  Let me know if there are questions.

## 2022-03-07 ENCOUNTER — Telehealth: Payer: Self-pay

## 2022-03-07 DIAGNOSIS — E113313 Type 2 diabetes mellitus with moderate nonproliferative diabetic retinopathy with macular edema, bilateral: Secondary | ICD-10-CM | POA: Diagnosis not present

## 2022-03-07 DIAGNOSIS — E113311 Type 2 diabetes mellitus with moderate nonproliferative diabetic retinopathy with macular edema, right eye: Secondary | ICD-10-CM | POA: Diagnosis not present

## 2022-03-07 NOTE — Telephone Encounter (Signed)
Called patient.7mn call.  I addressed concerns with phone calls last night and today. He called back t this morning for results. Note was placed to call patient back this morning, and clarified timing of call back as DMarcille Blancowas assisting physician and did call back in a few hours. Explained results and rationale for repeat CBC and hemoccult testing as levels had gone down.  Also discussed continued PPI use and why we did not start Carafate the other day as symptoms had improved quickly with PPI.  We discussed that further testing with CBC, heme testing of stool can help determine next step including whether or not he needs to see gastroenterology and possible endoscopy.  All of his questions were answered and understanding of plan expressed.  Please schedule appointment for acute patient visit tomorrow afternoon (Thursday) or if that is not available the acute visit that is open on Friday.  Office visit with me for repeat CBC, exam and Hemoccult testing. Thanks.

## 2022-03-07 NOTE — Telephone Encounter (Signed)
Sent message to DR Greene  

## 2022-03-07 NOTE — Telephone Encounter (Signed)
Spoke to pt and Explained Dr Vonna Kotyk message from his lab results . HE is not happy and wanting to come in with in 2 days as Dr Carlota Raspberry suggest . He is stating that he did not give him anything to change things. He states there is not blood in his stool .

## 2022-03-08 ENCOUNTER — Encounter: Payer: Self-pay | Admitting: Family Medicine

## 2022-03-08 ENCOUNTER — Ambulatory Visit (INDEPENDENT_AMBULATORY_CARE_PROVIDER_SITE_OTHER): Payer: BC Managed Care – PPO | Admitting: Family Medicine

## 2022-03-08 VITALS — BP 120/70 | HR 85 | Temp 98.0°F | Resp 16 | Ht 75.0 in | Wt 264.8 lb

## 2022-03-08 DIAGNOSIS — E1129 Type 2 diabetes mellitus with other diabetic kidney complication: Secondary | ICD-10-CM

## 2022-03-08 DIAGNOSIS — R809 Proteinuria, unspecified: Secondary | ICD-10-CM

## 2022-03-08 DIAGNOSIS — R5383 Other fatigue: Secondary | ICD-10-CM | POA: Diagnosis not present

## 2022-03-08 DIAGNOSIS — R1013 Epigastric pain: Secondary | ICD-10-CM

## 2022-03-08 DIAGNOSIS — D649 Anemia, unspecified: Secondary | ICD-10-CM

## 2022-03-08 LAB — IFOBT (OCCULT BLOOD): IFOBT: NEGATIVE

## 2022-03-08 NOTE — Progress Notes (Signed)
Subjective:  Patient ID: David Crane, male    DOB: 1965-12-29  Age: 56 y.o. MRN: [id number]  CC:  Chief Complaint  Patient presents with   Abdominal Pain    Pt here for recheck     HPI David Crane presents for   Epigastric pain, anemia.   See phone note.  Hemoglobin decreased slightly from previous reading but he has not had any return of epigastric pain and continue to take Protonix daily.  Initial nausea when seen by my colleague but no true vomiting.  No dark stools, melena/hematochezia.  No new fatigue, abdominal pain  resolved since a few days of starting Protonix.  David Crane has been working well, blood sugars have been today, stopped glipizide, no symptomatic lows.  History Patient Active Problem List   Diagnosis Date Noted   Diabetic nephropathy ([place]) 05/01/2017   Diabetes ([place]) 05/01/2017   Peripheral neuropathy 05/01/2017   Past Medical History:  Diagnosis Date   Diabetes mellitus without complication ([place])    GERD (gastroesophageal reflux disease)    Hyperlipidemia    Hypertension    Past Surgical History:  Procedure Laterality Date   Growth removed Left    TONSILLECTOMY     WISDOM TOOTH EXTRACTION     No Known Allergies Prior to Admission medications   Medication Sig Start Date End Date Taking? Authorizing Provider  aspirin EC 81 MG tablet Take 1 tablet (81 mg total) by mouth daily. Swallow whole. 06/15/20  Yes O'Neal, Cassie Freer, MD  atorvastatin (LIPITOR) 40 MG tablet Take 1 tablet by mouth once daily 12/04/21  Yes O'Neal, Cassie Freer, MD  benzonatate (TESSALON) 200 MG capsule Take 1 capsule (200 mg total) by mouth 2 (two) times daily as needed for cough. 01/29/22  Yes Maximiano Coss, NP  blood glucose meter kit and supplies Dispense based on patient and insurance preference. Check twice per day. FOR ICD-9 250.00, 250.01). 04/29/17  Yes Wendie Agreste, MD  empagliflozin (JARDIANCE) 10 MG TABS tablet Take 1 tablet (10 mg total) by mouth daily  before breakfast. 03/05/22  Yes Wendie Agreste, MD  gabapentin (NEURONTIN) 300 MG capsule Take 1-2 capsules (300-600 mg total) by mouth 3 (three) times daily as needed. 09/12/21  Yes Wendie Agreste, MD  glipiZIDE (GLUCOTROL) 5 MG tablet TAKE 1 TABLET BY MOUTH TWICE DAILY BEFORE A MEAL 10/30/21  Yes Wendie Agreste, MD  glucose blood test strip Use as instructed 11/16/20  Yes Wendie Agreste, MD  Lancets 30G MISC Check sugar twice a day before meals 01/02/19  Yes Wendie Agreste, MD  metFORMIN (GLUCOPHAGE) 1000 MG tablet Take 1 tablet (1,000 mg total) by mouth 2 (two) times daily with a meal. 12/13/21  Yes Wendie Agreste, MD  olmesartan (BENICAR) 5 MG tablet Take 1 tablet (5 mg total) by mouth daily. 12/13/21  Yes Wendie Agreste, MD  pantoprazole (PROTONIX) 40 MG tablet Take 1 tablet (40 mg total) by mouth daily. 02/21/22  Yes Maximiano Coss, NP   Social History   Socioeconomic History   Marital status: Married    Spouse name: Not on file   Number of children: 2   Years of education: Not on file   Highest education level: Not on file  Occupational History   Not on file  Tobacco Use   Smoking status: Never   Smokeless tobacco: Never  Vaping Use   Vaping Use: Never used  Substance and Sexual Activity   Alcohol use: Yes  Comment: occ   Drug use: No   Sexual activity: Yes  Other Topics Concern   Not on file  Social History Narrative   Not on file   Social Determinants of Health   Financial Resource Strain: Not on file  Food Insecurity: Not on file  Transportation Needs: Not on file  Physical Activity: Not on file  Stress: Not on file  Social Connections: Not on file  Intimate Partner Violence: Not on file    Review of Systems Per HPI  Objective:   Vitals:   03/08/22 1547  BP: 120/70  Pulse: 85  Resp: 16  Temp: 98 F (36.7 C)  TempSrc: Temporal  SpO2: 97%  Weight: 264 lb 12.8 oz (120.1 kg)  Height: _0  (1.905 m)     Physical Exam Vitals  reviewed.  Constitutional:      Appearance: He is well-developed.  HENT:     Head: Normocephalic and atraumatic.  Neck:     Vascular: No carotid bruit or JVD.  Cardiovascular:     Rate and Rhythm: Normal rate and regular rhythm.     Heart sounds: Normal heart sounds. No murmur heard. Pulmonary:     Effort: Pulmonary effort is normal.     Breath sounds: Normal breath sounds. No rales.  Abdominal:     General: Abdomen is flat. There is no distension.     Tenderness: There is no abdominal tenderness. There is no guarding.  Genitourinary:    Comments: DRE performed, minimal stool, no dark stool or blood appreciated.  Hemoccult testing negative. Musculoskeletal:     Right lower leg: No edema.     Left lower leg: No edema.  Skin:    General: Skin is warm and dry.  Neurological:     Mental Status: He is alert and oriented to person, place, and time.  Psychiatric:        Mood and Affect: Mood normal.        Assessment & Plan:  David Crane is a 56 y.o. male . Epigastric pain - Plan: CBC, IFOBT POC (occult bld, rslt in office), IFOBT POC (occult bld, rslt in office)  Anemia, unspecified type - Plan: CBC, IFOBT POC (occult bld, rslt in office), IFOBT POC (occult bld, rslt in office)  Type 2 diabetes mellitus with microalbuminuria, without long-term current use of insulin (HCC)  Fatigue, unspecified type  Overall stable symptoms, has not had recurrence of abdominal pain.  Question of gastritis versus peptic ulcer disease initially but with improved symptoms will continue PPI for now.  Heme-negative today.  Repeat CBC.  If further decline may need to consult gastroenterology for possible EGD.  Otherwise recheck in 2 weeks with ER/RTC precautions if symptomatic.  Continue Jardiance, stop glipizide for now.  No orders of the defined types were placed in this encounter.  Patient Instructions  Good news, test for blood in the stool was negative/normal today.  Continue Protonix  once per day, I will let you know results of blood counts the next few days.  Continue Jardiance and hold glipizide for now.  Follow-up in the next 2 weeks to decide next step and whether or not to continue the Protonix.  We will likely repeat blood count at that time.  If any new fatigue, dark stools or abdominal pain be seen right away.     Signed,   [name], MD [facility] 03/08/22 4:57 PM

## 2022-03-08 NOTE — Telephone Encounter (Signed)
Patient was scheduled for a appointment today.

## 2022-03-08 NOTE — Patient Instructions (Signed)
Good news, test for blood in the stool was negative/normal today.  Continue Protonix once per day, I will let you know results of blood counts the next few days.  Continue Jardiance and hold glipizide for now.  Follow-up in the next 2 weeks to decide next step and whether or not to continue the Protonix.  We will likely repeat blood count at that time.  If any new fatigue, dark stools or abdominal pain be seen right away.

## 2022-03-09 LAB — CBC
HCT: 35.8 % — ABNORMAL LOW (ref 39.0–52.0)
Hemoglobin: 11.9 g/dL — ABNORMAL LOW (ref 13.0–17.0)
MCHC: 33.2 g/dL (ref 30.0–36.0)
MCV: 88.2 fl (ref 78.0–100.0)
Platelets: 244 10*3/uL (ref 150.0–400.0)
RBC: 4.06 Mil/uL — ABNORMAL LOW (ref 4.22–5.81)
RDW: 13.8 % (ref 11.5–15.5)
WBC: 6.1 10*3/uL (ref 4.0–10.5)

## 2022-03-19 ENCOUNTER — Encounter: Payer: Self-pay | Admitting: Family Medicine

## 2022-03-19 NOTE — Telephone Encounter (Signed)
Pt is asking about medication to coat the stomach due to ulcer

## 2022-03-20 MED ORDER — SUCRALFATE 1 G PO TABS: 1.0000 g | ORAL_TABLET | Freq: Three times a day (TID) | ORAL | 0 refills | Status: DC

## 2022-03-26 ENCOUNTER — Encounter: Payer: Self-pay | Admitting: Family Medicine

## 2022-03-26 ENCOUNTER — Ambulatory Visit (INDEPENDENT_AMBULATORY_CARE_PROVIDER_SITE_OTHER): Payer: BC Managed Care – PPO | Admitting: Family Medicine

## 2022-03-26 VITALS — BP 116/70 | HR 100 | Temp 98.1°F | Resp 16 | Ht 75.0 in | Wt 261.4 lb

## 2022-03-26 DIAGNOSIS — R1013 Epigastric pain: Secondary | ICD-10-CM | POA: Diagnosis not present

## 2022-03-26 DIAGNOSIS — R739 Hyperglycemia, unspecified: Secondary | ICD-10-CM

## 2022-03-26 DIAGNOSIS — R809 Proteinuria, unspecified: Secondary | ICD-10-CM | POA: Diagnosis not present

## 2022-03-26 DIAGNOSIS — D649 Anemia, unspecified: Secondary | ICD-10-CM | POA: Diagnosis not present

## 2022-03-26 DIAGNOSIS — E1129 Type 2 diabetes mellitus with other diabetic kidney complication: Secondary | ICD-10-CM | POA: Diagnosis not present

## 2022-03-26 DIAGNOSIS — G629 Polyneuropathy, unspecified: Secondary | ICD-10-CM | POA: Diagnosis not present

## 2022-03-26 DIAGNOSIS — Z7984 Long term (current) use of oral hypoglycemic drugs: Secondary | ICD-10-CM

## 2022-03-26 DIAGNOSIS — E1142 Type 2 diabetes mellitus with diabetic polyneuropathy: Secondary | ICD-10-CM

## 2022-03-26 LAB — GLUCOSE, POCT (MANUAL RESULT ENTRY): POC Glucose: 120 mg/dl — AB (ref 70–99)

## 2022-03-26 MED ORDER — GABAPENTIN 300 MG PO CAPS: 300.0000 mg | ORAL_CAPSULE | Freq: Three times a day (TID) | ORAL | 1 refills | Status: DC | PRN

## 2022-03-26 MED ORDER — LANCETS 30G MISC: 2 refills | Status: AC

## 2022-03-26 MED ORDER — METFORMIN HCL 1000 MG PO TABS: 1000.0000 mg | ORAL_TABLET | Freq: Two times a day (BID) | ORAL | 2 refills | Status: DC

## 2022-03-26 MED ORDER — GLUCOSE BLOOD VI STRP: ORAL_STRIP | 12 refills | Status: AC

## 2022-03-26 MED ORDER — EMPAGLIFLOZIN 10 MG PO TABS: 10.0000 mg | ORAL_TABLET | Freq: Every day | ORAL | 2 refills | Status: DC

## 2022-03-26 MED ORDER — GLIPIZIDE 5 MG PO TABS: 5.0000 mg | ORAL_TABLET | Freq: Two times a day (BID) | ORAL | 1 refills | Status: DC

## 2022-03-26 MED ORDER — OLMESARTAN MEDOXOMIL 5 MG PO TABS: 5.0000 mg | ORAL_TABLET | Freq: Every day | ORAL | 3 refills | Status: DC

## 2022-03-26 NOTE — Patient Instructions (Addendum)
Avoid nsaids like ibuprofen for now. Tylenol is ok.  Continue carafate for now and pantoprazole. I will refer you to gastroenterology to discuss these symptoms and plan further. Return to the clinic or go to the nearest emergency room if any of your symptoms worsen or new symptoms occur.  Try taking glipizide 2.'5mg'$  twice per day everyday, then if readings are running higher, can return to '5mg'$  twice per day. Let me know if there are questions. recehck A1c and other labs in 2 months.

## 2022-03-26 NOTE — Progress Notes (Signed)
Subjective:  Patient ID: David Crane, male    DOB: 12-28-1965  Age: 56 y.o. MRN: [id number]  CC:  Chief Complaint  Patient presents with   Diabetes   Abdominal Pain    Pt reports has been better with new med     HPI David Crane presents for   Abdominal pain/epigastric pain Last visit June 22.  See prior notes.  Symptoms were improved on PPI.  Borderline anemia.  Question of gastritis versus PUD but if improved symptoms continue PPI.  Heme-negative stool on his June 22 visit.  Repeat CBC stable with hemoglobin 11.9.  See recent phone note, some return of discomfort  - minimal on 7/1-7/2 added Carafate on July 4. Pain resolved with starting carafate.  Ibuprofen 673m most days prior for knee pain. Last dose last night - only once in past week.   Diabetes: Complicated by peripheral neuropathy, microalbuminuria, retinopathy, followed by retinal specialist.  Previously was on metformin 1000 mg twice daily, glipizide 5 mg twice daily.  Jardiance added for improved control after A1c elevation June 7, thought to be possibly contributing to some of his fatigue.  Home readings were improving, so he stopped glipizide as of his last visit June 22. He has had some elevated readings - 155-170, has taken glipizide.  114 last night - did not take. Skipped about 3-4 doses past week.  No sx lows. Lowest 106.  Taking jardiance daily. Metformin BID.  On ARB and statin.  Gabapentin 1 in am, 2 at bedtime working well.   Microalbumin: 2.1 on 12/13/21.  Optho appt later this year, retina specialist monthly.  Lab Results  Component Value Date   HGBA1C 8.3 (H) 02/21/2022   HGBA1C 7.9 (H) 12/08/2021   HGBA1C 6.8 09/06/2021   Lab Results  Component Value Date   MICROALBUR 2.1 (H) 12/13/2021   LDLCALC 44 12/08/2021   CREATININE 0.86 02/21/2022      History Patient Active Problem List   Diagnosis Date Noted   Diabetic nephropathy ([facility] 05/01/2017   Diabetes ([facility] 05/01/2017   Peripheral  neuropathy 05/01/2017   Past Medical History:  Diagnosis Date   Diabetes mellitus without complication (HCC)    GERD (gastroesophageal reflux disease)    Hyperlipidemia    Hypertension    Past Surgical History:  Procedure Laterality Date   Growth removed Left    TONSILLECTOMY     WISDOM TOOTH EXTRACTION     No Known Allergies Prior to Admission medications   Medication Sig Start Date End Date Taking? Authorizing Provider  aspirin EC 81 MG tablet Take 1 tablet (81 mg total) by mouth daily. Swallow whole. 06/15/20  Yes O'Neal, WCassie Freer MD  atorvastatin (LIPITOR) 40 MG tablet Take 1 tablet by mouth once daily 12/04/21  Yes O'Neal, WCassie Freer MD  blood glucose meter kit and supplies Dispense based on patient and insurance preference. Check twice per day. FOR ICD-9 250.00, 250.01). 04/29/17  Yes GWendie Agreste MD  empagliflozin (JARDIANCE) 10 MG TABS tablet Take 1 tablet (10 mg total) by mouth daily before breakfast. 03/05/22  Yes GWendie Agreste MD  gabapentin (NEURONTIN) 300 MG capsule Take 1-2 capsules (300-600 mg total) by mouth 3 (three) times daily as needed. 09/12/21  Yes GWendie Agreste MD  glipiZIDE (GLUCOTROL) 5 MG tablet TAKE 1 TABLET BY MOUTH TWICE DAILY BEFORE A MEAL 10/30/21  Yes GWendie Agreste MD  glucose blood test strip Use as instructed 11/16/20  Yes GWendie Agreste  MD  Lancets 30G MISC Check sugar twice a day before meals 01/02/19  Yes Wendie Agreste, MD  metFORMIN (GLUCOPHAGE) 1000 MG tablet Take 1 tablet (1,000 mg total) by mouth 2 (two) times daily with a meal. 12/13/21  Yes Wendie Agreste, MD  olmesartan (BENICAR) 5 MG tablet Take 1 tablet (5 mg total) by mouth daily. 12/13/21  Yes Wendie Agreste, MD  pantoprazole (PROTONIX) 40 MG tablet Take 1 tablet (40 mg total) by mouth daily. 02/21/22  Yes Maximiano Coss, NP  sucralfate (CARAFATE) 1 g tablet Take 1 tablet (1 g total) by mouth 4 (four) times daily -  with meals and at bedtime. 03/20/22  Yes  Wendie Agreste, MD   Social History   Socioeconomic History   Marital status: Married    Spouse name: Not on file   Number of children: 2   Years of education: Not on file   Highest education level: Not on file  Occupational History   Not on file  Tobacco Use   Smoking status: Never   Smokeless tobacco: Never  Vaping Use   Vaping Use: Never used  Substance and Sexual Activity   Alcohol use: Yes    Comment: occ   Drug use: No   Sexual activity: Yes  Other Topics Concern   Not on file  Social History Narrative   Not on file   Social Determinants of Health   Financial Resource Strain: Not on file  Food Insecurity: Not on file  Transportation Needs: Not on file  Physical Activity: Not on file  Stress: Not on file  Social Connections: Not on file  Intimate Partner Violence: Not on file    Review of Systems  Constitutional:  Negative for fatigue and unexpected weight change.  Eyes:  Negative for visual disturbance.  Respiratory:  Negative for cough, chest tightness and shortness of breath.   Cardiovascular:  Negative for chest pain, palpitations and leg swelling.  Gastrointestinal:  Negative for abdominal pain and blood in stool.  Neurological:  Negative for dizziness, light-headedness and headaches.     Objective:   Vitals:   03/26/22 1612  BP: 116/70  Pulse: 100  Resp: 16  Temp: 98.1 F (36.7 C)  TempSrc: Temporal  SpO2: 95%  Weight: 261 lb 6.4 oz (118.6 kg)  Height: _0  (1.905 m)     Physical Exam Vitals reviewed.  Constitutional:      Appearance: He is well-developed.  HENT:     Head: Normocephalic and atraumatic.  Neck:     Vascular: No carotid bruit or JVD.  Cardiovascular:     Rate and Rhythm: Normal rate and regular rhythm.     Heart sounds: Normal heart sounds. No murmur heard. Pulmonary:     Effort: Pulmonary effort is normal.     Breath sounds: Normal breath sounds. No rales.  Abdominal:     General: Abdomen is flat. There is no  distension.     Palpations: Abdomen is soft.     Tenderness: There is no abdominal tenderness. There is no guarding.  Musculoskeletal:     Right lower leg: No edema.     Left lower leg: No edema.  Skin:    General: Skin is warm and dry.  Neurological:     Mental Status: He is alert and oriented to person, place, and time.  Psychiatric:        Mood and Affect: Mood normal.        Assessment &  Plan:  David Crane is a 56 y.o. male . Epigastric pain Anemia, unspecified type - Plan: Ambulatory referral to Gastroenterology, CBC  -Improved on Carafate.  Differential includes gastritis versus PUD.  Repeat CBC ordered with prior borderline anemia.  Referred to gastroenterology to decide on further testing or EGD with ER precautions given.  Avoid NSAIDs for now.  Peripheral polyneuropathy - Plan: gabapentin (NEURONTIN) 300 MG capsule  -Stable on current dose gabapentin, continue same  Type 2 diabetes mellitus with microalbuminuria, without long-term current use of insulin (HCC) - Plan: glipiZIDE (GLUCOTROL) 5 MG tablet, metFORMIN (GLUCOPHAGE) 1000 MG tablet, olmesartan (BENICAR) 5 MG tablet, empagliflozin (JARDIANCE) 10 MG TABS tablet, Lancets 30G MISC, glucose blood test strip, POCT glucose (manual entry) Hyperglycemia - Plan: empagliflozin (JARDIANCE) 10 MG TABS tablet  -With some lower readings will try glipizide 2.5 mg twice daily consistent dosing to lessen variability.  Continue other medications same doses for now.  In office glucose overall reassuring.  Recheck next few months for A1c and other labs.  RTC precautions  Meds ordered this encounter  Medications   gabapentin (NEURONTIN) 300 MG capsule    Sig: Take 1-2 capsules (300-600 mg total) by mouth 3 (three) times daily as needed.    Dispense:  540 capsule    Refill:  1   glipiZIDE (GLUCOTROL) 5 MG tablet    Sig: Take 1 tablet (5 mg total) by mouth 2 (two) times daily before a meal.    Dispense:  180 tablet    Refill:  1    metFORMIN (GLUCOPHAGE) 1000 MG tablet    Sig: Take 1 tablet (1,000 mg total) by mouth 2 (two) times daily with a meal.    Dispense:  180 tablet    Refill:  2   olmesartan (BENICAR) 5 MG tablet    Sig: Take 1 tablet (5 mg total) by mouth daily.    Dispense:  30 tablet    Refill:  3   empagliflozin (JARDIANCE) 10 MG TABS tablet    Sig: Take 1 tablet (10 mg total) by mouth daily before breakfast.    Dispense:  30 tablet    Refill:  2   Lancets 30G MISC    Sig: Check sugar twice a day before meals    Dispense:  200 each    Refill:  2    One Touch Verio-flex   glucose blood test strip    Sig: Use as instructed    Dispense:  100 each    Refill:  12    One Touch Verio   Patient Instructions  Avoid nsaids like ibuprofen for now. Tylenol is ok.  Continue carafate for now and pantoprazole. I will refer you to gastroenterology to discuss these symptoms and plan further. Return to the clinic or go to the nearest emergency room if any of your symptoms worsen or new symptoms occur.  Try taking glipizide 2.60m twice per day everyday, then if readings are running higher, can return to 567mtwice per day. Let me know if there are questions. recehck A1c and other labs in 2 months.     Signed,   JeMerri RayMD [facility] 03/26/22 10:19 PM

## 2022-03-27 LAB — CBC
HCT: 37.5 % — ABNORMAL LOW (ref 39.0–52.0)
Hemoglobin: 12.4 g/dL — ABNORMAL LOW (ref 13.0–17.0)
MCHC: 33.1 g/dL (ref 30.0–36.0)
MCV: 87.5 fl (ref 78.0–100.0)
Platelets: 253 10*3/uL (ref 150.0–400.0)
RBC: 4.28 Mil/uL (ref 4.22–5.81)
RDW: 14 % (ref 11.5–15.5)
WBC: 7.7 10*3/uL (ref 4.0–10.5)

## 2022-03-30 ENCOUNTER — Other Ambulatory Visit: Payer: Self-pay | Admitting: Family Medicine

## 2022-04-09 DIAGNOSIS — E113313 Type 2 diabetes mellitus with moderate nonproliferative diabetic retinopathy with macular edema, bilateral: Secondary | ICD-10-CM | POA: Diagnosis not present

## 2022-04-15 ENCOUNTER — Other Ambulatory Visit: Payer: Self-pay | Admitting: Family Medicine

## 2022-04-16 ENCOUNTER — Encounter: Payer: Self-pay | Admitting: Family Medicine

## 2022-04-16 MED ORDER — SUCRALFATE 1 G PO TABS: 1.0000 g | ORAL_TABLET | Freq: Three times a day (TID) | ORAL | 3 refills | Status: DC

## 2022-04-16 NOTE — Telephone Encounter (Signed)
Is this okay to do 30 days ?

## 2022-04-17 NOTE — Telephone Encounter (Signed)
Carafate is for short-term treatment for possible gastritis or ulcer.  My understanding at his July 10 visit was that we were going to be having him seen by gastroenterology fairly soon.  I do not see an appointment scheduled.  Anyway we can get him seen within the next 1 week?  If he is having continued discomfort then needs to repeat CBC to make sure that is stable.

## 2022-05-10 DIAGNOSIS — E113311 Type 2 diabetes mellitus with moderate nonproliferative diabetic retinopathy with macular edema, right eye: Secondary | ICD-10-CM | POA: Diagnosis not present

## 2022-05-10 DIAGNOSIS — H3563 Retinal hemorrhage, bilateral: Secondary | ICD-10-CM | POA: Diagnosis not present

## 2022-05-10 DIAGNOSIS — H43821 Vitreomacular adhesion, right eye: Secondary | ICD-10-CM | POA: Diagnosis not present

## 2022-05-10 DIAGNOSIS — H3582 Retinal ischemia: Secondary | ICD-10-CM | POA: Diagnosis not present

## 2022-05-18 ENCOUNTER — Other Ambulatory Visit: Payer: Self-pay

## 2022-05-18 DIAGNOSIS — R1013 Epigastric pain: Secondary | ICD-10-CM

## 2022-05-18 MED ORDER — PANTOPRAZOLE SODIUM 40 MG PO TBEC: 40.0000 mg | DELAYED_RELEASE_TABLET | Freq: Every day | ORAL | 0 refills | Status: DC

## 2022-05-30 ENCOUNTER — Ambulatory Visit (INDEPENDENT_AMBULATORY_CARE_PROVIDER_SITE_OTHER): Payer: BC Managed Care – PPO | Admitting: Family Medicine

## 2022-05-30 ENCOUNTER — Encounter: Payer: Self-pay | Admitting: Family Medicine

## 2022-05-30 VITALS — BP 118/74 | HR 93 | Temp 97.9°F | Ht 75.0 in | Wt 257.6 lb

## 2022-05-30 DIAGNOSIS — E785 Hyperlipidemia, unspecified: Secondary | ICD-10-CM

## 2022-05-30 DIAGNOSIS — E1129 Type 2 diabetes mellitus with other diabetic kidney complication: Secondary | ICD-10-CM | POA: Diagnosis not present

## 2022-05-30 DIAGNOSIS — R1013 Epigastric pain: Secondary | ICD-10-CM

## 2022-05-30 DIAGNOSIS — D649 Anemia, unspecified: Secondary | ICD-10-CM | POA: Diagnosis not present

## 2022-05-30 DIAGNOSIS — G629 Polyneuropathy, unspecified: Secondary | ICD-10-CM

## 2022-05-30 DIAGNOSIS — R809 Proteinuria, unspecified: Secondary | ICD-10-CM

## 2022-05-30 DIAGNOSIS — M545 Low back pain, unspecified: Secondary | ICD-10-CM

## 2022-05-30 DIAGNOSIS — E1165 Type 2 diabetes mellitus with hyperglycemia: Secondary | ICD-10-CM | POA: Diagnosis not present

## 2022-05-30 DIAGNOSIS — R739 Hyperglycemia, unspecified: Secondary | ICD-10-CM

## 2022-05-30 MED ORDER — PANTOPRAZOLE SODIUM 40 MG PO TBEC: 40.0000 mg | DELAYED_RELEASE_TABLET | Freq: Every day | ORAL | 0 refills | Status: DC

## 2022-05-30 MED ORDER — GABAPENTIN 300 MG PO CAPS: 300.0000 mg | ORAL_CAPSULE | Freq: Three times a day (TID) | ORAL | 1 refills | Status: DC | PRN

## 2022-05-30 MED ORDER — GLIPIZIDE 5 MG PO TABS: 5.0000 mg | ORAL_TABLET | Freq: Two times a day (BID) | ORAL | 1 refills | Status: DC

## 2022-05-30 MED ORDER — METHOCARBAMOL 500 MG PO TABS: 500.0000 mg | ORAL_TABLET | Freq: Three times a day (TID) | ORAL | 0 refills | Status: DC | PRN

## 2022-05-30 MED ORDER — EMPAGLIFLOZIN 10 MG PO TABS: 10.0000 mg | ORAL_TABLET | Freq: Every day | ORAL | 1 refills | Status: DC

## 2022-05-30 MED ORDER — METFORMIN HCL 1000 MG PO TABS: 1000.0000 mg | ORAL_TABLET | Freq: Two times a day (BID) | ORAL | 2 refills | Status: DC

## 2022-05-30 MED ORDER — ATORVASTATIN CALCIUM 40 MG PO TABS: 40.0000 mg | ORAL_TABLET | Freq: Every day | ORAL | 2 refills | Status: DC

## 2022-05-30 NOTE — Patient Instructions (Addendum)
I will check into gastroenterology referral. No med changes today.  Lab only visit (preferably fasting) at [facility] in 8:30-4:30 during weekdays, no appointment needed [place].  [place], [place] [address]  Muscle relaxer refilled temporarily.  If back pain is not continuing to improve - discuss with your employer or I am happy to see you if needed.    As we discussed at the end of your visit - olmesartan low dose is for the protein that is spilled from the kidneys. You can try taking that on a weekend to see if tolerated. If you do feel new side effects, stop it right away. Let me know if there are other questions.   Acute Back Pain, Adult Acute back pain is sudden and usually short-lived. It is often caused by an injury to the muscles and tissues in the back. The injury may result from: A muscle, tendon, or ligament getting overstretched or torn. Ligaments are tissues that connect bones to each other. Lifting something improperly can cause a back strain. Wear and tear (degeneration) of the spinal disks. Spinal disks are circular tissue that provide cushioning between the bones of the spine (vertebrae). Twisting motions, such as while playing sports or doing yard work. A hit to the back. Arthritis. You may have a physical exam, lab tests, and imaging tests to find the cause of your pain. Acute back pain usually goes away with rest and home care. Follow these instructions at home: Managing pain, stiffness, and swelling Take over-the-counter and prescription medicines only as told by your health care provider. Treatment may include medicines for pain and inflammation that are taken by mouth or applied to the skin, or muscle relaxants. Your health care provider may recommend applying ice during the first 24-48 hours after your pain starts. To do this: Put ice in a plastic bag. Place a towel between your skin and the bag. Leave the ice on for 20 minutes, 2-3 times a  day. Remove the ice if your skin turns bright red. This is very important. If you cannot feel pain, heat, or cold, you have a greater risk of damage to the area. If directed, apply heat to the affected area as often as told by your health care provider. Use the heat source that your health care provider recommends, such as a moist heat pack or a heating pad. Place a towel between your skin and the heat source. Leave the heat on for 20-30 minutes. Remove the heat if your skin turns bright red. This is especially important if you are unable to feel pain, heat, or cold. You have a greater risk of getting burned. Activity  Do not stay in bed. Staying in bed for more than 1-2 days can delay your recovery. Sit up and stand up straight. Avoid leaning forward when you sit or hunching over when you stand. If you work at a desk, sit close to it so you do not need to lean over. Keep your chin tucked in. Keep your neck drawn back, and keep your elbows bent at a 90-degree angle (right angle). Sit high and close to the steering wheel when you drive. Add lower back (lumbar) support to your car seat, if needed. Take short walks on even surfaces as soon as you are able. Try to increase the length of time you walk each day. Do not sit, drive, or stand in one place for more than 30 minutes at a time. Sitting or standing  for long periods of time can put stress on your back. Do not drive or use heavy machinery while taking prescription pain medicine. Use proper lifting techniques. When you bend and lift, use positions that put less stress on your back: [place] your knees. Keep the load close to your body. Avoid twisting. Exercise regularly as told by your health care provider. Exercising helps your back heal faster and helps prevent back injuries by keeping muscles strong and flexible. Work with a physical therapist to make a safe exercise program, as recommended by your health care provider. Do any exercises as told by  your physical therapist. Lifestyle Maintain a healthy weight. Extra weight puts stress on your back and makes it difficult to have good posture. Avoid activities or situations that make you feel anxious or stressed. Stress and anxiety increase muscle tension and can make back pain worse. Learn ways to manage anxiety and stress, such as through exercise. General instructions Sleep on a firm mattress in a comfortable position. Try lying on your side with your knees slightly bent. If you lie on your back, put a pillow under your knees. Keep your head and neck in a straight line with your spine (neutral position) when using electronic equipment like smartphones or pads. To do this: Raise your smartphone or pad to look at it instead of bending your head or neck to look down. Put the smartphone or pad at the level of your face while looking at the screen. Follow your treatment plan as told by your health care provider. This may include: Cognitive or behavioral therapy. Acupuncture or massage therapy. Meditation or yoga. Contact a health care provider if: You have pain that is not relieved with rest or medicine. You have increasing pain going down into your legs or buttocks. Your pain does not improve after 2 weeks. You have pain at night. You lose weight without trying. You have a fever or chills. You develop nausea or vomiting. You develop abdominal pain. Get help right away if: You develop new bowel or bladder control problems. You have unusual weakness or numbness in your arms or legs. You feel faint. These symptoms may represent a serious problem that is an emergency. Do not wait to see if the symptoms will go away. Get medical help right away. Call your local emergency services (911 in the [place] Do not drive yourself to the hospital. Summary Acute back pain is sudden and usually short-lived. Use proper lifting techniques. When you bend and lift, use positions that put less stress on your  back. Take over-the-counter and prescription medicines only as told by your health care provider, and apply heat or ice as told. This information is not intended to replace advice given to you by your health care provider. Make sure you discuss any questions you have with your health care provider. Document Revised: 11/25/2020 Document Reviewed: 11/25/2020 Elsevier Patient Education  [place].

## 2022-05-30 NOTE — Progress Notes (Signed)
Subjective:  Patient ID: David Crane, male    DOB: 03-May-1966  Age: 56 y.o. MRN: [id number]  CC:  Chief Complaint  Patient presents with   Diabetes   Back Pain    Pt states he pulled his back at work and wants something for pain    HPI PARDEEP PAUTZ presents for   Diabetes: Complicated by peripheral neuropathy, microalbuminuria, retinopathy followed by retinal specialist.  Treated with metformin 1000 mg twice daily, he is on statin as well as gabapentin for neuropathic symptoms.  Has not tolerated ACE inhibitor due to fatigue. Decreased control in June, added Jardiance 10 mg daily.  Glipizide was discontinued, then some increased readings - now taking 2.37m glipizide BID.  Home readings fasting: 146 Postprandial: 143.   Has not tried olmesartan for microalbuminuria. Concerned about blood pressure  Gabapentin 1 in the morning, 2 at night., occasional afternoon dose. Doing well.  Microalbumin: 2.1 on 12/13/21 Optho, foot exam, pneumovax: Up-to-date Last ate 4 hrs ago.  Staying active with work.  Wt Readings from Last 3 Encounters:  05/30/22 257 lb 9.6 oz (116.8 kg)  03/26/22 261 lb 6.4 oz (118.6 kg)  03/08/22 264 lb 12.8 oz (120.1 kg)     Lab Results  Component Value Date   HGBA1C 8.3 (H) 02/21/2022   HGBA1C 7.9 (H) 12/08/2021   HGBA1C 6.8 09/06/2021   Lab Results  Component Value Date   MICROALBUR 2.1 (H) 12/13/2021   LDLCALC 44 12/08/2021   CREATININE 0.86 02/21/2022   Low back pain: At work 2 days ago - bending down to pick something up. Tight feeling in lower mid back. Felt like spasm. Took leftover robaxin TID - helped spasm.  No bowel or bladder incontinence, no saddle anesthesia, no lower extremity weakness. No fever/nt sweats or weight loss.  Able to work yesterday and today. Improving with robaxin and tylneol - ran out robaxin. No sedation on this combo.   Epigastric pain: See prior notes. Plan to follow up with GI. He has not received call. Has  continued taking carafate with every meal and before bedtime.  On protonix once per day. Has refill of carafate if needed.  Last felt abdominal pain few weeks ago.  No melena, hematochezia.     History Patient Active Problem List   Diagnosis Date Noted   Diabetic nephropathy ([place] 05/01/2017   Diabetes ([facility] 05/01/2017   Peripheral neuropathy 05/01/2017   Past Medical History:  Diagnosis Date   Diabetes mellitus without complication ([facility]    GERD (gastroesophageal reflux disease)    Hyperlipidemia    Hypertension    Past Surgical History:  Procedure Laterality Date   Growth removed Left    TONSILLECTOMY     WISDOM TOOTH EXTRACTION     No Known Allergies Prior to Admission medications   Medication Sig Start Date End Date Taking? Authorizing Provider  aspirin EC 81 MG tablet Take 1 tablet (81 mg total) by mouth daily. Swallow whole. 06/15/20  Yes O'Neal, WCassie Freer MD  atorvastatin (LIPITOR) 40 MG tablet Take 1 tablet by mouth once daily 12/04/21  Yes O'Neal, WCassie Freer MD  blood glucose meter kit and supplies Dispense based on patient and insurance preference. Check twice per day. FOR ICD-9 250.00, 250.01). 04/29/17  Yes GWendie Agreste MD  empagliflozin (JARDIANCE) 10 MG TABS tablet Take 1 tablet (10 mg total) by mouth daily before breakfast. 03/26/22  Yes GWendie Agreste MD  gabapentin (NEURONTIN) 300 MG capsule Take 1-2  capsules (300-600 mg total) by mouth 3 (three) times daily as needed. 03/26/22  Yes Wendie Agreste, MD  glipiZIDE (GLUCOTROL) 5 MG tablet Take 1 tablet (5 mg total) by mouth 2 (two) times daily before a meal. 03/26/22  Yes Wendie Agreste, MD  glucose blood test strip Use as instructed 03/26/22  Yes Wendie Agreste, MD  Lancets 30G MISC Check sugar twice a day before meals 03/26/22  Yes Wendie Agreste, MD  metFORMIN (GLUCOPHAGE) 1000 MG tablet Take 1 tablet (1,000 mg total) by mouth 2 (two) times daily with a meal. 03/26/22  Yes Wendie Agreste, MD  pantoprazole (PROTONIX) 40 MG tablet Take 1 tablet (40 mg total) by mouth daily. 05/18/22  Yes Wendie Agreste, MD  sucralfate (CARAFATE) 1 g tablet Take 1 tablet (1 g total) by mouth 4 (four) times daily -  with meals and at bedtime. 04/16/22  Yes Wendie Agreste, MD  olmesartan (BENICAR) 5 MG tablet Take 1 tablet (5 mg total) by mouth daily. Patient not taking: Reported on 05/30/2022 03/26/22   Wendie Agreste, MD   Social History   Socioeconomic History   Marital status: Married    Spouse name: Not on file   Number of children: 2   Years of education: Not on file   Highest education level: Not on file  Occupational History   Not on file  Tobacco Use   Smoking status: Never   Smokeless tobacco: Never  Vaping Use   Vaping Use: Never used  Substance and Sexual Activity   Alcohol use: Yes    Comment: occ   Drug use: No   Sexual activity: Yes  Other Topics Concern   Not on file  Social History Narrative   Not on file   Social Determinants of Health   Financial Resource Strain: Not on file  Food Insecurity: Not on file  Transportation Needs: Not on file  Physical Activity: Not on file  Stress: Not on file  Social Connections: Not on file  Intimate Partner Violence: Not on file    Review of Systems  Constitutional:  Negative for fatigue and unexpected weight change.  Eyes:  Negative for visual disturbance.  Respiratory:  Negative for cough, chest tightness and shortness of breath.   Cardiovascular:  Negative for chest pain, palpitations and leg swelling.  Gastrointestinal:  Negative for abdominal pain and blood in stool.  Neurological:  Negative for dizziness, light-headedness and headaches.     Objective:   Vitals:   05/30/22 1637  BP: 118/74  Pulse: 93  Temp: 97.9 F (36.6 C)  SpO2: 96%  Weight: 257 lb 9.6 oz (116.8 kg)  Height: '6\' 3"'  (1.905 m)     Physical Exam Vitals reviewed.  Constitutional:      Appearance: He is well-developed.  HENT:      Head: Normocephalic and atraumatic.  Neck:     Vascular: No carotid bruit or JVD.  Cardiovascular:     Rate and Rhythm: Normal rate and regular rhythm.     Heart sounds: Normal heart sounds. No murmur heard. Pulmonary:     Effort: Pulmonary effort is normal.     Breath sounds: Normal breath sounds. No rales.  Musculoskeletal:     Right lower leg: No edema.     Left lower leg: No edema.     Comments: No midline bony tenderness, able to ambulate without difficulty, negative seated straight leg raise.  Skin:    General: Skin  is warm and dry.  Neurological:     Mental Status: He is alert and oriented to person, place, and time.  Psychiatric:        Mood and Affect: Mood normal.        Assessment & Plan:  ELON EOFF is a 56 y.o. male . Type 2 diabetes mellitus with hyperglycemia, without long-term current use of insulin (HCC) - Plan: Hemoglobin A1c Hyperglycemia - Plan: empagliflozin (JARDIANCE) 10 MG TABS tablet Type 2 diabetes mellitus with microalbuminuria, without long-term current use of insulin (HCC) - Plan: atorvastatin (LIPITOR) 40 MG tablet, empagliflozin (JARDIANCE) 10 MG TABS tablet, glipiZIDE (GLUCOTROL) 5 MG tablet, metFORMIN (GLUCOPHAGE) 1000 MG tablet, Comprehensive metabolic panel, Lipid panel  -Check labs with lab only visit, adjust medications accordingly, continue same dosing for now.  -Did not tolerate ACE inhibitor in the past, but does have history of microalbuminuria.  We discussed option of low-dose ARB previously, and this was discussed again today.  Option to try the low-dose ARB, specifically can try on the weekend in case he does have side effects and to advise me if he does not tolerate that medication.  Potential side effects and risks were discussed, but also with microalbuminuria potential benefit discussed.  Understanding expressed.  Hyperlipidemia, unspecified hyperlipidemia type - Plan: atorvastatin (LIPITOR) 40 MG tablet, Lipid panel  -Lab  only visit, fasting lipid panel.  Continue same dose statin for now, adjust meds accordingly based on results.  Peripheral polyneuropathy - Plan: gabapentin (NEURONTIN) 300 MG capsule  -Overall stable with current dosing with option of additional midday dose.  Continue gabapentin.  Epigastric pain - Plan: pantoprazole (PROTONIX) 40 MG tablet, CBC Anemia, unspecified type - Plan: CBC- -Unfortunately has not yet seen GI.  Overall symptoms have improved.  We will continue PPI, Carafate for now, repeat CBC, check into GI referral.  Acute midline low back pain without sciatica - Plan: methocarbamol (ROBAXIN) 500 MG tablet   -Mechanical low back strain by history above, improving.  Refilled Robaxin if needed temporarily with RTC precautions given, handout given. Meds ordered this encounter  Medications   atorvastatin (LIPITOR) 40 MG tablet    Sig: Take 1 tablet (40 mg total) by mouth daily.    Dispense:  90 tablet    Refill:  2   empagliflozin (JARDIANCE) 10 MG TABS tablet    Sig: Take 1 tablet (10 mg total) by mouth daily before breakfast.    Dispense:  90 tablet    Refill:  1   gabapentin (NEURONTIN) 300 MG capsule    Sig: Take 1-2 capsules (300-600 mg total) by mouth 3 (three) times daily as needed.    Dispense:  540 capsule    Refill:  1   glipiZIDE (GLUCOTROL) 5 MG tablet    Sig: Take 1 tablet (5 mg total) by mouth 2 (two) times daily before a meal.    Dispense:  180 tablet    Refill:  1   metFORMIN (GLUCOPHAGE) 1000 MG tablet    Sig: Take 1 tablet (1,000 mg total) by mouth 2 (two) times daily with a meal.    Dispense:  180 tablet    Refill:  2   pantoprazole (PROTONIX) 40 MG tablet    Sig: Take 1 tablet (40 mg total) by mouth daily.    Dispense:  90 tablet    Refill:  0   methocarbamol (ROBAXIN) 500 MG tablet    Sig: Take 1 tablet (500 mg total) by mouth every 8 (  eight) hours as needed for muscle spasms.    Dispense:  20 tablet    Refill:  0   Patient Instructions  I will  check into gastroenterology referral. No med changes today.  Lab only visit (preferably fasting) at [facility] in 8:30-4:30 during weekdays, no appointment needed [place].  [place], [place] [address]  Muscle relaxer refilled temporarily.  If back pain is not continuing to improve - discuss with your employer or I am happy to see you if needed.    As we discussed at the end of your visit - olmesartan low dose is for the protein that is spilled from the kidneys. You can try taking that on a weekend to see if tolerated. If you do feel new side effects, stop it right away. Let me know if there are other questions.   Acute Back Pain, Adult Acute back pain is sudden and usually short-lived. It is often caused by an injury to the muscles and tissues in the back. The injury may result from: A muscle, tendon, or ligament getting overstretched or torn. Ligaments are tissues that connect bones to each other. Lifting something improperly can cause a back strain. Wear and tear (degeneration) of the spinal disks. Spinal disks are circular tissue that provide cushioning between the bones of the spine (vertebrae). Twisting motions, such as while playing sports or doing yard work. A hit to the back. Arthritis. You may have a physical exam, lab tests, and imaging tests to find the cause of your pain. Acute back pain usually goes away with rest and home care. Follow these instructions at home: Managing pain, stiffness, and swelling Take over-the-counter and prescription medicines only as told by your health care provider. Treatment may include medicines for pain and inflammation that are taken by mouth or applied to the skin, or muscle relaxants. Your health care provider may recommend applying ice during the first 24-48 hours after your pain starts. To do this: Put ice in a plastic bag. Place a towel between your skin and the bag. Leave the ice on for 20 minutes, 2-3 times a  day. Remove the ice if your skin turns bright red. This is very important. If you cannot feel pain, heat, or cold, you have a greater risk of damage to the area. If directed, apply heat to the affected area as often as told by your health care provider. Use the heat source that your health care provider recommends, such as a moist heat pack or a heating pad. Place a towel between your skin and the heat source. Leave the heat on for 20-30 minutes. Remove the heat if your skin turns bright red. This is especially important if you are unable to feel pain, heat, or cold. You have a greater risk of getting burned. Activity  Do not stay in bed. Staying in bed for more than 1-2 days can delay your recovery. Sit up and stand up straight. Avoid leaning forward when you sit or hunching over when you stand. If you work at a desk, sit close to it so you do not need to lean over. Keep your chin tucked in. Keep your neck drawn back, and keep your elbows bent at a 90-degree angle (right angle). Sit high and close to the steering wheel when you drive. Add lower back (lumbar) support to your car seat, if needed. Take short walks on even surfaces as soon as you are able. Try to increase the length  of time you walk each day. Do not sit, drive, or stand in one place for more than 30 minutes at a time. Sitting or standing for long periods of time can put stress on your back. Do not drive or use heavy machinery while taking prescription pain medicine. Use proper lifting techniques. When you bend and lift, use positions that put less stress on your back: [place] your knees. Keep the load close to your body. Avoid twisting. Exercise regularly as told by your health care provider. Exercising helps your back heal faster and helps prevent back injuries by keeping muscles strong and flexible. Work with a physical therapist to make a safe exercise program, as recommended by your health care provider. Do any exercises as told by  your physical therapist. Lifestyle Maintain a healthy weight. Extra weight puts stress on your back and makes it difficult to have good posture. Avoid activities or situations that make you feel anxious or stressed. Stress and anxiety increase muscle tension and can make back pain worse. Learn ways to manage anxiety and stress, such as through exercise. General instructions Sleep on a firm mattress in a comfortable position. Try lying on your side with your knees slightly bent. If you lie on your back, put a pillow under your knees. Keep your head and neck in a straight line with your spine (neutral position) when using electronic equipment like smartphones or pads. To do this: Raise your smartphone or pad to look at it instead of bending your head or neck to look down. Put the smartphone or pad at the level of your face while looking at the screen. Follow your treatment plan as told by your health care provider. This may include: Cognitive or behavioral therapy. Acupuncture or massage therapy. Meditation or yoga. Contact a health care provider if: You have pain that is not relieved with rest or medicine. You have increasing pain going down into your legs or buttocks. Your pain does not improve after 2 weeks. You have pain at night. You lose weight without trying. You have a fever or chills. You develop nausea or vomiting. You develop abdominal pain. Get help right away if: You develop new bowel or bladder control problems. You have unusual weakness or numbness in your arms or legs. You feel faint. These symptoms may represent a serious problem that is an emergency. Do not wait to see if the symptoms will go away. Get medical help right away. Call your local emergency services (911 in the [place] Do not drive yourself to the hospital. Summary Acute back pain is sudden and usually short-lived. Use proper lifting techniques. When you bend and lift, use positions that put less stress on your  back. Take over-the-counter and prescription medicines only as told by your health care provider, and apply heat or ice as told. This information is not intended to replace advice given to you by your health care provider. Make sure you discuss any questions you have with your health care provider. Document Revised: 11/25/2020 Document Reviewed: 11/25/2020 Elsevier Patient Education  [place],   [name], MD [place] 05/30/22 5:02 PM

## 2022-06-02 ENCOUNTER — Encounter: Payer: Self-pay | Admitting: Family Medicine

## 2022-06-03 ENCOUNTER — Encounter: Payer: Self-pay | Admitting: Family Medicine

## 2022-06-04 ENCOUNTER — Encounter: Payer: Self-pay | Admitting: Internal Medicine

## 2022-06-04 ENCOUNTER — Telehealth: Payer: Self-pay | Admitting: Family Medicine

## 2022-06-04 ENCOUNTER — Other Ambulatory Visit: Payer: Self-pay

## 2022-06-04 MED ORDER — SUCRALFATE 1 G PO TABS: 1.0000 g | ORAL_TABLET | Freq: Three times a day (TID) | ORAL | 3 refills | Status: DC

## 2022-06-04 NOTE — Telephone Encounter (Signed)
Encourage patient to contact the pharmacy for refills or they can request refills through [facility]  (Please schedule appointment if patient has not been seen in over a year)    [place] TO:  [facility] [address] [phone]  MEDICATION NAME & DOSE: sucralfate 1 g tablet  NOTES/COMMENTS FROM PATIENT: Pt does have an appointment with [facility], but they could not get him in until 07/11/22; pt needs 112 tablets to get him through until appointment      [place] office please notify patient: It takes 48-72 hours to process rx refill requests Ask patient to call pharmacy to ensure rx is ready before heading there.

## 2022-06-04 NOTE — Telephone Encounter (Signed)
Rx sent to pharmacy   

## 2022-06-05 ENCOUNTER — Other Ambulatory Visit (INDEPENDENT_AMBULATORY_CARE_PROVIDER_SITE_OTHER): Payer: BC Managed Care – PPO

## 2022-06-05 DIAGNOSIS — E1129 Type 2 diabetes mellitus with other diabetic kidney complication: Secondary | ICD-10-CM

## 2022-06-05 DIAGNOSIS — R809 Proteinuria, unspecified: Secondary | ICD-10-CM

## 2022-06-05 DIAGNOSIS — R1013 Epigastric pain: Secondary | ICD-10-CM

## 2022-06-05 DIAGNOSIS — D649 Anemia, unspecified: Secondary | ICD-10-CM

## 2022-06-05 DIAGNOSIS — E785 Hyperlipidemia, unspecified: Secondary | ICD-10-CM | POA: Diagnosis not present

## 2022-06-05 DIAGNOSIS — E1165 Type 2 diabetes mellitus with hyperglycemia: Secondary | ICD-10-CM | POA: Diagnosis not present

## 2022-06-05 LAB — COMPREHENSIVE METABOLIC PANEL
ALT: 26 U/L (ref 0–53)
AST: 25 U/L (ref 0–37)
Albumin: 4.4 g/dL (ref 3.5–5.2)
Alkaline Phosphatase: 43 U/L (ref 39–117)
BUN: 31 mg/dL — ABNORMAL HIGH (ref 6–23)
CO2: 27 mEq/L (ref 19–32)
Calcium: 9.7 mg/dL (ref 8.4–10.5)
Chloride: 100 mEq/L (ref 96–112)
Creatinine, Ser: 1.09 mg/dL (ref 0.40–1.50)
GFR: 75.96 mL/min (ref >60.00)
Glucose, Bld: 163 mg/dL — ABNORMAL HIGH (ref 70–99)
Potassium: 4 mEq/L (ref 3.5–5.1)
Sodium: 138 mEq/L (ref 135–145)
Total Bilirubin: 0.8 mg/dL (ref 0.2–1.2)
Total Protein: 7.2 g/dL (ref 6.0–8.3)

## 2022-06-05 LAB — LIPID PANEL
Cholesterol: 89 mg/dL (ref 0–200)
HDL: 38.3 mg/dL — ABNORMAL LOW (ref >39.00)
LDL Cholesterol: 38 mg/dL (ref 0–99)
NonHDL: 50.51
Total CHOL/HDL Ratio: 2
Triglycerides: 62 mg/dL (ref 0.0–149.0)
VLDL: 12.4 mg/dL (ref 0.0–40.0)

## 2022-06-05 LAB — CBC
HCT: 41.4 % (ref 39.0–52.0)
Hemoglobin: 13.6 g/dL (ref 13.0–17.0)
MCHC: 32.8 g/dL (ref 30.0–36.0)
MCV: 86.1 fl (ref 78.0–100.0)
Platelets: 209 10*3/uL (ref 150.0–400.0)
RBC: 4.8 Mil/uL (ref 4.22–5.81)
RDW: 14.7 % (ref 11.5–15.5)
WBC: 5.4 10*3/uL (ref 4.0–10.5)

## 2022-06-05 LAB — HEMOGLOBIN A1C: Hgb A1c MFr Bld: 7.4 % — ABNORMAL HIGH (ref 4.6–6.5)

## 2022-06-07 DIAGNOSIS — E113313 Type 2 diabetes mellitus with moderate nonproliferative diabetic retinopathy with macular edema, bilateral: Secondary | ICD-10-CM | POA: Diagnosis not present

## 2022-06-12 ENCOUNTER — Encounter: Payer: Self-pay | Admitting: Family Medicine

## 2022-07-11 ENCOUNTER — Encounter: Payer: Self-pay | Admitting: Internal Medicine

## 2022-07-11 ENCOUNTER — Ambulatory Visit (INDEPENDENT_AMBULATORY_CARE_PROVIDER_SITE_OTHER): Payer: BC Managed Care – PPO | Admitting: Internal Medicine

## 2022-07-11 VITALS — BP 90/60 | HR 104 | Ht 73.0 in | Wt 252.0 lb

## 2022-07-11 DIAGNOSIS — D649 Anemia, unspecified: Secondary | ICD-10-CM

## 2022-07-11 DIAGNOSIS — R1013 Epigastric pain: Secondary | ICD-10-CM

## 2022-07-11 NOTE — Patient Instructions (Signed)
_______________________________________________________  If you are age 56 or older, your body mass index should be between 23-30. Your Body mass index is 33.25 kg/m. If this is out of the aforementioned range listed, please consider follow up with your Primary Care Provider.  If you are age 82 or younger, your body mass index should be between 19-25. Your Body mass index is 33.25 kg/m. If this is out of the aformentioned range listed, please consider follow up with your Primary Care Provider.   ________________________________________________________  The [facility] providers would like to encourage you to use [facility] to communicate with providers for non-urgent requests or questions.  Due to long hold times on the telephone, sending your provider a message by [facility] may be a faster and more efficient way to get a response.  Please allow 48 business hours for a response.  Please remember that this is for non-urgent requests.  _______________________________________________________  David Crane have been scheduled for an endoscopy. Please follow written instructions given to you at your visit today. If you use inhalers (even only as needed), please bring them with you on the day of your procedure.

## 2022-07-11 NOTE — Progress Notes (Signed)
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  He is sent today by his primary care provider regarding anemia and concerns over possible ulcer disease.  I last saw the patient July 18, 2018 for routine screening colonoscopy.  He was found to have a diminutive hyperplastic polyp.  The exam was otherwise normal.  Follow-up in 10 years recommended.  Patient tells me that he underwent routine evaluation with his primary care provider this summer.  Blood work from June 2023 revealed hemoglobin of 12.4 with MCV 86.0.  B12 and folate levels were normal.  Iron studies were not checked.  He was placed on iron.  Most recent hemoglobin from June 05, 2022 was normal at 13.6.  MCV 86.1.  Patient denies melena or hematochezia.  He did have Hemoccult studies checked on several occasions.  These were negative.  He was empirically placed on sucralfate and pantoprazole.  He tells me that he has been feeling well except for what he describes as an epigastric turning sensation.  He would not describe this as pain.  No nausea.  He has had 20 pound weight loss this year.  He feels it may be a combination of increased activity and diet related to controlling his diabetes.  Currently taking iron once every other day.  REVIEW OF SYSTEMS:  All non-GI ROS negative unless otherwise stated in the HPI. Past Medical History:  Diagnosis Date   Diabetes mellitus without complication ([place])    GERD (gastroesophageal reflux disease)    Hyperlipidemia    Hypertension     Past Surgical History:  Procedure Laterality Date   Growth removed Left    TONSILLECTOMY     WISDOM TOOTH EXTRACTION      Social History DARREK LEASURE  reports that he has never smoked. He has never used smokeless tobacco. He reports current alcohol use. He reports that he does not use drugs.  family history includes Colon cancer in his father; Diabetes in  his father; Heart disease in his paternal grandfather; Heart failure in his brother; Other in his brother and father.  Allergies  Allergen Reactions   Ace Inhibitors Other (See Comments)    Hypotension, fatigue with attempt at low dose ACE-I and ARB for microalbuminuria.    Angiotensin Receptor Blockers Other (See Comments)    Hypotension, fatigue with attempt at low dose ACE-I and ARB for microalbuminuria       PHYSICAL EXAMINATION: Vital signs: BP 90/60 (BP Location: Left Arm, Patient Position: Sitting, Cuff Size: Normal)   Pulse (!) 104   Ht '6\' 1"'$  (1.854 m) Comment: height measured without shoes  Wt 252 lb (114.3 kg)   BMI 33.25 kg/m   Constitutional: generally well-appearing, no acute distress Psychiatric: alert and oriented x3, cooperative Eyes: extraocular movements intact, anicteric, conjunctiva pink Mouth: oral pharynx moist, no lesions Neck: supple no lymphadenopathy Cardiovascular: heart regular rate and rhythm, no murmur Lungs: clear to auscultation bilaterally Abdomen: soft, nontender, nondistended, no obvious ascites, no peritoneal signs, normal bowel sounds, no organomegaly Rectal: Omitted Extremities: no clubbing cyanosis lower extremity edema bilaterally Skin: no lesions on visible extremities Neuro: No focal deficits.  Cranial nerves intact  ASSESSMENT:  1.  Mild normocytic anemia improved with iron replacement therapy.  Stools Hemoccult negative.  Iron deficiency not documented.  Empirically placed on sucralfate and pantoprazole. 2.  Vague dyspeptic symptoms 3.  Weight loss.  Seemingly related to dietary and lifestyle  changes.  Rule out more ominous diagnoses 4.  Screening colonoscopy November 2019 negative for neoplasia 5.  Multiple general medical problems.  Stable   PLAN:  1.  Schedule upper endoscopy to evaluate anemia, dyspeptic symptoms, and weight loss.The nature of the procedure, as well as the risks, benefits, and alternatives were carefully and  thoroughly reviewed with the patient. Ample time for discussion and questions allowed. The patient understood, was satisfied, and agreed to proceed. 2.  Continue PPI and sucralfate for now.  May discontinue after endoscopic results known. 3.  Routine surveillance colonoscopy around November 2029 4.  Ongoing general medical care with Dr. Carlota Raspberry A total time of 45 minutes was spent preparing to see the patient, reviewing outside data and office notes, obtaining comprehensive history, performing medically appropriate physical examination, counseling and educating the patient regarding the above listed issues, answering questions, ordering endoscopic procedure, and documenting clinical information in the health record.

## 2022-07-26 DIAGNOSIS — E113313 Type 2 diabetes mellitus with moderate nonproliferative diabetic retinopathy with macular edema, bilateral: Secondary | ICD-10-CM | POA: Diagnosis not present

## 2022-08-02 ENCOUNTER — Ambulatory Visit (AMBULATORY_SURGERY_CENTER): Payer: BC Managed Care – PPO | Admitting: Internal Medicine

## 2022-08-02 ENCOUNTER — Encounter: Payer: Self-pay | Admitting: Internal Medicine

## 2022-08-02 VITALS — BP 109/75 | HR 72 | Temp 98.0°F | Resp 11 | Ht 73.0 in | Wt 252.0 lb

## 2022-08-02 DIAGNOSIS — D649 Anemia, unspecified: Secondary | ICD-10-CM

## 2022-08-02 DIAGNOSIS — R1013 Epigastric pain: Secondary | ICD-10-CM

## 2022-08-02 DIAGNOSIS — R634 Abnormal weight loss: Secondary | ICD-10-CM

## 2022-08-02 MED ORDER — SODIUM CHLORIDE 0.9 % IV SOLN: 500.0000 mL | Freq: Once | INTRAVENOUS | Status: DC

## 2022-08-02 NOTE — Progress Notes (Signed)
Sedate, gd SR, tolerated procedure well, VSS, report to RN 

## 2022-08-02 NOTE — Op Note (Signed)
[name] Patient Name: David Crane Procedure Date: 08/02/2022 3:50 PM MRN: [id number] Endoscopist: Docia Chuck. Henrene Pastor , MD, [id number] Age: 56 Referring MD:  Date of Birth: [birth year] Gender: Male Account #: [id number] Procedure:                Upper GI endoscopy Indications:              Normocytic anemia, Dyspepsia Medicines:                Monitored Anesthesia Care Procedure:                Pre-Anesthesia Assessment:                           - Prior to the procedure, a History and Physical                            was performed, and patient medications and                            allergies were reviewed. The patient's tolerance of                            previous anesthesia was also reviewed. The risks                            and benefits of the procedure and the sedation                            options and risks were discussed with the patient.                            All questions were answered, and informed consent                            was obtained. Prior Anticoagulants: The patient has                            taken no anticoagulant or antiplatelet agents. ASA                            Grade Assessment: II - A patient with mild systemic                            disease. After reviewing the risks and benefits,                            the patient was deemed in satisfactory condition to                            undergo the procedure.                           After obtaining informed consent, the endoscope was  passed under direct vision. Throughout the                            procedure, the patient's blood pressure, pulse, and                            oxygen saturations were monitored continuously. The                            Endoscope was introduced through the mouth, and                            advanced to the second part of duodenum. The upper                            GI endoscopy was  accomplished without difficulty.                            The patient tolerated the procedure well. Scope In: Scope Out: Findings:                 The esophagus was normal.                           The stomach was normal.                           The examined duodenum was normal.                           The cardia and gastric fundus were normal on                            retroflexion. Complications:            No immediate complications. Estimated Blood Loss:     Estimated blood loss: none. Impression:               - Normal esophagus.                           - Normal stomach.                           - Normal examined duodenum.                           - No specimens collected. Recommendation:           - Patient has a contact number available for                            emergencies. The signs and symptoms of potential                            delayed complications were discussed with the  patient. Return to normal activities tomorrow.                            Written discharge instructions were provided to the                            patient.                           - Resume previous diet.                           - Continue present medications.                           - Okay to stop pantoprazole and sucralfate Docia Chuck. Henrene Pastor, MD 08/02/2022 4:10:52 PM This report has been signed electronically.

## 2022-08-02 NOTE — Patient Instructions (Signed)
Resume previous diet.  Continue present medications.  Okay to stop pantoprazole and sucralfate.  YOU HAD AN ENDOSCOPIC PROCEDURE TODAY AT [facility]:   Refer to the procedure report that was given to you for any specific questions about what was found during the examination.  If the procedure report does not answer your questions, please call your gastroenterologist to clarify.  If you requested that your care partner not be given the details of your procedure findings, then the procedure report has been included in a sealed envelope for you to review at your convenience later.  YOU SHOULD EXPECT: Some feelings of bloating in the abdomen. Passage of more gas than usual.  Walking can help get rid of the air that was put into your GI tract during the procedure and reduce the bloating. If you had a lower endoscopy (such as a colonoscopy or flexible sigmoidoscopy) you may notice spotting of blood in your stool or on the toilet paper. If you underwent a bowel prep for your procedure, you may not have a normal bowel movement for a few days.  Please Note:  You might notice some irritation and congestion in your nose or some drainage.  This is from the oxygen used during your procedure.  There is no need for concern and it should clear up in a day or so.  SYMPTOMS TO REPORT IMMEDIATELY:  Following upper endoscopy (EGD)  Vomiting of blood or coffee ground material  New chest pain or pain under the shoulder blades  Painful or persistently difficult swallowing  New shortness of breath  Fever of 100F or higher  Black, tarry-looking stools  For urgent or emergent issues, a gastroenterologist can be reached at any hour by calling [phone]. Do not use MyChart messaging for urgent concerns.    DIET:  We do recommend a small meal at first, but then you may proceed to your regular diet.  Drink plenty of fluids but you should avoid alcoholic beverages for 24 hours.  ACTIVITY:  You  should plan to take it easy for the rest of today and you should NOT DRIVE or use heavy machinery until tomorrow (because of the sedation medicines used during the test).    FOLLOW UP: Our staff will call the number listed on your records the next business day following your procedure.  We will call around 7:15- 8:00 am to check on you and address any questions or concerns that you may have regarding the information given to you following your procedure. If we do not reach you, we will leave a message.     If any biopsies were taken you will be contacted by phone or by letter within the next 1-3 weeks.  Please call us at [phone] if you have not heard about the biopsies in 3 weeks.    SIGNATURES/CONFIDENTIALITY: You and/or your care partner have signed paperwork which will be entered into your electronic medical record.  These signatures attest to the fact that that the information above on your After Visit Summary has been reviewed and is understood.  Full responsibility of the confidentiality of this discharge information lies with you and/or your care-partner.

## 2022-08-03 ENCOUNTER — Telehealth: Payer: Self-pay

## 2022-08-03 NOTE — Telephone Encounter (Signed)
  Follow up Call-     08/02/2022    2:53 PM  Call back number  Post procedure Call Back phone  # [phone]  Permission to leave phone message Yes     Patient questions:  Do you have a fever, pain , or abdominal swelling? No. Pain Score  0 *  Have you tolerated food without any problems? Yes.    Have you been able to return to your normal activities? Yes.    Do you have any questions about your discharge instructions: Diet   No. Medications  No. Follow up visit  No.  Do you have questions or concerns about your Care? No.  Actions: * If pain score is 4 or above: No action needed, pain <4.

## 2022-08-28 DIAGNOSIS — E119 Type 2 diabetes mellitus without complications: Secondary | ICD-10-CM | POA: Diagnosis not present

## 2022-08-30 DIAGNOSIS — E113312 Type 2 diabetes mellitus with moderate nonproliferative diabetic retinopathy with macular edema, left eye: Secondary | ICD-10-CM | POA: Diagnosis not present

## 2022-08-30 DIAGNOSIS — E113313 Type 2 diabetes mellitus with moderate nonproliferative diabetic retinopathy with macular edema, bilateral: Secondary | ICD-10-CM | POA: Diagnosis not present

## 2022-08-30 DIAGNOSIS — H35033 Hypertensive retinopathy, bilateral: Secondary | ICD-10-CM | POA: Diagnosis not present

## 2022-08-30 DIAGNOSIS — H43821 Vitreomacular adhesion, right eye: Secondary | ICD-10-CM | POA: Diagnosis not present

## 2022-08-30 DIAGNOSIS — E113311 Type 2 diabetes mellitus with moderate nonproliferative diabetic retinopathy with macular edema, right eye: Secondary | ICD-10-CM | POA: Diagnosis not present

## 2022-09-05 ENCOUNTER — Ambulatory Visit (INDEPENDENT_AMBULATORY_CARE_PROVIDER_SITE_OTHER): Payer: BC Managed Care – PPO | Admitting: Family Medicine

## 2022-09-05 ENCOUNTER — Encounter: Payer: Self-pay | Admitting: Family Medicine

## 2022-09-05 VITALS — BP 116/70 | HR 89 | Temp 98.5°F | Ht 73.0 in | Wt 247.6 lb

## 2022-09-05 DIAGNOSIS — R809 Proteinuria, unspecified: Secondary | ICD-10-CM | POA: Diagnosis not present

## 2022-09-05 DIAGNOSIS — R739 Hyperglycemia, unspecified: Secondary | ICD-10-CM | POA: Diagnosis not present

## 2022-09-05 DIAGNOSIS — E1129 Type 2 diabetes mellitus with other diabetic kidney complication: Secondary | ICD-10-CM | POA: Diagnosis not present

## 2022-09-05 DIAGNOSIS — E1165 Type 2 diabetes mellitus with hyperglycemia: Secondary | ICD-10-CM

## 2022-09-05 DIAGNOSIS — G629 Polyneuropathy, unspecified: Secondary | ICD-10-CM

## 2022-09-05 MED ORDER — GLIPIZIDE 5 MG PO TABS: 2.5000 mg | ORAL_TABLET | Freq: Two times a day (BID) | ORAL | 1 refills | Status: DC

## 2022-09-05 MED ORDER — GABAPENTIN 300 MG PO CAPS: 300.0000 mg | ORAL_CAPSULE | Freq: Three times a day (TID) | ORAL | 1 refills | Status: DC | PRN

## 2022-09-05 MED ORDER — EMPAGLIFLOZIN 10 MG PO TABS: 10.0000 mg | ORAL_TABLET | Freq: Every day | ORAL | 1 refills | Status: DC

## 2022-09-05 NOTE — Patient Instructions (Addendum)
Depending on A1c today, may need to stop glipizide - especially if any further low blood sugars. No changes for now. Glad to see that the endoscopy looked ok.

## 2022-09-05 NOTE — Progress Notes (Signed)
Subjective:  Patient ID: David Crane, male    DOB: [birth year]  Age: 56 y.o. MRN: [id number]  CC:  Chief Complaint  Patient presents with   Diabetes    Pt states all is well    HPI ETHIN DRUMMOND presents for   Diabetes: Complicated by peripheral neuropathy, microalbuminuria, retinopathy.  Metformin 1000 mg twice daily.  Jardiance 10 mg daily.  Glipizide has been discontinued previously even with increased readings restarted at low-dose at 2.5 mg twice daily.  He is on statin as well as gabapentin for neuropathy symptoms.  Gabapentin 1 in the morning, 2 at night - still working well.  occasional afternoon dose.  We have tried ACE inhibitor for his microalbumin past but has not tolerated even low-dose. He is on statin.  Home readings fasting:78,108 - coming down. Postprandial:151.  Symptomatic low - 55 a few days ago. Felt it coming on. Ate something and better. No other episodes in past few months..  Microalbumin: 2.1 on 12/13/2021 Less late meals, less snacking, more active with work. Some weight loss.  No abdominal pain.  Some urinary frequency, no dysuria. Same for awhile, on SGLT2.  Nocturia 0-2 per night. No changes.   Wt Readings from Last 3 Encounters:  09/05/22 247 lb 9.6 oz (112.3 kg)  08/02/22 252 lb (114.3 kg)  07/11/22 252 lb (114.3 kg)   Optho, foot exam, pneumovax:  Regular optho care and injections for retinopathy.  Flu vaccine - declined.   Lab Results  Component Value Date   HGBA1C 7.4 (H) 06/05/2022   HGBA1C 8.3 (H) 02/21/2022   HGBA1C 7.9 (H) 12/08/2021   Lab Results  Component Value Date   MICROALBUR 2.1 (H) 12/13/2021   LDLCALC 38 06/05/2022   CREATININE 1.09 06/05/2022   Epigastric pain, normocytic anemia Was seen in October by Dr. Henrene Pastor, endoscopy November 16, Normal esophagus stomach and duodenum. Stopped protonix and carafate, doing well. No further meds needed. No abdominal pain off meds - option to restart temporarily if needed if  heartburn.   History Patient Active Problem List   Diagnosis Date Noted   Diabetic nephropathy ([place]) 05/01/2017   Diabetes ([place]) 05/01/2017   Peripheral neuropathy 05/01/2017   Past Medical History:  Diagnosis Date   Diabetes mellitus without complication ([place])    GERD (gastroesophageal reflux disease)    Hyperlipidemia    Hypertension    Past Surgical History:  Procedure Laterality Date   Growth removed Left    TONSILLECTOMY     WISDOM TOOTH EXTRACTION     Allergies  Allergen Reactions   Ace Inhibitors Other (See Comments)    Hypotension, fatigue with attempt at low dose ACE-I and ARB for microalbuminuria.    Angiotensin Receptor Blockers Other (See Comments)    Hypotension, fatigue with attempt at low dose ACE-I and ARB for microalbuminuria   Prior to Admission medications   Medication Sig Start Date End Date Taking? Authorizing Provider  aspirin EC 81 MG tablet Take 1 tablet (81 mg total) by mouth daily. Swallow whole. 06/15/20  Yes O'Neal, Cassie Freer, MD  atorvastatin (LIPITOR) 40 MG tablet Take 1 tablet (40 mg total) by mouth daily. 05/30/22  Yes Wendie Agreste, MD  blood glucose meter kit and supplies Dispense based on patient and insurance preference. Check twice per day. FOR ICD-9 250.00, 250.01). 04/29/17  Yes Wendie Agreste, MD  gabapentin (NEURONTIN) 300 MG capsule Take 1-2 capsules (300-600 mg total) by mouth 3 (three) times daily as needed.  05/30/22  Yes Wendie Agreste, MD  glipiZIDE (GLUCOTROL) 5 MG tablet Take 1 tablet (5 mg total) by mouth 2 (two) times daily before a meal. 05/30/22  Yes Wendie Agreste, MD  glucose blood test strip Use as instructed 03/26/22  Yes Wendie Agreste, MD  Lancets 30G MISC Check sugar twice a day before meals 03/26/22  Yes Wendie Agreste, MD  metFORMIN (GLUCOPHAGE) 1000 MG tablet Take 1 tablet (1,000 mg total) by mouth 2 (two) times daily with a meal. 05/30/22  Yes Wendie Agreste, MD  empagliflozin (JARDIANCE) 10 MG  TABS tablet Take 1 tablet (10 mg total) by mouth daily before breakfast. Patient taking differently: Take 10 mg by mouth daily before breakfast. Pt is taking 05/30/22   Wendie Agreste, MD  pantoprazole (PROTONIX) 40 MG tablet Take 1 tablet (40 mg total) by mouth daily. Patient not taking: Reported on 09/05/2022 05/30/22   Wendie Agreste, MD   Social History   Socioeconomic History   Marital status: Married    Spouse name: Not on file   Number of children: 2   Years of education: Not on file   Highest education level: Not on file  Occupational History   Occupation: RT driver  Tobacco Use   Smoking status: Never   Smokeless tobacco: Never  Vaping Use   Vaping Use: Never used  Substance and Sexual Activity   Alcohol use: Yes    Comment: social   Drug use: No   Sexual activity: Yes  Other Topics Concern   Not on file  Social History Narrative   Not on file   Social Determinants of Health   Financial Resource Strain: Not on file  Food Insecurity: Not on file  Transportation Needs: Not on file  Physical Activity: Not on file  Stress: Not on file  Social Connections: Not on file  Intimate Partner Violence: Not on file    Review of Systems  Constitutional:  Negative for fatigue and unexpected weight change.  Eyes:  Negative for visual disturbance.  Respiratory:  Negative for cough, chest tightness and shortness of breath.   Cardiovascular:  Negative for chest pain, palpitations and leg swelling.  Gastrointestinal:  Negative for abdominal pain and blood in stool.  Neurological:  Negative for dizziness, light-headedness and headaches.     Objective:   Vitals:   09/05/22 1629  BP: 116/70  Pulse: 89  Temp: 98.5 F (36.9 C)  SpO2: 96%  Weight: 247 lb 9.6 oz (112.3 kg)  Height: _0  (1.854 m)     Physical Exam Vitals reviewed.  Constitutional:      Appearance: He is well-developed.  HENT:     Head: Normocephalic and atraumatic.  Neck:     Vascular: No  carotid bruit or JVD.  Cardiovascular:     Rate and Rhythm: Normal rate and regular rhythm.     Heart sounds: Normal heart sounds. No murmur heard. Pulmonary:     Effort: Pulmonary effort is normal.     Breath sounds: Normal breath sounds. No rales.  Musculoskeletal:     Right lower leg: No edema.     Left lower leg: No edema.  Skin:    General: Skin is warm and dry.  Neurological:     Mental Status: He is alert and oriented to person, place, and time.  Psychiatric:        Mood and Affect: Mood normal.     Assessment & Plan:  Chevis Pretty  Kissel is a 56 y.o. male . Type 2 diabetes mellitus with hyperglycemia, without long-term current use of insulin (HCC) - Plan: Basic metabolic panel, Hemoglobin A1c, Hemoglobin D5W, Basic metabolic panel  Peripheral polyneuropathy - Plan: gabapentin (NEURONTIN) 300 MG capsule  Type 2 diabetes mellitus with microalbuminuria, without long-term current use of insulin (HCC) - Plan: glipiZIDE (GLUCOTROL) 5 MG tablet, empagliflozin (JARDIANCE) 10 MG TABS tablet  Hyperglycemia - Plan: empagliflozin (JARDIANCE) 10 MG TABS tablet Check labs.  Overall tolerating current regimen.  Will continue same dose of Jardiance, metformin, gabapentin for now.  Will continue glipizide for now as long as he does not experience further hypoglycemia with RTC precautions.  Stop glipizide if return of hypoglycemia.  37-monthrecheck. Unfortunately has not tolerated ARB, ACE inhibitor's.  Will continue Jardiance as above, but hold on new meds for microalbuminuria.  Meds ordered this encounter  Medications   gabapentin (NEURONTIN) 300 MG capsule    Sig: Take 1-2 capsules (300-600 mg total) by mouth 3 (three) times daily as needed.    Dispense:  540 capsule    Refill:  1   glipiZIDE (GLUCOTROL) 5 MG tablet    Sig: Take 0.5 tablets (2.5 mg total) by mouth 2 (two) times daily before a meal.    Dispense:  180 tablet    Refill:  1   empagliflozin (JARDIANCE) 10 MG TABS tablet     Sig: Take 1 tablet (10 mg total) by mouth daily before breakfast.    Dispense:  90 tablet    Refill:  1   Patient Instructions  Depending on A1c today, may need to stop glipizide - especially if any further low blood sugars.      Signed,   JMerri Ray MD [facility] 09/05/22 5:32 PM

## 2022-09-06 LAB — BASIC METABOLIC PANEL
BUN: 34 mg/dL — ABNORMAL HIGH (ref 6–23)
CO2: 29 mEq/L (ref 19–32)
Calcium: 9.8 mg/dL (ref 8.4–10.5)
Chloride: 100 mEq/L (ref 96–112)
Creatinine, Ser: 1.19 mg/dL (ref 0.40–1.50)
GFR: 68.25 mL/min (ref >60.00)
Glucose, Bld: 120 mg/dL — ABNORMAL HIGH (ref 70–99)
Potassium: 4.8 mEq/L (ref 3.5–5.1)
Sodium: 138 mEq/L (ref 135–145)

## 2022-09-06 LAB — HEMOGLOBIN A1C: Hgb A1c MFr Bld: 7 % — ABNORMAL HIGH (ref 4.6–6.5)

## 2022-10-24 ENCOUNTER — Ambulatory Visit: Payer: Commercial Managed Care - HMO | Admitting: Family Medicine

## 2022-10-24 ENCOUNTER — Encounter: Payer: Self-pay | Admitting: Family Medicine

## 2022-10-24 VITALS — BP 112/60 | HR 91 | Temp 98.4°F | Ht 73.0 in | Wt 247.3 lb

## 2022-10-24 DIAGNOSIS — L97519 Non-pressure chronic ulcer of other part of right foot with unspecified severity: Secondary | ICD-10-CM | POA: Diagnosis not present

## 2022-10-24 DIAGNOSIS — G629 Polyneuropathy, unspecified: Secondary | ICD-10-CM

## 2022-10-24 DIAGNOSIS — T753XXA Motion sickness, initial encounter: Secondary | ICD-10-CM

## 2022-10-24 DIAGNOSIS — S90415A Abrasion, left lesser toe(s), initial encounter: Secondary | ICD-10-CM

## 2022-10-24 MED ORDER — DOXYCYCLINE HYCLATE 100 MG PO TABS: 100.0000 mg | ORAL_TABLET | Freq: Two times a day (BID) | ORAL | 0 refills | Status: DC

## 2022-10-24 MED ORDER — SCOPOLAMINE 1 MG/3DAYS TD PT72: 1.0000 | MEDICATED_PATCH | TRANSDERMAL | 0 refills | Status: DC

## 2022-10-24 NOTE — Progress Notes (Signed)
Subjective:  Patient ID: David Crane, male    DOB: [birth year]  Age: 57 y.o. MRN: [id number]  CC:  Chief Complaint  Patient presents with   Blister    Pt notes new shoes caused blister last Monday white spot in the middle of bright red circle, pt is diabetic wants to ensure this will heal okay, middle toe rt foot    Travel Consult    Pt is traveling and taking a cruise and is asking for a scopolamine patch or other motion sickness remedy     HPI David Crane presents for   Toe blister Noted 9 days ago - last Monday, white spot in the middle of a bright red circle on his middle toe of the right foot. Placed triple abx and otc manuka honey. Noted after new shoes - has since taken those back.  Center part looks better, just not healing, redness may be moving outward. Similar area on left foot that healed.  Hamartoes in those areas.  History of diabetes with diabetic neuropathy, No fever.   Motion sickness:  Cruise end of March. 1 week cruise. May take another cruise in the future.  Had motion sickness with deep sea fishing in past.  Would like to try med transderm scop.  No hx of urinary retention.    History Patient Active Problem List   Diagnosis Date Noted   Diabetic nephropathy ([place]) 05/01/2017   Diabetes ([place]) 05/01/2017   Peripheral neuropathy 05/01/2017   Past Medical History:  Diagnosis Date   Diabetes mellitus without complication ([place])    GERD (gastroesophageal reflux disease)    Hyperlipidemia    Hypertension    Past Surgical History:  Procedure Laterality Date   Growth removed Left    TONSILLECTOMY     WISDOM TOOTH EXTRACTION     Allergies  Allergen Reactions   Ace Inhibitors Other (See Comments)    Hypotension, fatigue with attempt at low dose ACE-I and ARB for microalbuminuria.    Angiotensin Receptor Blockers Other (See Comments)    Hypotension, fatigue with attempt at low dose ACE-I and ARB for microalbuminuria   Prior to Admission  medications   Medication Sig Start Date End Date Taking? Authorizing Provider  aspirin EC 81 MG tablet Take 1 tablet (81 mg total) by mouth daily. Swallow whole. 06/15/20  Yes O'Neal, Cassie Freer, MD  atorvastatin (LIPITOR) 40 MG tablet Take 1 tablet (40 mg total) by mouth daily. 05/30/22  Yes Wendie Agreste, MD  blood glucose meter kit and supplies Dispense based on patient and insurance preference. Check twice per day. FOR ICD-9 250.00, 250.01). 04/29/17  Yes Wendie Agreste, MD  empagliflozin (JARDIANCE) 10 MG TABS tablet Take 1 tablet (10 mg total) by mouth daily before breakfast. 09/05/22  Yes Wendie Agreste, MD  gabapentin (NEURONTIN) 300 MG capsule Take 1-2 capsules (300-600 mg total) by mouth 3 (three) times daily as needed. 09/05/22  Yes Wendie Agreste, MD  glipiZIDE (GLUCOTROL) 5 MG tablet Take 0.5 tablets (2.5 mg total) by mouth 2 (two) times daily before a meal. 09/05/22  Yes Wendie Agreste, MD  glucose blood test strip Use as instructed 03/26/22  Yes Wendie Agreste, MD  Lancets 30G MISC Check sugar twice a day before meals 03/26/22  Yes Wendie Agreste, MD  metFORMIN (GLUCOPHAGE) 1000 MG tablet Take 1 tablet (1,000 mg total) by mouth 2 (two) times daily with a meal. 05/30/22  Yes Wendie Agreste, MD  Social History   Socioeconomic History   Marital status: Married    Spouse name: Not on file   Number of children: 2   Years of education: Not on file   Highest education level: Not on file  Occupational History   Occupation: RT driver  Tobacco Use   Smoking status: Never   Smokeless tobacco: Never  Vaping Use   Vaping Use: Never used  Substance and Sexual Activity   Alcohol use: Yes    Comment: social   Drug use: No   Sexual activity: Yes  Other Topics Concern   Not on file  Social History Narrative   Not on file   Social Determinants of Health   Financial Resource Strain: Not on file  Food Insecurity: Not on file  Transportation Needs: Not on file   Physical Activity: Not on file  Stress: Not on file  Social Connections: Not on file  Intimate Partner Violence: Not on file    Review of Systems   Objective:   Vitals:   10/24/22 1535  BP: 112/60  Pulse: 91  Temp: 98.4 F (36.9 C)  TempSrc: Temporal  SpO2: 96%  Weight: 247 lb 4.8 oz (112.2 kg)  Height: '6\' 1"'$  (1.854 m)     Physical Exam Constitutional:      General: He is not in acute distress.    Appearance: Normal appearance. He is well-developed.  HENT:     Head: Normocephalic and atraumatic.  Cardiovascular:     Rate and Rhythm: Normal rate.  Pulmonary:     Effort: Pulmonary effort is normal.  Skin:    Comments: See photos, primary ulceration on the right third toe, healing left third toe.  No appreciable surrounding bony tenderness.  Neurological:     Mental Status: He is alert and oriented to person, place, and time.  Psychiatric:        Mood and Affect: Mood normal.          Assessment & Plan:  David Crane is a 57 y.o. male . Ulcer of toe, right, with unspecified severity ([place]) - Plan: doxycycline (VIBRA-TABS) 100 MG tablet Abrasion of toe of left foot, initial encounter - Plan: doxycycline (VIBRA-TABS) 100 MG tablet Peripheral polyneuropathy  -Ulceration, abrasion of third toe bilaterally with hammertoes.  New shoes likely cause abrasion and contributed from his history of neuropathy with diabetes.  Left appears to be improving, right still with ulceration/abrasion centrally, some surrounding erythema with some possible recent worsening.  No exudate, no fluctuance, no appreciable abscess.  No deep tissue involvement apparent on exam.  Unlikely osteomyelitis based on exam, RTC precautions given.  -Corn/callus pad to avoid direct pressure to abraded area/ulceration.  Should be less of an issue with his current shoes.  Continue wound care over area with cleansing with soap and water, keeping clean and covered, Polysporin/topical antibiotic temporarily  is okay for now.  Recheck 1 week if not significantly improved, sooner if worse   Motion sickness, initial encounter - Plan: scopolamine (TRANSDERM SCOP, 1.5 MG,) 1 MG/3DAYS  -History of motion sickness, but that was on the smaller boat.  He may be okay on the cruise line if mild seas.  Option of Transderm scopolamine with potential side effects and risk discussed, start 4 hours prior to need.  Replace after 3 days.  If this is tolerated, I did provide quantity to be available for his next cruise as well.  Preventative techniques were discussed for motion sickness.  Meds ordered this  encounter  Medications   scopolamine (TRANSDERM SCOP, 1.5 MG,) 1 MG/3DAYS    Sig: Place 1 patch (1.5 mg total) onto the skin every 3 (three) days.    Dispense:  6 patch    Refill:  0   doxycycline (VIBRA-TABS) 100 MG tablet    Sig: Take 1 tablet (100 mg total) by mouth 2 (two) times daily.    Dispense:  20 tablet    Refill:  0   Patient Instructions  If needed, can use sea sickness patch. Should be applied 4 hours prior to needed use and replaced after 3 days.  Try Dr. Felicie Morn corn or callus pad to take pressure off of the central ulcer on the toe. Keep area clean and covered with clean bandage daily.  Start doxycycline antibiotic.  Recheck in 1 week if that is not continuing to improve, sooner if any worsening.  Can also use the similar treatment to the left foot but that appears to be healing.  Let me know if there are questions.  If those toes continue to be an issue, it may be best to meet with podiatry as it appears there are hammertoes which may continually rub on the shoes.  There are some braces that can be applied for those at times.  Let me know.       Signed,   [name], MD [facility] 10/24/22 5:49 PM

## 2022-10-24 NOTE — Patient Instructions (Addendum)
If needed, can use sea sickness patch. Should be applied 4 hours prior to needed use and replaced after 3 days.  Try Dr. Felicie Morn corn or callus pad to take pressure off of the central ulcer on the toe. Keep area clean and covered with clean bandage daily.  Start doxycycline antibiotic.  Recheck in 1 week if that is not continuing to improve, sooner if any worsening.  Can also use the similar treatment to the left foot but that appears to be healing.  Let me know if there are questions.  If those toes continue to be an issue, it may be best to meet with podiatry as it appears there are hammertoes which may continually rub on the shoes.  There are some braces that can be applied for those at times.  Let me know.

## 2022-11-22 ENCOUNTER — Encounter: Payer: Self-pay | Admitting: Family Medicine

## 2022-12-17 ENCOUNTER — Ambulatory Visit (INDEPENDENT_AMBULATORY_CARE_PROVIDER_SITE_OTHER): Payer: BC Managed Care – PPO | Admitting: Family Medicine

## 2022-12-17 ENCOUNTER — Encounter: Payer: Self-pay | Admitting: Family Medicine

## 2022-12-17 VITALS — BP 110/70 | HR 92 | Temp 98.2°F | Resp 17 | Ht 73.0 in | Wt 243.0 lb

## 2022-12-17 DIAGNOSIS — E785 Hyperlipidemia, unspecified: Secondary | ICD-10-CM

## 2022-12-17 DIAGNOSIS — E1165 Type 2 diabetes mellitus with hyperglycemia: Secondary | ICD-10-CM | POA: Diagnosis not present

## 2022-12-17 NOTE — Progress Notes (Signed)
Subjective:  Patient ID: David Crane, male    DOB: Oct 10, 1965  Age: 57 y.o. MRN: [id number]  CC:  Chief Complaint  Patient presents with   Diabetes    3 month follow up on diabetes Doing well per pt  Blood sugars have been below 100     HPI David Crane presents for   Diabetes: Complicated by peripheral neuropathy, microalbuminuria, retinopathy.  Treated with metformin 1000 mg twice daily, Jardiance 10 mg daily.  Low-dose glipizide (2.5mg  BID)for prior elevated readings with hypoglycemia precautions.  He is on statin as well as gabapentin for neuropathy symptoms, 1 in the morning 2 at night for gabapentin.  Occasionally does require afternoon dose -once or twice per week. He has been intolerant to ACE inhibitors or ARB, even at low-dose. Home readings fasting: 90's, no lows.  Postprandial: 116-135 Symptomatic lows: none. Lowest 70 No new side effects.   Microalbumin: 2.1 on 12/13/2021, repeated today. Optho, foot exam, pneumovax: followed by optho - Dr. Iona Hansen for retina every 6 weeks.   Prior toe ulcer healed. Cruise went well.   Lab Results  Component Value Date   HGBA1C 7.0 (H) 09/05/2022   HGBA1C 7.4 (H) 06/05/2022   HGBA1C 8.3 (H) 02/21/2022   Lab Results  Component Value Date   MICROALBUR 2.1 (H) 12/13/2021   LDLCALC 38 06/05/2022   CREATININE 1.19 09/05/2022   Wt Readings from Last 3 Encounters:  12/17/22 243 lb (110.2 kg)  10/24/22 247 lb 4.8 oz (112.2 kg)  09/05/22 247 lb 9.6 oz (112.3 kg)     History Patient Active Problem List   Diagnosis Date Noted   Diabetic nephropathy 05/01/2017   Diabetes 05/01/2017   Peripheral neuropathy 05/01/2017   Past Medical History:  Diagnosis Date   Diabetes mellitus without complication    GERD (gastroesophageal reflux disease)    Hyperlipidemia    Hypertension    Past Surgical History:  Procedure Laterality Date   Growth removed Left    TONSILLECTOMY     WISDOM TOOTH EXTRACTION     Allergies   Allergen Reactions   Ace Inhibitors Other (See Comments)    Hypotension, fatigue with attempt at low dose ACE-I and ARB for microalbuminuria.    Angiotensin Receptor Blockers Other (See Comments)    Hypotension, fatigue with attempt at low dose ACE-I and ARB for microalbuminuria   Prior to Admission medications   Medication Sig Start Date End Date Taking? Authorizing Provider  aspirin EC 81 MG tablet Take 1 tablet (81 mg total) by mouth daily. Swallow whole. 06/15/20  Yes O'Neal, Cassie Freer, MD  atorvastatin (LIPITOR) 40 MG tablet Take 1 tablet (40 mg total) by mouth daily. 05/30/22  Yes Wendie Agreste, MD  blood glucose meter kit and supplies Dispense based on patient and insurance preference. Check twice per day. FOR ICD-9 250.00, 250.01). 04/29/17  Yes Wendie Agreste, MD  empagliflozin (JARDIANCE) 10 MG TABS tablet Take 1 tablet (10 mg total) by mouth daily before breakfast. 09/05/22  Yes Wendie Agreste, MD  gabapentin (NEURONTIN) 300 MG capsule Take 1-2 capsules (300-600 mg total) by mouth 3 (three) times daily as needed. 09/05/22  Yes Wendie Agreste, MD  glipiZIDE (GLUCOTROL) 5 MG tablet Take 0.5 tablets (2.5 mg total) by mouth 2 (two) times daily before a meal. 09/05/22  Yes Wendie Agreste, MD  glucose blood test strip Use as instructed 03/26/22  Yes Wendie Agreste, MD  Lancets 30G MISC Check sugar twice  a day before meals 03/26/22  Yes Wendie Agreste, MD  metFORMIN (GLUCOPHAGE) 1000 MG tablet Take 1 tablet (1,000 mg total) by mouth 2 (two) times daily with a meal. 05/30/22  Yes Wendie Agreste, MD  doxycycline (VIBRA-TABS) 100 MG tablet Take 1 tablet (100 mg total) by mouth 2 (two) times daily. Patient not taking: Reported on 12/17/2022 10/24/22   Wendie Agreste, MD  scopolamine (TRANSDERM SCOP, 1.5 MG,) 1 MG/3DAYS Place 1 patch (1.5 mg total) onto the skin every 3 (three) days. Patient not taking: Reported on 12/17/2022 10/24/22   Wendie Agreste, MD   Social History    Socioeconomic History   Marital status: Married    Spouse name: Not on file   Number of children: 2   Years of education: Not on file   Highest education level: Associate degree: occupational, Hotel manager, or vocational program  Occupational History   Occupation: RT driver  Tobacco Use   Smoking status: Never   Smokeless tobacco: Never  Vaping Use   Vaping Use: Never used  Substance and Sexual Activity   Alcohol use: Yes    Comment: social   Drug use: No   Sexual activity: Yes  Other Topics Concern   Not on file  Social History Narrative   Not on file   Social Determinants of Health   Financial Resource Strain: Low Risk  (12/16/2022)   Overall Financial Resource Strain (CARDIA)    Difficulty of Paying Living Expenses: Not hard at all  Food Insecurity: No Food Insecurity (12/16/2022)   Hunger Vital Sign    Worried About Running Out of Food in the Last Year: Never true    Ran Out of Food in the Last Year: Never true  Transportation Needs: No Transportation Needs (12/16/2022)   PRAPARE - Hydrologist (Medical): No    Lack of Transportation (Non-Medical): No  Physical Activity: Unknown (12/16/2022)   Exercise Vital Sign    Days of Exercise per Week: Patient declined    Minutes of Exercise per Session: Not on file  Stress: No Stress Concern Present (12/16/2022)   [place]    Feeling of Stress : Not at all  Social Connections: Moderately Integrated (12/16/2022)   Social Connection and Isolation Panel [NHANES]    Frequency of Communication with Friends and Family: More than three times a week    Frequency of Social Gatherings with Friends and Family: Twice a week    Attends Religious Services: 1 to 4 times per year    Active Member of [employer] or Organizations: No    Attends Music therapist: Not on file    Marital Status: Married  Human resources officer Violence: Not on file     Review of Systems  Constitutional:  Negative for fatigue and unexpected weight change.  Eyes:  Negative for visual disturbance.  Respiratory:  Negative for cough, chest tightness and shortness of breath.   Cardiovascular:  Negative for chest pain, palpitations and leg swelling.  Gastrointestinal:  Negative for abdominal pain and blood in stool.  Neurological:  Negative for dizziness, light-headedness and headaches.     Objective:   Vitals:   12/17/22 1604  BP: 110/70  Pulse: 92  Resp: 17  Temp: 98.2 F (36.8 C)  TempSrc: Temporal  SpO2: 99%  Weight: 243 lb (110.2 kg)  Height: 6\' 1"  (1.854 m)     Physical Exam Vitals reviewed.  Constitutional:  Appearance: He is well-developed.  HENT:     Head: Normocephalic and atraumatic.  Neck:     Vascular: No carotid bruit or JVD.  Cardiovascular:     Rate and Rhythm: Normal rate and regular rhythm.     Heart sounds: Normal heart sounds. No murmur heard. Pulmonary:     Effort: Pulmonary effort is normal.     Breath sounds: Normal breath sounds. No rales.  Musculoskeletal:     Right lower leg: No edema.     Left lower leg: No edema.  Skin:    General: Skin is warm and dry.  Neurological:     Mental Status: He is alert and oriented to person, place, and time.  Psychiatric:        Mood and Affect: Mood normal.     Assessment & Plan:  David Crane is a 57 y.o. male . Type 2 diabetes mellitus with hyperglycemia, without long-term current use of insulin - Plan: Microalbumin / creatinine urine ratio, Comprehensive metabolic panel, Hemoglobin A1c, Lipid panel, CANCELED: POCT Glucose (Device for Home Use)  Hyperlipidemia, unspecified hyperlipidemia type  Tolerating current med regimen, has regular follow-up with retina specialist, asked to have note sent to update retinopathy screening.  Denies hypoglycemia.  Continue same meds regimen with labs above, adjustment in plan accordingly.  32-month follow-up.  No orders  of the defined types were placed in this encounter.  Patient Instructions  Please have your eye doctor send me a note at your next visit so we can document that you are up to date on retinopathy testing/treatment.   No med changes, and if any concerns on labs I will let you know.   Keep up the good work - weight is down a few more pounds today.     Signed,   [name], MD [place] 12/17/22 5:12 PM

## 2022-12-17 NOTE — Patient Instructions (Addendum)
Please have your eye doctor send me a note at your next visit so we can document that you are up to date on retinopathy testing/treatment.   No med changes, and if any concerns on labs I will let you know.   Keep up the good work - weight is down a few more pounds today.

## 2022-12-18 LAB — COMPREHENSIVE METABOLIC PANEL
ALT: 19 U/L (ref 0–53)
AST: 24 U/L (ref 0–37)
Albumin: 4.6 g/dL (ref 3.5–5.2)
Alkaline Phosphatase: 41 U/L (ref 39–117)
BUN: 35 mg/dL — ABNORMAL HIGH (ref 6–23)
CO2: 29 mEq/L (ref 19–32)
Calcium: 9.3 mg/dL (ref 8.4–10.5)
Chloride: 101 mEq/L (ref 96–112)
Creatinine, Ser: 1.48 mg/dL (ref 0.40–1.50)
GFR: 52.43 mL/min — ABNORMAL LOW (ref >60.00)
Glucose, Bld: 71 mg/dL (ref 70–99)
Potassium: 4.7 mEq/L (ref 3.5–5.1)
Sodium: 136 mEq/L (ref 135–145)
Total Bilirubin: 0.7 mg/dL (ref 0.2–1.2)
Total Protein: 7.2 g/dL (ref 6.0–8.3)

## 2022-12-18 LAB — MICROALBUMIN / CREATININE URINE RATIO
Creatinine,U: 156.7 mg/dL
Microalb Creat Ratio: 0.5 mg/g (ref 0.0–30.0)
Microalb, Ur: 0.8 mg/dL (ref 0.0–1.9)

## 2022-12-18 LAB — LIPID PANEL
Cholesterol: 92 mg/dL (ref 0–200)
HDL: 36.9 mg/dL — ABNORMAL LOW (ref >39.00)
LDL Cholesterol: 26 mg/dL (ref 0–99)
NonHDL: 55.52
Total CHOL/HDL Ratio: 3
Triglycerides: 147 mg/dL (ref 0.0–149.0)
VLDL: 29.4 mg/dL (ref 0.0–40.0)

## 2022-12-18 LAB — HEMOGLOBIN A1C: Hgb A1c MFr Bld: 6.7 % — ABNORMAL HIGH (ref 4.6–6.5)

## 2022-12-23 ENCOUNTER — Other Ambulatory Visit: Payer: Self-pay | Admitting: Family Medicine

## 2022-12-23 DIAGNOSIS — R944 Abnormal results of kidney function studies: Secondary | ICD-10-CM

## 2022-12-23 DIAGNOSIS — E1165 Type 2 diabetes mellitus with hyperglycemia: Secondary | ICD-10-CM

## 2022-12-23 NOTE — Progress Notes (Signed)
See labs 

## 2022-12-24 DIAGNOSIS — E113313 Type 2 diabetes mellitus with moderate nonproliferative diabetic retinopathy with macular edema, bilateral: Secondary | ICD-10-CM | POA: Diagnosis not present

## 2022-12-24 DIAGNOSIS — H35033 Hypertensive retinopathy, bilateral: Secondary | ICD-10-CM | POA: Diagnosis not present

## 2022-12-24 DIAGNOSIS — H43821 Vitreomacular adhesion, right eye: Secondary | ICD-10-CM | POA: Diagnosis not present

## 2022-12-24 LAB — HM DIABETES EYE EXAM

## 2023-02-07 DIAGNOSIS — E113313 Type 2 diabetes mellitus with moderate nonproliferative diabetic retinopathy with macular edema, bilateral: Secondary | ICD-10-CM | POA: Diagnosis not present

## 2023-02-13 ENCOUNTER — Telehealth: Payer: Self-pay | Admitting: Family Medicine

## 2023-02-13 DIAGNOSIS — M545 Low back pain, unspecified: Secondary | ICD-10-CM

## 2023-02-13 MED ORDER — METHOCARBAMOL 500 MG PO TABS
500.0000 mg | ORAL_TABLET | Freq: Four times a day (QID) | ORAL | 0 refills | Status: DC
Start: 2023-02-13 — End: 2023-02-28

## 2023-02-13 NOTE — Telephone Encounter (Signed)
I have informed the pt of the refill and of Dr Neva Seat message

## 2023-02-13 NOTE — Telephone Encounter (Signed)
Back pain flare discussed previously, I will refill Robaxin temporarily and can discuss further at his follow-up appointment next week.

## 2023-02-13 NOTE — Telephone Encounter (Signed)
Encourage patient to contact the pharmacy for refills or they can request refills through [facility]   WHAT PHARMACY WOULD THEY LIKE THIS SENT TO:  [facility] [address] [place] - [address]    MEDICATION NAME & DOSE: methocarbamol (ROBAXIN) 500 MG tablet  NOTES/COMMENTS FROM PATIENT: Pt has 2 tabs left had a flare up and would like a  new prescription.  Physical scheduled next week.    Front office please notify patient: It takes 48-72 hours to process rx refill requests Ask patient to call pharmacy to ensure rx is ready before heading there.

## 2023-02-20 ENCOUNTER — Ambulatory Visit (INDEPENDENT_AMBULATORY_CARE_PROVIDER_SITE_OTHER): Payer: BC Managed Care – PPO | Admitting: Family Medicine

## 2023-02-20 ENCOUNTER — Encounter: Payer: Self-pay | Admitting: Family Medicine

## 2023-02-20 VITALS — BP 116/68 | HR 89 | Temp 98.4°F | Ht 73.0 in | Wt 244.6 lb

## 2023-02-20 DIAGNOSIS — Z125 Encounter for screening for malignant neoplasm of prostate: Secondary | ICD-10-CM

## 2023-02-20 DIAGNOSIS — Z0000 Encounter for general adult medical examination without abnormal findings: Secondary | ICD-10-CM | POA: Diagnosis not present

## 2023-02-20 DIAGNOSIS — R944 Abnormal results of kidney function studies: Secondary | ICD-10-CM

## 2023-02-20 NOTE — Progress Notes (Signed)
Subjective:  Patient ID: David Crane, male    DOB: 1966-02-22  Age: 57 y.o. MRN: [id number]  CC:  Chief Complaint  Patient presents with   Annual Exam    Pt here for annual due to new insurance has brought a form to be filled out     HPI David Crane presents for Annual Exam  History of diabetes, discussed in April.  34-month follow-up planned, no med changes at that time.Creatinine had increased slightly at his April 1 visit with level of 1.48 compared to 1.19 previously.  eGFR 68 decreased to 52.  A1c had improved at 6.7.  Lipids were stable and normal microalbumin ratio.  Recommended lab only visit within 1 to 2 weeks, has not yet performed.  Lab Results  Component Value Date   CREATININE 1.48 12/17/2022         02/20/2023    3:05 PM 12/17/2022    4:03 PM 10/24/2022    3:27 PM 09/05/2022    4:28 PM 05/30/2022    4:35 PM  Depression screen PHQ 2/9  Decreased Interest 0 0 0 0 0  Down, Depressed, Hopeless 0 0 0 0 0  PHQ - 2 Score 0 0 0 0 0  Altered sleeping 0 0 0 0 0  Tired, decreased energy 0 0 0 0 0  Change in appetite 0 0 0 0 0  Feeling bad or failure about yourself  0 0 0 0 0  Trouble concentrating 0 0 0 0 0  Moving slowly or fidgety/restless 0 0 0 0 0  Suicidal thoughts 0 0 0 0 0  PHQ-9 Score 0 0 0 0 0  Difficult doing work/chores  Not difficult at all       Health Maintenance  Topic Date Due   Zoster Vaccines- Shingrix (1 of 2) 03/18/2023 (Originally 02/02/2016)   INFLUENZA VACCINE  04/18/2023   HEMOGLOBIN A1C  06/18/2023   Diabetic kidney evaluation - eGFR measurement  12/17/2023   Diabetic kidney evaluation - Urine ACR  12/17/2023   OPHTHALMOLOGY EXAM  12/24/2023   FOOT EXAM  02/20/2024   Colonoscopy  07/18/2028   DTaP/Tdap/Td (2 - Td or Tdap) 02/24/2029   HPV VACCINES  Aged Out   COVID-19 Vaccine  Discontinued   Hepatitis C Screening  Discontinued   HIV Screening  Discontinued  Colonoscopy 07/2018, 5mm hyperplastic polyp, repeat in 10 yrs.   Prostate: does not have family history of prostate cancer The natural history of prostate cancer and ongoing controversy regarding screening and potential treatment outcomes of prostate cancer has been discussed with the patient. The meaning of a false positive PSA and a false negative PSA has been discussed. He indicates understanding of the limitations of this screening test and wishes to proceed with screening PSA testing. No results found for: "PSA1", "PSA"  Immunization History  Administered Date(s) Administered   Moderna Sars-Covid-2 Vaccination 12/04/2019, 01/05/2020   Tdap 02/25/2019  Pneumonia vaccine - declined.  Flu vaccine - declined.  Covid vaccine - declines booster.  Shingrix - declines at this time.   No results found. Regular optho eval - last visit a few weeks ago.   Dental: every 6 months  Alcohol: none  Tobacco: none  Exercise: physical work with his job. Some stretches in the morning.  Some foot neuropathy - treated with gabapentin. Has follow up scheduled in July 10th.   History Patient Active Problem List   Diagnosis Date Noted   Diabetic nephropathy (HCC) 05/01/2017  Diabetes (HCC) 05/01/2017   Peripheral neuropathy 05/01/2017   Past Medical History:  Diagnosis Date   Diabetes mellitus without complication (HCC)    GERD (gastroesophageal reflux disease)    Hyperlipidemia    Hypertension    Past Surgical History:  Procedure Laterality Date   Growth removed Left    TONSILLECTOMY     WISDOM TOOTH EXTRACTION     Allergies  Allergen Reactions   Ace Inhibitors Other (See Comments)    Hypotension, fatigue with attempt at low dose ACE-I and ARB for microalbuminuria.    Angiotensin Receptor Blockers Other (See Comments)    Hypotension, fatigue with attempt at low dose ACE-I and ARB for microalbuminuria   Prior to Admission medications   Medication Sig Start Date End Date Taking? Authorizing Provider  aspirin EC 81 MG tablet Take 1 tablet (81 mg  total) by mouth daily. Swallow whole. 06/15/20  Yes O'Neal, [name], MD  atorvastatin (LIPITOR) 40 MG tablet Take 1 tablet (40 mg total) by mouth daily. 05/30/22  Yes Shade Flood, MD  blood glucose meter kit and supplies Dispense based on patient and insurance preference. Check twice per day. FOR ICD-9 250.00, 250.01). 04/29/17  Yes Shade Flood, MD  empagliflozin (JARDIANCE) 10 MG TABS tablet Take 1 tablet (10 mg total) by mouth daily before breakfast. 09/05/22  Yes Shade Flood, MD  gabapentin (NEURONTIN) 300 MG capsule Take 1-2 capsules (300-600 mg total) by mouth 3 (three) times daily as needed. 09/05/22  Yes Shade Flood, MD  glipiZIDE (GLUCOTROL) 5 MG tablet Take 0.5 tablets (2.5 mg total) by mouth 2 (two) times daily before a meal. 09/05/22  Yes Shade Flood, MD  glucose blood test strip Use as instructed 03/26/22  Yes Shade Flood, MD  Lancets 30G MISC Check sugar twice a day before meals 03/26/22  Yes Shade Flood, MD  metFORMIN (GLUCOPHAGE) 1000 MG tablet Take 1 tablet (1,000 mg total) by mouth 2 (two) times daily with a meal. 05/30/22  Yes Shade Flood, MD  doxycycline (VIBRA-TABS) 100 MG tablet Take 1 tablet (100 mg total) by mouth 2 (two) times daily. Patient not taking: Reported on 12/17/2022 10/24/22   Shade Flood, MD  methocarbamol (ROBAXIN) 500 MG tablet Take 1 tablet (500 mg total) by mouth 4 (four) times daily. Patient not taking: Reported on 02/20/2023 02/13/23   Shade Flood, MD  scopolamine (TRANSDERM SCOP, 1.5 MG,) 1 MG/3DAYS Place 1 patch (1.5 mg total) onto the skin every 3 (three) days. Patient not taking: Reported on 12/17/2022 10/24/22   Shade Flood, MD   Social History   Socioeconomic History   Marital status: Married    Spouse name: Not on file   Number of children: 2   Years of education: Not on file   Highest education level: Associate degree: occupational, Scientist, product/process development, or vocational program  Occupational History    Occupation: RT driver  Tobacco Use   Smoking status: Never   Smokeless tobacco: Never  Vaping Use   Vaping Use: Never used  Substance and Sexual Activity   Alcohol use: Yes    Comment: social   Drug use: No   Sexual activity: Yes  Other Topics Concern   Not on file  Social History Narrative   Not on file   Social Determinants of Health   Financial Resource Strain: Low Risk  (12/16/2022)   Overall Financial Resource Strain (CARDIA)    Difficulty of Paying Living Expenses: Not  hard at all  Food Insecurity: No Food Insecurity (12/16/2022)   Hunger Vital Sign    Worried About Running Out of Food in the Last Year: Never true    Ran Out of Food in the Last Year: Never true  Transportation Needs: No Transportation Needs (12/16/2022)   PRAPARE - Administrator, Civil Service (Medical): No    Lack of Transportation (Non-Medical): No  Physical Activity: Unknown (12/16/2022)   Exercise Vital Sign    Days of Exercise per Week: Patient declined    Minutes of Exercise per Session: Not on file  Stress: No Stress Concern Present (12/16/2022)   [employer] of Occupational Health - Occupational Stress Questionnaire    Feeling of Stress : Not at all  Social Connections: Moderately Integrated (12/16/2022)   Social Connection and Isolation Panel [NHANES]    Frequency of Communication with Friends and Family: More than three times a week    Frequency of Social Gatherings with Friends and Family: Twice a week    Attends Religious Services: 1 to 4 times per year    Active Member of [employer] or Organizations: No    Attends Engineer, structural: Not on file    Marital Status: Married  Catering manager Violence: Not on file    Review of Systems 13 point review of systems per patient health survey noted.  Negative other than as indicated above or in HPI.   Objective:   Vitals:   02/20/23 1515  BP: 116/68  Pulse: 89  Temp: 98.4 F (36.9 C)  TempSrc: Temporal  SpO2: 95%   Weight: 244 lb 9.6 oz (110.9 kg)  Height: 6\' 1"  (1.854 m)     Physical Exam Vitals reviewed.  Constitutional:      Appearance: He is well-developed.  HENT:     Head: Normocephalic and atraumatic.     Right Ear: External ear normal.     Left Ear: External ear normal.  Eyes:     Conjunctiva/sclera: Conjunctivae normal.     Pupils: Pupils are equal, round, and reactive to light.  Neck:     Thyroid: No thyromegaly.  Cardiovascular:     Rate and Rhythm: Normal rate and regular rhythm.     Heart sounds: Normal heart sounds.  Pulmonary:     Effort: Pulmonary effort is normal. No respiratory distress.     Breath sounds: Normal breath sounds. No wheezing.  Abdominal:     General: There is no distension.     Palpations: Abdomen is soft.     Tenderness: There is no abdominal tenderness.  Musculoskeletal:        General: No tenderness. Normal range of motion.     Cervical back: Normal range of motion and neck supple.  Lymphadenopathy:     Cervical: No cervical adenopathy.  Skin:    General: Skin is warm and dry.  Neurological:     Mental Status: He is alert and oriented to person, place, and time.     Deep Tendon Reflexes: Reflexes are normal and symmetric.  Psychiatric:        Behavior: Behavior normal.        Assessment & Plan:  David Crane is a 57 y.o. male . Annual physical exam  - -anticipatory guidance as below in AVS, screening labs above. Health maintenance items as above in HPI discussed/recommended as applicable.   Decreased GFR - Plan: Basic metabolic panel  -Mild elevation in creatinine last visit, check repeat labs.  Adjust plan accordingly.  Screening for prostate cancer - Plan: PSA   No orders of the defined types were placed in this encounter.  Patient Instructions  I will recheck kidney function test that was elevated last visit, and let me know if there are any concerns on your labs.  Please follow-up for routine/chronic medical problems as  planned but let me know if there are questions or any new symptoms or concerns prior.  Take care.   Preventive Care 4-6 Years Old, Male Preventive care refers to lifestyle choices and visits with your health care provider that can promote health and wellness. Preventive care visits are also called wellness exams. What can I expect for my preventive care visit? Counseling During your preventive care visit, your health care provider may ask about your: Medical history, including: Past medical problems. Family medical history. Current health, including: Emotional well-being. Home life and relationship well-being. Sexual activity. Lifestyle, including: Alcohol, nicotine or tobacco, and drug use. Access to firearms. Diet, exercise, and sleep habits. Safety issues such as seatbelt and bike helmet use. Sunscreen use. Work and work Astronomer. Physical exam Your health care provider will check your: Height and weight. These may be used to calculate your BMI (body mass index). BMI is a measurement that tells if you are at a healthy weight. Waist circumference. This measures the distance around your waistline. This measurement also tells if you are at a healthy weight and may help predict your risk of certain diseases, such as type 2 diabetes and high blood pressure. Heart rate and blood pressure. Body temperature. Skin for abnormal spots. What immunizations do I need?  Vaccines are usually given at various ages, according to a schedule. Your health care provider will recommend vaccines for you based on your age, medical history, and lifestyle or other factors, such as travel or where you work. What tests do I need? Screening Your health care provider may recommend screening tests for certain conditions. This may include: Lipid and cholesterol levels. Diabetes screening. This is done by checking your blood sugar (glucose) after you have not eaten for a while (fasting). Hepatitis B  test. Hepatitis C test. HIV (human immunodeficiency virus) test. STI (sexually transmitted infection) testing, if you are at risk. Lung cancer screening. Prostate cancer screening. Colorectal cancer screening. Talk with your health care provider about your test results, treatment options, and if necessary, the need for more tests. Follow these instructions at home: Eating and drinking  Eat a diet that includes fresh fruits and vegetables, whole grains, lean protein, and low-fat dairy products. Take vitamin and mineral supplements as recommended by your health care provider. Do not drink alcohol if your health care provider tells you not to drink. If you drink alcohol: Limit how much you have to 0-2 drinks a day. Know how much alcohol is in your drink. In the [place] one drink equals one 12 oz bottle of beer (355 mL), one 5 oz glass of wine (148 mL), or one 1 oz glass of hard liquor (44 mL). Lifestyle Brush your teeth every morning and night with fluoride toothpaste. Floss one time each day. Exercise for at least 30 minutes 5 or more days each week. Do not use any products that contain nicotine or tobacco. These products include cigarettes, chewing tobacco, and vaping devices, such as e-cigarettes. If you need help quitting, ask your health care provider. Do not use drugs. If you are sexually active, practice safe sex. Use a condom or other form of protection  to prevent STIs. Take aspirin only as told by your health care provider. Make sure that you understand how much to take and what form to take. Work with your health care provider to find out whether it is safe and beneficial for you to take aspirin daily. Find healthy ways to manage stress, such as: Meditation, yoga, or listening to music. Journaling. Talking to a trusted person. Spending time with friends and family. Minimize exposure to UV radiation to reduce your risk of skin cancer. Safety Always wear your seat belt while  driving or riding in a vehicle. Do not drive: If you have been drinking alcohol. Do not ride with someone who has been drinking. When you are tired or distracted. While texting. If you have been using any mind-altering substances or drugs. Wear a helmet and other protective equipment during sports activities. If you have firearms in your house, make sure you follow all gun safety procedures. What's next? Go to your health care provider once a year for an annual wellness visit. Ask your health care provider how often you should have your eyes and teeth checked. Stay up to date on all vaccines. This information is not intended to replace advice given to you by your health care provider. Make sure you discuss any questions you have with your health care provider. Document Revised: 03/01/2021 Document Reviewed: 03/01/2021 Elsevier Patient Education  2024 Elsevier Inc.     Signed,   Meredith Staggers, MD [facility], [facility] 02/20/23 3:44 PM

## 2023-02-20 NOTE — Patient Instructions (Signed)
I will recheck kidney function test that was elevated last visit, and let me know if there are any concerns on your labs.  Please follow-up for routine/chronic medical problems as planned but let me know if there are questions or any new symptoms or concerns prior.  Take care.   Preventive Care 21-57 Years Old, Male Preventive care refers to lifestyle choices and visits with your health care provider that can promote health and wellness. Preventive care visits are also called wellness exams. What can I expect for my preventive care visit? Counseling During your preventive care visit, your health care provider may ask about your: Medical history, including: Past medical problems. Family medical history. Current health, including: Emotional well-being. Home life and relationship well-being. Sexual activity. Lifestyle, including: Alcohol, nicotine or tobacco, and drug use. Access to firearms. Diet, exercise, and sleep habits. Safety issues such as seatbelt and bike helmet use. Sunscreen use. Work and work Astronomer. Physical exam Your health care provider will check your: Height and weight. These may be used to calculate your BMI (body mass index). BMI is a measurement that tells if you are at a healthy weight. Waist circumference. This measures the distance around your waistline. This measurement also tells if you are at a healthy weight and may help predict your risk of certain diseases, such as type 2 diabetes and high blood pressure. Heart rate and blood pressure. Body temperature. Skin for abnormal spots. What immunizations do I need?  Vaccines are usually given at various ages, according to a schedule. Your health care provider will recommend vaccines for you based on your age, medical history, and lifestyle or other factors, such as travel or where you work. What tests do I need? Screening Your health care provider may recommend screening tests for certain conditions. This may  include: Lipid and cholesterol levels. Diabetes screening. This is done by checking your blood sugar (glucose) after you have not eaten for a while (fasting). Hepatitis B test. Hepatitis C test. HIV (human immunodeficiency virus) test. STI (sexually transmitted infection) testing, if you are at risk. Lung cancer screening. Prostate cancer screening. Colorectal cancer screening. Talk with your health care provider about your test results, treatment options, and if necessary, the need for more tests. Follow these instructions at home: Eating and drinking  Eat a diet that includes fresh fruits and vegetables, whole grains, lean protein, and low-fat dairy products. Take vitamin and mineral supplements as recommended by your health care provider. Do not drink alcohol if your health care provider tells you not to drink. If you drink alcohol: Limit how much you have to 0-2 drinks a day. Know how much alcohol is in your drink. In the [place] one drink equals one 12 oz bottle of beer (355 mL), one 5 oz glass of wine (148 mL), or one 1 oz glass of hard liquor (44 mL). Lifestyle Brush your teeth every morning and night with fluoride toothpaste. Floss one time each day. Exercise for at least 30 minutes 5 or more days each week. Do not use any products that contain nicotine or tobacco. These products include cigarettes, chewing tobacco, and vaping devices, such as e-cigarettes. If you need help quitting, ask your health care provider. Do not use drugs. If you are sexually active, practice safe sex. Use a condom or other form of protection to prevent STIs. Take aspirin only as told by your health care provider. Make sure that you understand how much to take and what form to take. Work with  your health care provider to find out whether it is safe and beneficial for you to take aspirin daily. Find healthy ways to manage stress, such as: Meditation, yoga, or listening to music. Journaling. Talking to a  trusted person. Spending time with friends and family. Minimize exposure to UV radiation to reduce your risk of skin cancer. Safety Always wear your seat belt while driving or riding in a vehicle. Do not drive: If you have been drinking alcohol. Do not ride with someone who has been drinking. When you are tired or distracted. While texting. If you have been using any mind-altering substances or drugs. Wear a helmet and other protective equipment during sports activities. If you have firearms in your house, make sure you follow all gun safety procedures. What's next? Go to your health care provider once a year for an annual wellness visit. Ask your health care provider how often you should have your eyes and teeth checked. Stay up to date on all vaccines. This information is not intended to replace advice given to you by your health care provider. Make sure you discuss any questions you have with your health care provider. Document Revised: 03/01/2021 Document Reviewed: 03/01/2021 Elsevier Patient Education  2024 [employer].

## 2023-02-21 LAB — BASIC METABOLIC PANEL
BUN: 33 mg/dL — ABNORMAL HIGH (ref 6–23)
CO2: 28 mEq/L (ref 19–32)
Calcium: 9.2 mg/dL (ref 8.4–10.5)
Chloride: 102 mEq/L (ref 96–112)
Creatinine, Ser: 1.2 mg/dL (ref 0.40–1.50)
GFR: 67.35 mL/min (ref >60.00)
Glucose, Bld: 111 mg/dL — ABNORMAL HIGH (ref 70–99)
Potassium: 4.1 mEq/L (ref 3.5–5.1)
Sodium: 140 mEq/L (ref 135–145)

## 2023-02-21 LAB — PSA: PSA: 1.12 ng/mL (ref 0.10–4.00)

## 2023-02-28 ENCOUNTER — Encounter: Payer: Self-pay | Admitting: Family Medicine

## 2023-02-28 ENCOUNTER — Ambulatory Visit (INDEPENDENT_AMBULATORY_CARE_PROVIDER_SITE_OTHER): Payer: BC Managed Care – PPO | Admitting: Family Medicine

## 2023-02-28 VITALS — BP 138/62 | HR 81 | Temp 97.8°F | Ht 73.0 in | Wt 240.2 lb

## 2023-02-28 DIAGNOSIS — L089 Local infection of the skin and subcutaneous tissue, unspecified: Secondary | ICD-10-CM | POA: Diagnosis not present

## 2023-02-28 MED ORDER — DOXYCYCLINE HYCLATE 100 MG PO TABS: 100.0000 mg | ORAL_TABLET | Freq: Two times a day (BID) | ORAL | 0 refills | Status: AC

## 2023-02-28 MED ORDER — CEFTRIAXONE SODIUM 1 G IJ SOLR
1.0000 g | Freq: Once | INTRAMUSCULAR | Status: AC
Start: 2023-02-28 — End: 2023-02-28
  Administered 2023-02-28: 1 g via INTRAMUSCULAR

## 2023-02-28 MED ORDER — TRAMADOL HCL 50 MG PO TABS
50.0000 mg | ORAL_TABLET | Freq: Three times a day (TID) | ORAL | 0 refills | Status: AC | PRN
Start: 2023-02-28 — End: 2023-03-05

## 2023-02-28 NOTE — Addendum Note (Signed)
Addended by: Alveria Apley on: 02/28/2023 09:55 AM   Modules accepted: Orders

## 2023-02-28 NOTE — Progress Notes (Addendum)
Acute Office Visit   Subjective:  Patient ID: David Crane, male    DOB: 14-Nov-1965, 57 y.o.   MRN: [id number]  Chief Complaint  Patient presents with   Hand Pain    Pt is here today with left pointer finger infection Pt reports this has been going on for awhile and he has applied ointment and would go away. OTC Tylenol    HPI: Patient is a 57 year old caucasian male that presents with left index finger infection.  Patient reports he had a scab on his left index finger at the DIP joint that has been present for a while. He reports he uses gloves at work and thinks that they were dirty on the inside that caused the scab to get infected. He reports over the weekend, it started getting red. He put a band-aid and OTC antibiotic cream. On Monday and Tuesday, it appeared to be improved. Then, yesterday it started being painful with erythema and edema in the afternoon. Denies any fever, but reports he just didn't feel real good yesterday.   ROS See HPI above      Objective:   BP 138/62   Pulse 81   Temp 97.8 F (36.6 C)   Ht 6\' 1"  (1.854 m)   Wt 240 lb 4 oz (109 kg)   SpO2 98%   BMI 31.70 kg/m    Physical Exam Vitals reviewed.  Constitutional:      General: He is not in acute distress.    Appearance: Normal appearance. He is not ill-appearing, toxic-appearing or diaphoretic.  Eyes:     General:        Right eye: No discharge.        Left eye: No discharge.     Conjunctiva/sclera: Conjunctivae normal.  Cardiovascular:     Rate and Rhythm: Normal rate and regular rhythm.     Heart sounds: Normal heart sounds. No murmur heard.    No friction rub. No gallop.  Pulmonary:     Effort: Pulmonary effort is normal. No respiratory distress.     Breath sounds: Normal breath sounds.  Musculoskeletal:        General: Normal range of motion.  Skin:    General: Skin is warm and dry.     Findings: Abscess (Left index finger) present.     Comments: Left index finger: DIP joint is  swollen below PIP joint, erythema around the DIP joint, on the lateral side of joint-there is a scab with center being dark red. Non scaly skin, no drianage.Patient is able not able to bend DIP joint, but can bend PIP joint.   Neurological:     General: No focal deficit present.     Mental Status: He is alert and oriented to person, place, and time. Mental status is at baseline.  Psychiatric:        Mood and Affect: Mood normal.        Behavior: Behavior normal.        Thought Content: Thought content normal.        Judgment: Judgment normal.          Assessment & Plan:  Infection of index finger -     Ambulatory referral to Hand Surgery  -Consulted with Dr. Herma Carson, his primary care provider. Placed a stat referral to hand surgery due to the intensive swelling, redness over DIP joint of the left index finger., and unable to move DIP joint.  Also, he is a diabetic  which is more concerning with infections  -START Doxycyline 100mg  tablet, 1 tablet twice a day for 7 days. Please take two doses today and one dose in the morning before going to hand surgeon.  -Ordered Rocephin 1gm antibiotic in office.  -Ordered Tramadol 50mg  tablet, 1 tablet every 8 hours as needed for severe pain for 5 days. Medication can cause you to be drowsy. Please do not operate machinery or drive while taking medication. -Limit finger use until you are elevated tomorrow by hand surgeon.  -You have an appointment on tomorrow, Friday June 14th at 9:30, but you need to be in office at 9:10am in the morning. [facility] [address] [place] [address] [phone] -If your symptoms become worse before appointment or you develop a fever, you need to go to the emergency department.  -You have been given a work note to return back on Monday, 06/17.  Zandra Abts, NP

## 2023-02-28 NOTE — Patient Instructions (Addendum)
-  START Doxycyline  100mg  tablet, 1 tablet twice a day for 7 days. Please take two doses today and one dose in the morning before going to hand surgeon.  -You received Rocephin 1gm antibiotic in office.  -Ordered Tramadol 50mg  tablet, 1 tablet every 8 hours as needed for severe pain for 5 days. Medication can cause you to be drowsy. Please do not operate machinery or drive while taking medication.  -Limit finger use until you are elevated tomorrow by hand surgeon.  -You have an appointment on tomorrow, Friday June 14th at 9:30, but you need to be in office at 9:10am in the morning. [facility] [address] [place] [address] [phone] -If your symptoms become worse before appointment or you develop a fever, you need to go to the emergency department.  -You have been given a work note to return back on Monday, 06/17.

## 2023-03-01 ENCOUNTER — Other Ambulatory Visit: Payer: Self-pay | Admitting: Orthopedic Surgery

## 2023-03-01 ENCOUNTER — Inpatient Hospital Stay: Admit: 2023-03-01 | Payer: BC Managed Care – PPO | Admitting: Orthopedic Surgery

## 2023-03-01 DIAGNOSIS — L089 Local infection of the skin and subcutaneous tissue, unspecified: Secondary | ICD-10-CM | POA: Insufficient documentation

## 2023-03-01 DIAGNOSIS — L02512 Cutaneous abscess of left hand: Secondary | ICD-10-CM | POA: Diagnosis not present

## 2023-03-01 SURGERY — INCISION AND DRAINAGE
Anesthesia: Choice | Laterality: Left

## 2023-03-01 NOTE — Progress Notes (Signed)
Spoke with patient regarding arrival time.  Patient states he ate 3 slices of bacon and oatmeal for breakfast at 0800.  Anesthesia states surgery cannot go before 1600.  OR informed.

## 2023-03-08 DIAGNOSIS — L089 Local infection of the skin and subcutaneous tissue, unspecified: Secondary | ICD-10-CM | POA: Diagnosis not present

## 2023-03-20 DIAGNOSIS — L089 Local infection of the skin and subcutaneous tissue, unspecified: Secondary | ICD-10-CM | POA: Diagnosis not present

## 2023-03-25 ENCOUNTER — Ambulatory Visit: Payer: BC Managed Care – PPO | Admitting: Family Medicine

## 2023-03-27 ENCOUNTER — Ambulatory Visit: Payer: BC Managed Care – PPO | Admitting: Family Medicine

## 2023-03-28 DIAGNOSIS — E113313 Type 2 diabetes mellitus with moderate nonproliferative diabetic retinopathy with macular edema, bilateral: Secondary | ICD-10-CM | POA: Diagnosis not present

## 2023-03-29 DIAGNOSIS — L089 Local infection of the skin and subcutaneous tissue, unspecified: Secondary | ICD-10-CM | POA: Diagnosis not present

## 2023-04-11 ENCOUNTER — Ambulatory Visit (INDEPENDENT_AMBULATORY_CARE_PROVIDER_SITE_OTHER): Payer: BC Managed Care – PPO | Admitting: Family Medicine

## 2023-04-11 ENCOUNTER — Encounter: Payer: Self-pay | Admitting: Family Medicine

## 2023-04-11 VITALS — BP 118/60 | HR 89 | Temp 97.8°F | Ht 73.0 in | Wt 243.0 lb

## 2023-04-11 DIAGNOSIS — R809 Proteinuria, unspecified: Secondary | ICD-10-CM | POA: Diagnosis not present

## 2023-04-11 DIAGNOSIS — Z7984 Long term (current) use of oral hypoglycemic drugs: Secondary | ICD-10-CM | POA: Diagnosis not present

## 2023-04-11 DIAGNOSIS — E1129 Type 2 diabetes mellitus with other diabetic kidney complication: Secondary | ICD-10-CM

## 2023-04-11 DIAGNOSIS — G629 Polyneuropathy, unspecified: Secondary | ICD-10-CM

## 2023-04-11 DIAGNOSIS — M79672 Pain in left foot: Secondary | ICD-10-CM

## 2023-04-11 DIAGNOSIS — E785 Hyperlipidemia, unspecified: Secondary | ICD-10-CM

## 2023-04-11 DIAGNOSIS — R739 Hyperglycemia, unspecified: Secondary | ICD-10-CM

## 2023-04-11 MED ORDER — GABAPENTIN 300 MG PO CAPS
300.0000 mg | ORAL_CAPSULE | Freq: Three times a day (TID) | ORAL | 1 refills | Status: DC | PRN
Start: 2023-04-11 — End: 2023-11-04

## 2023-04-11 MED ORDER — ATORVASTATIN CALCIUM 40 MG PO TABS: 40.0000 mg | ORAL_TABLET | Freq: Every day | ORAL | 2 refills | Status: DC

## 2023-04-11 MED ORDER — EMPAGLIFLOZIN 10 MG PO TABS: 10.0000 mg | ORAL_TABLET | Freq: Every day | ORAL | 1 refills | Status: DC

## 2023-04-11 MED ORDER — METFORMIN HCL 1000 MG PO TABS: 1000.0000 mg | ORAL_TABLET | Freq: Two times a day (BID) | ORAL | 2 refills | Status: DC

## 2023-04-11 NOTE — Progress Notes (Addendum)
Subjective:  Patient ID: David Crane, male    DOB: Oct 09, 1965  Age: 57 y.o. MRN: [id number]  CC:  Chief Complaint  Patient presents with   Medical Management of Chronic Issues   Foot Pain    Outer aspect of the foot is becoming painful unsure what he can do for this does wear steele toed shoes constantly, has been taking 3-5 gabapentin depending on pain daily     HPI David Crane presents for   Diabetes: Complicated by peripheral neuropathy, microalbuminuria, retinopathy.  Treated with metformin 1000 mg twice daily, Jardiance 10 mg daily, glipizide 2.5 mg twice daily with prior hypoglycemia precautions.  Gabapentin 1 in the morning, 2 at night at his April visit.  Has had increased pain on the outside of his foot  -taking 3-5 gabapentin daily. 1 in am, 1 at noon, sometimes at 5pm and 2 at bedtime. Mostly pain at outside of left foot - past few months. More with activity.  No known specific injury. Uses that foot to kick cylinders.  Wears steel toe shoes. No new side effects of lightheadedness of gabapentin.  Takes ibuprofen 600mg  in morning - helps soreness. Gabapentin at noon seems better.  Intolerant to ACE inhibitors.  He is on statin. Home readings - 93 fasting, 79 in afternoon, 69 lowest - asymptomatic. No 200's or reading 200, only 1 reading over 100.   Microalbumin: 12/17/22.  Optho, foot exam, pneumovax: up to date.   Lab Results  Component Value Date   HGBA1C 6.7 (H) 12/17/2022   HGBA1C 7.0 (H) 09/05/2022   HGBA1C 7.4 (H) 06/05/2022   Lab Results  Component Value Date   MICROALBUR 0.8 12/17/2022   LDLCALC 26 12/17/2022   CREATININE 1.20 02/20/2023    History Patient Active Problem List   Diagnosis Date Noted   Diabetic nephropathy (HCC) 05/01/2017   Diabetes (HCC) 05/01/2017   Peripheral neuropathy 05/01/2017   Past Medical History:  Diagnosis Date   Diabetes mellitus without complication (HCC)    GERD (gastroesophageal reflux disease)     Hyperlipidemia    Hypertension    Past Surgical History:  Procedure Laterality Date   Growth removed Left    TONSILLECTOMY     WISDOM TOOTH EXTRACTION     Allergies  Allergen Reactions   Ace Inhibitors Other (See Comments)    Hypotension, fatigue with attempt at low dose ACE-I and ARB for microalbuminuria.    Angiotensin Receptor Blockers Other (See Comments)    Hypotension, fatigue with attempt at low dose ACE-I and ARB for microalbuminuria   Prior to Admission medications   Medication Sig Start Date End Date Taking? Authorizing Provider  aspirin EC 81 MG tablet Take 1 tablet (81 mg total) by mouth daily. Swallow whole. 06/15/20  Yes O'Neal, [name], MD  atorvastatin (LIPITOR) 40 MG tablet Take 1 tablet (40 mg total) by mouth daily. 05/30/22  Yes Shade Flood, MD  blood glucose meter kit and supplies Dispense based on patient and insurance preference. Check twice per day. FOR ICD-9 250.00, 250.01). 04/29/17  Yes Shade Flood, MD  empagliflozin (JARDIANCE) 10 MG TABS tablet Take 1 tablet (10 mg total) by mouth daily before breakfast. 09/05/22  Yes Shade Flood, MD  gabapentin (NEURONTIN) 300 MG capsule Take 1-2 capsules (300-600 mg total) by mouth 3 (three) times daily as needed. 09/05/22  Yes Shade Flood, MD  glipiZIDE (GLUCOTROL) 5 MG tablet Take 0.5 tablets (2.5 mg total) by mouth 2 (two)  times daily before a meal. 09/05/22  Yes Shade Flood, MD  glucose blood test strip Use as instructed 03/26/22  Yes Shade Flood, MD  Lancets 30G MISC Check sugar twice a day before meals 03/26/22  Yes Shade Flood, MD  metFORMIN (GLUCOPHAGE) 1000 MG tablet Take 1 tablet (1,000 mg total) by mouth 2 (two) times daily with a meal. 05/30/22  Yes Shade Flood, MD   Social History   Socioeconomic History   Marital status: Married    Spouse name: Not on file   Number of children: 2   Years of education: Not on file   Highest education level: Associate degree:  occupational, Scientist, product/process development, or vocational program  Occupational History   Occupation: RT driver  Tobacco Use   Smoking status: Never   Smokeless tobacco: Never  Vaping Use   Vaping status: Never Used  Substance and Sexual Activity   Alcohol use: Yes    Comment: social   Drug use: No   Sexual activity: Yes  Other Topics Concern   Not on file  Social History Narrative   Not on file   Social Determinants of Health   Financial Resource Strain: Low Risk  (12/16/2022)   Overall Financial Resource Strain (CARDIA)    Difficulty of Paying Living Expenses: Not hard at all  Food Insecurity: No Food Insecurity (12/16/2022)   Hunger Vital Sign    Worried About Running Out of Food in the Last Year: Never true    Ran Out of Food in the Last Year: Never true  Transportation Needs: No Transportation Needs (12/16/2022)   PRAPARE - Administrator, Civil Service (Medical): No    Lack of Transportation (Non-Medical): No  Physical Activity: Unknown (12/16/2022)   Exercise Vital Sign    Days of Exercise per Week: Patient declined    Minutes of Exercise per Session: Not on file  Stress: No Stress Concern Present (12/16/2022)   [employer] of Occupational Health - Occupational Stress Questionnaire    Feeling of Stress : Not at all  Social Connections: Moderately Integrated (12/16/2022)   Social Connection and Isolation Panel [NHANES]    Frequency of Communication with Friends and Family: More than three times a week    Frequency of Social Gatherings with Friends and Family: Twice a week    Attends Religious Services: 1 to 4 times per year    Active Member of [employer] or Organizations: No    Attends Engineer, structural: Not on file    Marital Status: Married  Catering manager Violence: Not on file    Review of Systems   Objective:   Vitals:   04/11/23 1553  BP: 118/60  Pulse: 89  Temp: 97.8 F (36.6 C)  TempSrc: Temporal  SpO2: 98%  Weight: 243 lb (110.2 kg)   Height: 6\' 1"  (1.854 m)     Physical Exam Vitals reviewed.  Constitutional:      Appearance: He is well-developed.  HENT:     Head: Normocephalic and atraumatic.  Neck:     Vascular: No carotid bruit or JVD.  Cardiovascular:     Rate and Rhythm: Normal rate and regular rhythm.     Heart sounds: Normal heart sounds. No murmur heard. Pulmonary:     Effort: Pulmonary effort is normal.     Breath sounds: Normal breath sounds. No rales.  Musculoskeletal:     Right lower leg: No edema.     Left lower leg: No edema.  Comments: Left foot, tender to palpation at the distal fifth metatarsal but only minimal discomfort, no overlying skin erythema, soft tissue swelling or apparent bony deformity.  Proximal fifth metatarsal, navicula and remainder of the foot nontender.  Skin:    General: Skin is warm and dry.  Neurological:     Mental Status: He is alert and oriented to person, place, and time.  Psychiatric:        Mood and Affect: Mood normal.     Assessment & Plan:  David Crane is a 57 y.o. male . Type 2 diabetes mellitus with microalbuminuria, without long-term current use of insulin (HCC) - Plan: Comprehensive metabolic panel, Lipid panel, Hemoglobin A1c, empagliflozin (JARDIANCE) 10 MG TABS tablet, metFORMIN (GLUCOPHAGE) 1000 MG tablet, atorvastatin (LIPITOR) 40 MG tablet -Very well-controlled based on home readings and concerns discussed for possible hypoglycemia given borderline lows on sulfonyurea.  Stop glipizide for now.  Continue Jardiance, metformin same doses and check A1c with adjustment of plan accordingly  Hyperlipidemia, unspecified hyperlipidemia type - Plan: Comprehensive metabolic panel, Lipid panel, Hemoglobin A1c, atorvastatin (LIPITOR) 40 MG tablet  -Tolerating Lipitor, continue same.  Check labs, with plan adjustment accordingly.  Left foot pain - Plan: DG Foot Complete Left  -Lateral distal fifth metatarsal.  No known specific injury but does use that  foot with work.  Recommended adjustment of that activity for now, check x-ray, consider podiatry eval depending on x-ray results and symptoms.  Less likely peripheral neuropathy as cause of pain given localized symptoms.    Hyperglycemia - Plan: empagliflozin (JARDIANCE) 10 MG TABS tablet  -Diabetic regimen as above.  Peripheral polyneuropathy - Plan: gabapentin (NEURONTIN) 300 MG capsule  -Continue gabapentin, additional dose in the morning can be tried for foot symptoms but plan as above.  Monitor for any side effects with additional morning dosing, ideally to start on day when he is not at work.  Meds ordered this encounter  Medications   empagliflozin (JARDIANCE) 10 MG TABS tablet    Sig: Take 1 tablet (10 mg total) by mouth daily before breakfast.    Dispense:  90 tablet    Refill:  1   gabapentin (NEURONTIN) 300 MG capsule    Sig: Take 1-2 capsules (300-600 mg total) by mouth 3 (three) times daily as needed.    Dispense:  540 capsule    Refill:  1   metFORMIN (GLUCOPHAGE) 1000 MG tablet    Sig: Take 1 tablet (1,000 mg total) by mouth 2 (two) times daily with a meal.    Dispense:  180 tablet    Refill:  2   atorvastatin (LIPITOR) 40 MG tablet    Sig: Take 1 tablet (40 mg total) by mouth daily.    Dispense:  90 tablet    Refill:  2   Patient Instructions  Stop glipizide at this time with your lower readings as I am concerned about possible low blood sugar. Continue jardiance and metformin same dose for now.   Please have xray of foot and try to avoid repetitive kicking off that foot. Depending on xray, may need to see podiatry.   [facility] or xray: Walk in 8:30-4:30 during weekdays, no appointment needed [address]  [place], [place] [address]      Signed,   Meredith Staggers, MD [facility] 04/11/23 4:43 PM

## 2023-04-11 NOTE — Patient Instructions (Addendum)
Stop glipizide at this time with your lower readings as I am concerned about possible low blood sugar. Continue jardiance and metformin same dose for now.   Please have xray of foot and try to avoid repetitive kicking off that foot. Depending on xray, may need to see podiatry.   [facility] or xray: Walk in 8:30-4:30 during weekdays, no appointment needed [address]  [place], [place] [address]

## 2023-04-12 LAB — LIPID PANEL: Cholesterol: 96 mg/dL (ref 0–200)

## 2023-05-13 ENCOUNTER — Ambulatory Visit: Admission: RE | Admit: 2023-05-13 | Payer: BC Managed Care – PPO | Source: Ambulatory Visit

## 2023-05-13 DIAGNOSIS — M79672 Pain in left foot: Secondary | ICD-10-CM

## 2023-05-13 DIAGNOSIS — M2012 Hallux valgus (acquired), left foot: Secondary | ICD-10-CM | POA: Diagnosis not present

## 2023-05-13 DIAGNOSIS — M19072 Primary osteoarthritis, left ankle and foot: Secondary | ICD-10-CM | POA: Diagnosis not present

## 2023-05-13 DIAGNOSIS — M7732 Calcaneal spur, left foot: Secondary | ICD-10-CM | POA: Diagnosis not present

## 2023-05-13 DIAGNOSIS — M25572 Pain in left ankle and joints of left foot: Secondary | ICD-10-CM | POA: Diagnosis not present

## 2023-05-16 DIAGNOSIS — E113212 Type 2 diabetes mellitus with mild nonproliferative diabetic retinopathy with macular edema, left eye: Secondary | ICD-10-CM | POA: Diagnosis not present

## 2023-05-16 DIAGNOSIS — H35033 Hypertensive retinopathy, bilateral: Secondary | ICD-10-CM | POA: Diagnosis not present

## 2023-05-16 DIAGNOSIS — H43393 Other vitreous opacities, bilateral: Secondary | ICD-10-CM | POA: Diagnosis not present

## 2023-05-16 DIAGNOSIS — E113311 Type 2 diabetes mellitus with moderate nonproliferative diabetic retinopathy with macular edema, right eye: Secondary | ICD-10-CM | POA: Diagnosis not present

## 2023-05-16 DIAGNOSIS — H43821 Vitreomacular adhesion, right eye: Secondary | ICD-10-CM | POA: Diagnosis not present

## 2023-05-16 DIAGNOSIS — H43813 Vitreous degeneration, bilateral: Secondary | ICD-10-CM | POA: Diagnosis not present

## 2023-06-10 ENCOUNTER — Encounter: Payer: Self-pay | Admitting: Family Medicine

## 2023-06-11 ENCOUNTER — Telehealth: Payer: Self-pay

## 2023-06-11 DIAGNOSIS — M79672 Pain in left foot: Secondary | ICD-10-CM

## 2023-06-11 NOTE — Telephone Encounter (Signed)
Pt reports during visit last time foot pain was discussed, notes he is still having the pain and would like to finally see podiatry or other specialist   OK at place order?

## 2023-06-12 NOTE — Telephone Encounter (Signed)
No problem.  I have placed a referral to podiatry.  If any worsening or new symptoms recommend recheck prior to that visit depending on timing of podiatry appointment..  Let me know if there are questions.

## 2023-06-12 NOTE — Telephone Encounter (Signed)
Patient is aware 

## 2023-06-12 NOTE — Addendum Note (Signed)
Addended by: Meredith Staggers R on: 06/12/2023 10:10 AM   Modules accepted: Orders

## 2023-06-24 ENCOUNTER — Encounter: Payer: Self-pay | Admitting: Podiatry

## 2023-06-24 ENCOUNTER — Ambulatory Visit (INDEPENDENT_AMBULATORY_CARE_PROVIDER_SITE_OTHER): Payer: BC Managed Care – PPO

## 2023-06-24 ENCOUNTER — Ambulatory Visit (INDEPENDENT_AMBULATORY_CARE_PROVIDER_SITE_OTHER): Payer: BC Managed Care – PPO | Admitting: Podiatry

## 2023-06-24 DIAGNOSIS — E1142 Type 2 diabetes mellitus with diabetic polyneuropathy: Secondary | ICD-10-CM

## 2023-06-24 DIAGNOSIS — M21622 Bunionette of left foot: Secondary | ICD-10-CM

## 2023-06-24 DIAGNOSIS — B353 Tinea pedis: Secondary | ICD-10-CM

## 2023-06-24 DIAGNOSIS — Q828 Other specified congenital malformations of skin: Secondary | ICD-10-CM

## 2023-06-24 MED ORDER — MELOXICAM 15 MG PO TABS: 15.0000 mg | ORAL_TABLET | Freq: Every day | ORAL | 0 refills | Status: DC

## 2023-06-24 MED ORDER — KETOCONAZOLE 2 % EX CREA: 1.0000 | TOPICAL_CREAM | Freq: Every day | CUTANEOUS | 2 refills | Status: DC

## 2023-06-24 NOTE — Progress Notes (Signed)
Subjective:  Patient ID: David Crane, male    DOB: March 05, 1966,   MRN: [id number]  Chief Complaint  Patient presents with   Bunions    TAILORS BUNION LEFT, CAUSING PAIN, PAIN LEVEL 8/10, IS TAKING OTC PAIN MEDICINE, HAS TRIED DIFFERENT SHOES, TRIED A SPLINT/ SLEEVE, DIABETIC    57 y.o. male presents for concern of left foot pain that has been ongoing for a while but recently worsened. Relates an area on the bottom of his left foot. Relates some burning and numbness in his feet. Concern for a left foot tailors bunion and has been using some padding which helps. 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 Relates burning and tingling in their feet. Patient is diabetic and last A1c was  Lab Results  Component Value Date   HGBA1C 6.2 04/11/2023   .   PCP:  Shade Flood, MD    . Denies any other pedal complaints. Denies n/v/f/c.   Past Medical History:  Diagnosis Date   Diabetes mellitus without complication (HCC)    GERD (gastroesophageal reflux disease)    Hyperlipidemia    Hypertension     Objective:  Physical Exam: Vascular: DP/PT pulses 2/4 bilateral. CFT <3 seconds. Absent hair growth on digits. Edema noted to bilateral lower extremities. Xerosis noted bilaterally.  Skin. No lacerations or abrasions bilateral feet. Nails 1-5 bilateral  are normal in appearance. Hyperkeratotic cored lesion noted sub fifth metatarsal on left.  Musculoskeletal: MMT 5/5 bilateral lower extremities in DF, PF, Inversion and Eversion. Deceased ROM in DF of ankle joint. Tailors bunion deformity noted with mild tenderness to lateral eminence.  Neurological: Sensation intact to light touch. Protective sensation diminished bilateral.    Assessment:   1. Tailor's bunion of left foot      Plan:  Patient was evaluated and treated and all questions answered. -Xrays reviewed Tailors bunion noted with IM 4-5 angle of about 10 with lateral deviation noted.  -Discussed tailors bunions and  treatment options;conservative and surgical management; risks, benefits, alternatives discussed. All patient's questions answered. -Discussed padding and wide shoe gear. -Meloxicam provided.    -Recommend continue with good supportive shoes and inserts.  -Discussed surgical options.  -Discussed and educated patient on diabetic foot care, especially with  regards to the vascular, neurological and musculoskeletal systems.  -Stressed the importance of good glycemic control and the detriment of not  controlling glucose levels in relation to the foot. -Discussed supportive shoes at all times and checking feet regularly. -DM shoes ordered.  -Hyperkeratotic tissue debrided without incident and treated with salyclic acid treatments.   -Answered all patient questions -Patient to return  in 3 months for at risk foot care -Patient advised to call the office if any problems or questions arise in the meantime.    Louann Sjogren, DPM

## 2023-07-11 ENCOUNTER — Other Ambulatory Visit: Payer: BC Managed Care – PPO

## 2023-07-11 DIAGNOSIS — E113212 Type 2 diabetes mellitus with mild nonproliferative diabetic retinopathy with macular edema, left eye: Secondary | ICD-10-CM | POA: Diagnosis not present

## 2023-07-11 DIAGNOSIS — E113311 Type 2 diabetes mellitus with moderate nonproliferative diabetic retinopathy with macular edema, right eye: Secondary | ICD-10-CM | POA: Diagnosis not present

## 2023-07-15 ENCOUNTER — Ambulatory Visit: Payer: BC Managed Care – PPO

## 2023-07-24 ENCOUNTER — Ambulatory Visit: Payer: BC Managed Care – PPO | Admitting: Family Medicine

## 2023-07-24 NOTE — Progress Notes (Signed)
Patient presents to the office today for diabetic shoe and insole measuring.  Patient was measured with brannock device to determine size and width for 1 pair of extra depth shoes and foam casted for 3 pair of insoles.   Documentation of medical necessity will be sent to patient's treating diabetic doctor to verify and sign.   Patient's diabetic provider: Tinnie Gens greens   Shoes and insoles will be ordered at that time and patient will be notified for an appointment for fitting when they arrive.   Shoe size (per patient): 14 Brannock measurement:  Patient shoe selection- 2 - [id number] Shoe size ordered: 14XWD

## 2023-07-29 ENCOUNTER — Ambulatory Visit (INDEPENDENT_AMBULATORY_CARE_PROVIDER_SITE_OTHER): Payer: BC Managed Care – PPO | Admitting: Family Medicine

## 2023-07-29 VITALS — BP 114/68 | HR 86 | Temp 98.1°F | Ht 73.0 in | Wt 236.0 lb

## 2023-07-29 DIAGNOSIS — E1129 Type 2 diabetes mellitus with other diabetic kidney complication: Secondary | ICD-10-CM | POA: Diagnosis not present

## 2023-07-29 DIAGNOSIS — Z7984 Long term (current) use of oral hypoglycemic drugs: Secondary | ICD-10-CM

## 2023-07-29 DIAGNOSIS — R809 Proteinuria, unspecified: Secondary | ICD-10-CM

## 2023-07-29 NOTE — Progress Notes (Unsigned)
Subjective:  Patient ID: David Crane, male    DOB: Jul 10, 1966  Age: 57 y.o. MRN: [id number]  CC:  Chief Complaint  Patient presents with   Follow-up    Chronic med conditions     HPI David Crane presents for   Diabetes: Complicated by peripheral neuropathy, microalbuminuria, retinopathy.  On metformin, Jardiance,.  Off of the gabapentin has been adjusted previously (1 in am, 2 at night, rare midafternoon dose).  Foot pain discussed at last visit in July. Home readings fasting: 114 Postprandial 92,100-110 Symptomatic lows. None. Microalbumin: April of this year. Optho, foot exam, pneumovax: utd.  Saw podiatry - diabetic shoes and inserts ordered. Plan on new shoes for work.    He is on statin with Lipitor 40 mg daily. Metformin 1000 mg twice daily, Jardiance 10 mg daily. Off glipizide since last visit.  Staying active, watching diet. Lost 7#.  Wt Readings from Last 3 Encounters:  07/29/23 236 lb (107 kg)  04/11/23 243 lb (110.2 kg)  02/28/23 240 lb 4 oz (109 kg)    Lab Results  Component Value Date   HGBA1C 6.2 04/11/2023   HGBA1C 6.7 (H) 12/17/2022   HGBA1C 7.0 (H) 09/05/2022   Lab Results  Component Value Date   MICROALBUR 0.8 12/17/2022   LDLCALC 36 04/11/2023   CREATININE 1.27 04/11/2023    HM: Declines flu vaccine, shingrix.    History Patient Active Problem List   Diagnosis Date Noted   Diabetic nephropathy (HCC) 05/01/2017   Diabetes (HCC) 05/01/2017   Peripheral neuropathy 05/01/2017   Past Medical History:  Diagnosis Date   Diabetes mellitus without complication (HCC)    GERD (gastroesophageal reflux disease)    Hyperlipidemia    Hypertension    Past Surgical History:  Procedure Laterality Date   Growth removed Left    TONSILLECTOMY     WISDOM TOOTH EXTRACTION     Allergies  Allergen Reactions   Ace Inhibitors Other (See Comments)    Hypotension, fatigue with attempt at low dose ACE-I and ARB for microalbuminuria.     Angiotensin Receptor Blockers Other (See Comments)    Hypotension, fatigue with attempt at low dose ACE-I and ARB for microalbuminuria   Prior to Admission medications   Medication Sig Start Date End Date Taking? Authorizing Provider  aspirin EC 81 MG tablet Take 1 tablet (81 mg total) by mouth daily. Swallow whole. 06/15/20   O'NealRonnald Ramp, MD  atorvastatin (LIPITOR) 40 MG tablet Take 1 tablet (40 mg total) by mouth daily. 04/11/23   Shade Flood, MD  blood glucose meter kit and supplies Dispense based on patient and insurance preference. Check twice per day. FOR ICD-9 250.00, 250.01). 04/29/17   Shade Flood, MD  empagliflozin (JARDIANCE) 10 MG TABS tablet Take 1 tablet (10 mg total) by mouth daily before breakfast. 04/11/23   Shade Flood, MD  gabapentin (NEURONTIN) 300 MG capsule Take 1-2 capsules (300-600 mg total) by mouth 3 (three) times daily as needed. 04/11/23   Shade Flood, MD  glucose blood test strip Use as instructed 03/26/22   Shade Flood, MD  ketoconazole (NIZORAL) 2 % cream Apply 1 Application topically daily. 06/24/23   Louann Sjogren, DPM  Lancets 30G MISC Check sugar twice a day before meals 03/26/22   Shade Flood, MD  meloxicam (MOBIC) 15 MG tablet Take 1 tablet (15 mg total) by mouth daily. 06/24/23   Louann Sjogren, DPM  metFORMIN (GLUCOPHAGE) 1000 MG tablet  Take 1 tablet (1,000 mg total) by mouth 2 (two) times daily with a meal. 04/11/23   Shade Flood, MD   Social History   Socioeconomic History   Marital status: Married    Spouse name: Not on file   Number of children: 2   Years of education: Not on file   Highest education level: Associate degree: academic program  Occupational History   Occupation: RT driver  Tobacco Use   Smoking status: Never   Smokeless tobacco: Never  Vaping Use   Vaping status: Never Used  Substance and Sexual Activity   Alcohol use: Yes    Comment: social   Drug use: No   Sexual activity: Yes   Other Topics Concern   Not on file  Social History Narrative   Not on file   Social Determinants of Health   Financial Resource Strain: Low Risk  (12/16/2022)   Overall Financial Resource Strain (CARDIA)    Difficulty of Paying Living Expenses: Not hard at all  Food Insecurity: No Food Insecurity (07/28/2023)   Hunger Vital Sign    Worried About Running Out of Food in the Last Year: Never true    Ran Out of Food in the Last Year: Never true  Transportation Needs: No Transportation Needs (07/28/2023)   PRAPARE - Administrator, Civil Service (Medical): No    Lack of Transportation (Non-Medical): No  Physical Activity: Insufficiently Active (07/28/2023)   Exercise Vital Sign    Days of Exercise per Week: 5 days    Minutes of Exercise per Session: 10 min  Stress: No Stress Concern Present (07/28/2023)   [employer] of Occupational Health - Occupational Stress Questionnaire    Feeling of Stress : Not at all  Social Connections: Unknown (07/28/2023)   Social Connection and Isolation Panel [NHANES]    Frequency of Communication with Friends and Family: More than three times a week    Frequency of Social Gatherings with Friends and Family: Three times a week    Attends Religious Services: Patient declined    Active Member of Clubs or Organizations: No    Attends Engineer, structural: Not on file    Marital Status: Married  Catering manager Violence: Not on file    Review of Systems  Constitutional:  Negative for fatigue and unexpected weight change.  Eyes:  Negative for visual disturbance.  Respiratory:  Negative for cough, chest tightness and shortness of breath.   Cardiovascular:  Negative for chest pain, palpitations and leg swelling.  Gastrointestinal:  Negative for abdominal pain and blood in stool.  Neurological:  Negative for dizziness, light-headedness and headaches.     Objective:   Vitals:   07/29/23 1603  BP: 114/68  Pulse: 86  Temp:  98.1 F (36.7 C)  TempSrc: Temporal  SpO2: 98%  Weight: 236 lb (107 kg)  Height: 6\' 1"  (1.854 m)     Physical Exam Vitals reviewed.  Constitutional:      Appearance: He is well-developed.  HENT:     Head: Normocephalic and atraumatic.  Neck:     Vascular: No carotid bruit or JVD.  Cardiovascular:     Rate and Rhythm: Normal rate and regular rhythm.     Heart sounds: Normal heart sounds. No murmur heard. Pulmonary:     Effort: Pulmonary effort is normal.     Breath sounds: Normal breath sounds. No rales.  Musculoskeletal:     Right lower leg: No edema.  Left lower leg: No edema.  Skin:    General: Skin is warm and dry.  Neurological:     Mental Status: He is alert and oriented to person, place, and time.  Psychiatric:        Mood and Affect: Mood normal.        Assessment & Plan:  David Crane is a 57 y.o. male . Type 2 diabetes mellitus with microalbuminuria, without long-term current use of insulin (HCC) - Plan: Comprehensive metabolic panel, Hemoglobin A1c   No orders of the defined types were placed in this encounter.  There are no Patient Instructions on file for this visit.    Signed,   Meredith Staggers, MD [facility], [facility] 07/29/23 4:25 PM

## 2023-07-30 ENCOUNTER — Encounter: Payer: Self-pay | Admitting: Family Medicine

## 2023-07-30 LAB — COMPREHENSIVE METABOLIC PANEL
ALT: 14 U/L (ref 0–53)
AST: 19 U/L (ref 0–37)
Albumin: 4.4 g/dL (ref 3.5–5.2)
Alkaline Phosphatase: 37 U/L — ABNORMAL LOW (ref 39–117)
BUN: 31 mg/dL — ABNORMAL HIGH (ref 6–23)
CO2: 28 meq/L (ref 19–32)
Calcium: 9.1 mg/dL (ref 8.4–10.5)
Chloride: 102 meq/L (ref 96–112)
Creatinine, Ser: 1.35 mg/dL (ref 0.40–1.50)
GFR: 58.29 mL/min — ABNORMAL LOW (ref >60.00)
Glucose, Bld: 137 mg/dL — ABNORMAL HIGH (ref 70–99)
Potassium: 4.6 meq/L (ref 3.5–5.1)
Sodium: 139 meq/L (ref 135–145)
Total Bilirubin: 0.8 mg/dL (ref 0.2–1.2)
Total Protein: 6.9 g/dL (ref 6.0–8.3)

## 2023-07-30 LAB — HEMOGLOBIN A1C: Hgb A1c MFr Bld: 6.9 % — ABNORMAL HIGH (ref 4.6–6.5)

## 2023-08-12 ENCOUNTER — Telehealth: Payer: Self-pay

## 2023-08-12 ENCOUNTER — Ambulatory Visit (INDEPENDENT_AMBULATORY_CARE_PROVIDER_SITE_OTHER): Payer: BC Managed Care – PPO

## 2023-08-12 DIAGNOSIS — M21622 Bunionette of left foot: Secondary | ICD-10-CM

## 2023-08-12 DIAGNOSIS — Q828 Other specified congenital malformations of skin: Secondary | ICD-10-CM

## 2023-08-12 DIAGNOSIS — E1142 Type 2 diabetes mellitus with diabetic polyneuropathy: Secondary | ICD-10-CM | POA: Diagnosis not present

## 2023-08-12 DIAGNOSIS — B353 Tinea pedis: Secondary | ICD-10-CM

## 2023-08-12 NOTE — Progress Notes (Signed)

## 2023-08-12 NOTE — Telephone Encounter (Signed)
-----   Message from Shade Flood sent at 08/10/2023  1:19 PM EST ----- Results sent by MyChart, but appears patient has not yet reviewed those results.  Please call and make sure they have either seen note or discuss result note.  Thanks.

## 2023-08-12 NOTE — Telephone Encounter (Signed)
Blood sugar elevated at 137, other electrolytes overall looked okay.  Kidney function test appears overall stable compared to some of your previous readings.  Make sure to stay hydrated, avoid Advil or Aleve.  Not concerned with the borderline low alkaline phosphatase.  Although A1c has slightly increased, still okay at 6.9.  No changes for now.  Let me know if there are questions.   Dr. Neva Seat

## 2023-08-13 NOTE — Telephone Encounter (Signed)
Pt has been informed and notes MyChart was okay

## 2023-09-05 DIAGNOSIS — E113212 Type 2 diabetes mellitus with mild nonproliferative diabetic retinopathy with macular edema, left eye: Secondary | ICD-10-CM | POA: Diagnosis not present

## 2023-09-05 DIAGNOSIS — E113311 Type 2 diabetes mellitus with moderate nonproliferative diabetic retinopathy with macular edema, right eye: Secondary | ICD-10-CM | POA: Diagnosis not present

## 2023-11-03 ENCOUNTER — Other Ambulatory Visit: Payer: Self-pay | Admitting: Family Medicine

## 2023-11-03 DIAGNOSIS — G629 Polyneuropathy, unspecified: Secondary | ICD-10-CM

## 2023-11-04 IMAGING — CR DG CHEST 2V
3 series · 3 of 3 positions shown · non-contrast
Comparison: 05/21/2017

CLINICAL DATA: Recent lower respiratory infection

EXAM:
CHEST - 2 VIEW

[w chest pa (1 of 2)]
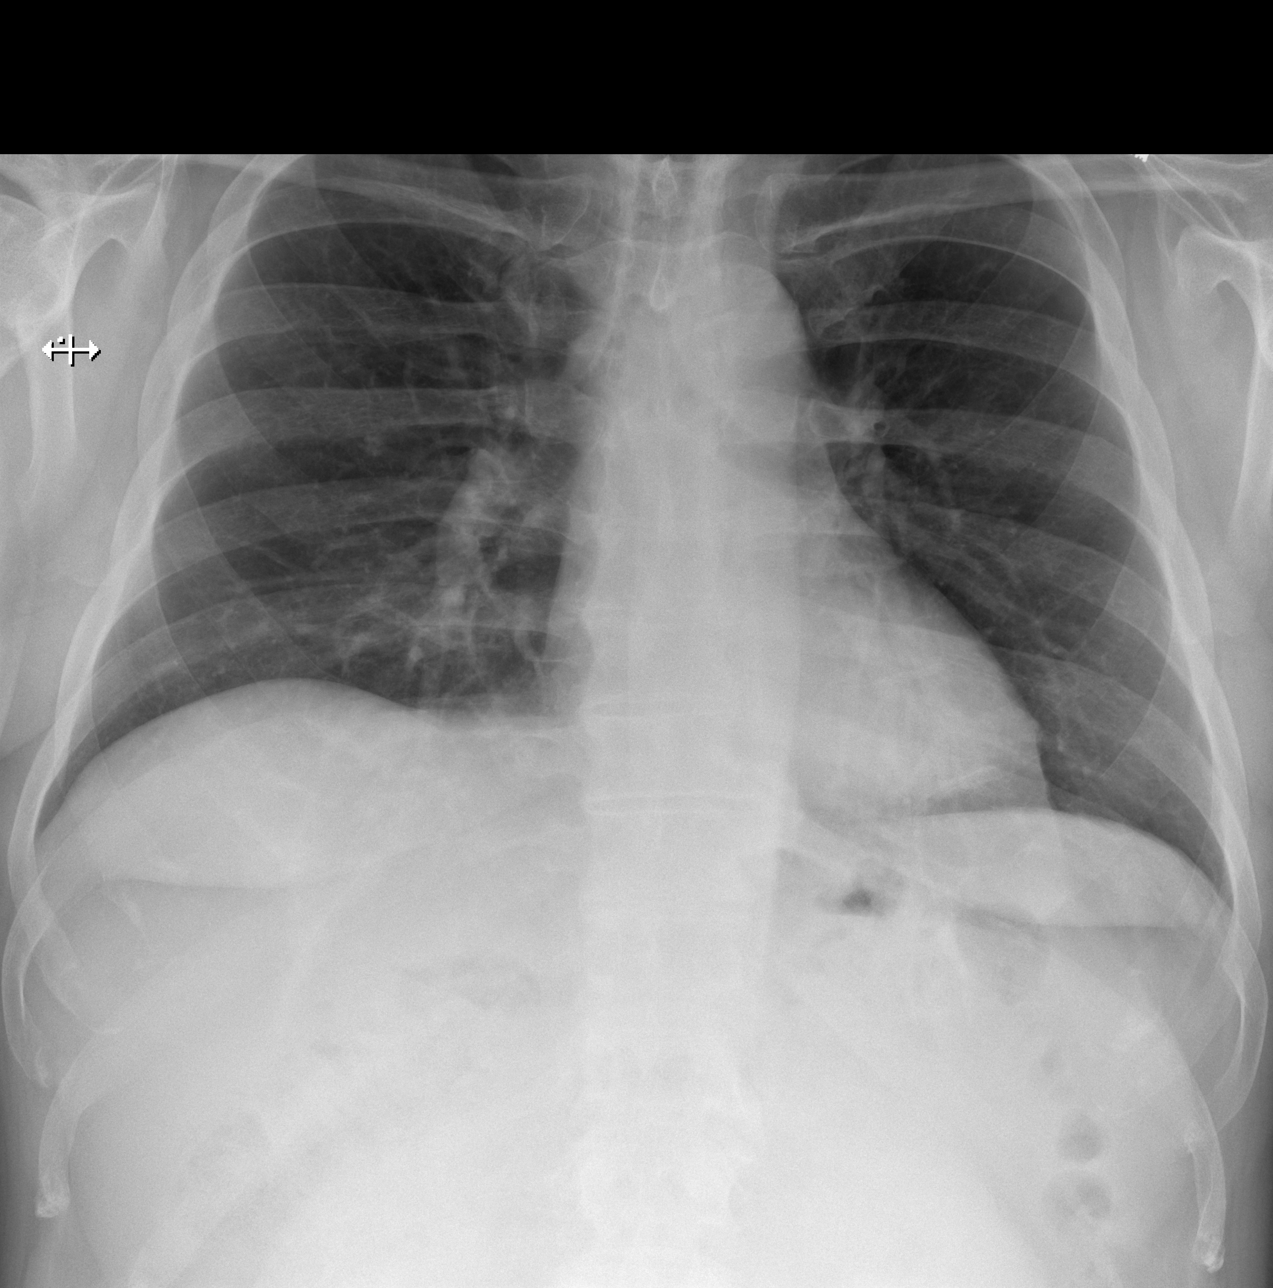

[w chest pa (2 of 2)]
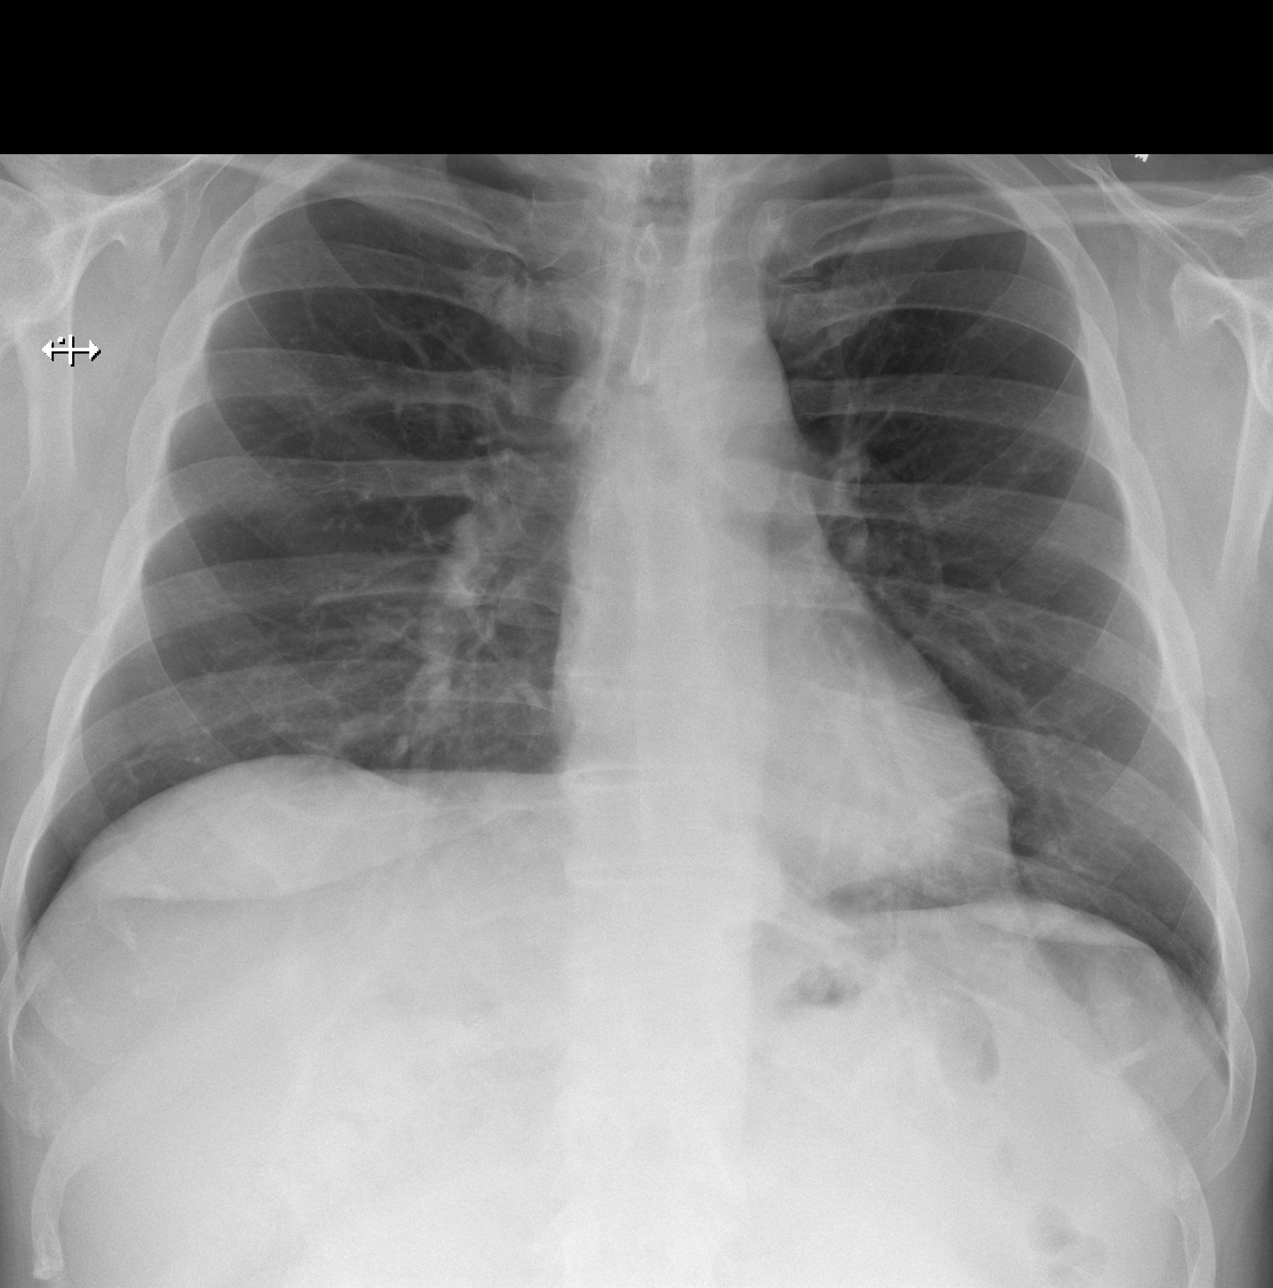

[w chest lat]
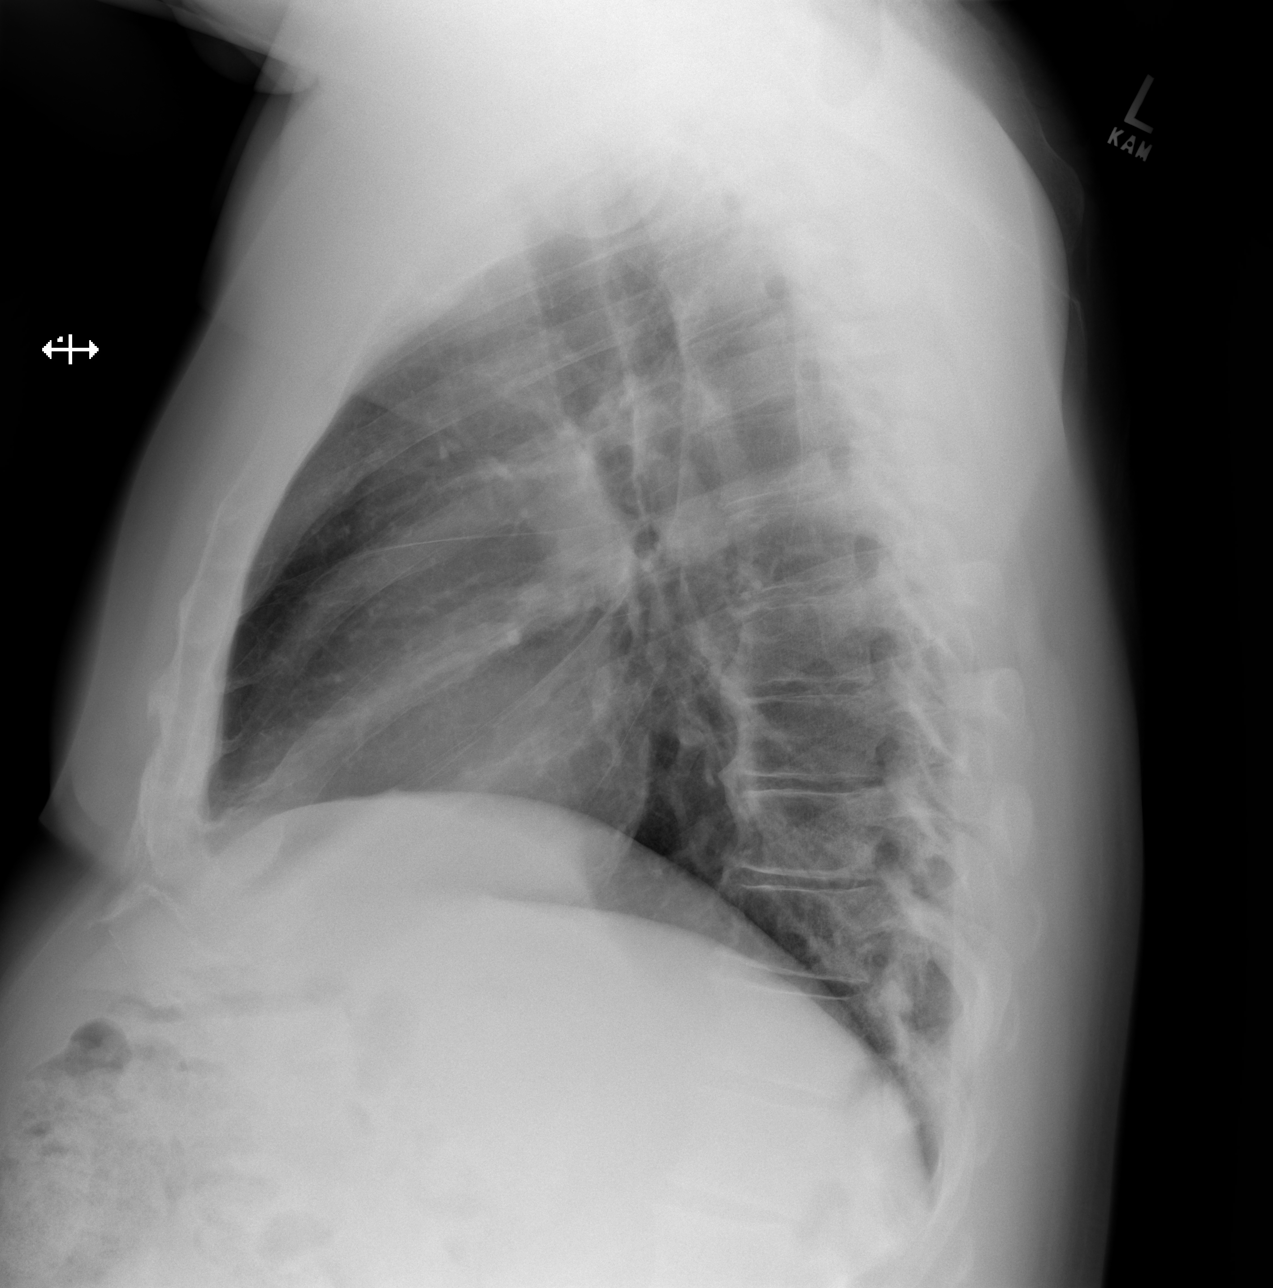

[3 of 3 positions shown; findings below may reference images not displayed]

FINDINGS: The heart size and mediastinal contours are within normal limits.
Both lungs are clear. The visualized skeletal structures are
unremarkable.
IMPRESSION: No active cardiopulmonary disease.

## 2023-11-11 DIAGNOSIS — E113311 Type 2 diabetes mellitus with moderate nonproliferative diabetic retinopathy with macular edema, right eye: Secondary | ICD-10-CM | POA: Diagnosis not present

## 2023-11-11 DIAGNOSIS — E113212 Type 2 diabetes mellitus with mild nonproliferative diabetic retinopathy with macular edema, left eye: Secondary | ICD-10-CM | POA: Diagnosis not present

## 2023-11-15 ENCOUNTER — Other Ambulatory Visit: Payer: Self-pay | Admitting: Family Medicine

## 2023-11-15 DIAGNOSIS — E1129 Type 2 diabetes mellitus with other diabetic kidney complication: Secondary | ICD-10-CM

## 2023-11-15 DIAGNOSIS — R739 Hyperglycemia, unspecified: Secondary | ICD-10-CM

## 2023-11-18 ENCOUNTER — Ambulatory Visit: Payer: Self-pay | Admitting: Family Medicine

## 2023-11-18 DIAGNOSIS — M545 Low back pain, unspecified: Secondary | ICD-10-CM

## 2023-11-18 MED ORDER — METHOCARBAMOL 500 MG PO TABS
500.0000 mg | ORAL_TABLET | Freq: Three times a day (TID) | ORAL | 0 refills | Status: DC | PRN
Start: 2023-11-18 — End: 2024-02-17

## 2023-11-18 NOTE — Telephone Encounter (Signed)
 He has experienced a few flares of back pain previously.  Note reviewed from CRM.  Recent flare after twisting while picking up wood from the wood pile.  Mild to moderate pain, no radicular pain, no neurologic symptoms.  I am okay refilling muscle relaxant temporarily but if pain is not improving this week needs office visit.  Additionally if any new or worsening symptoms, needs evaluation through office visit, urgent care or ER if needed.  Please advise him of this plan and let me know if there are questions.

## 2023-11-18 NOTE — Telephone Encounter (Signed)
 Patient has been contacted and made aware will call back if needs an appt

## 2023-11-18 NOTE — Telephone Encounter (Signed)
 Copied From CRM [id number]. Reason for Triage: Patient is requesting a med refill for some back pain meds. NAME OF MEDICATION: methocarbamol (ROBAXIN) 500 MG tablet Dr. Neva Seat prescribed for back spasms    Chief Complaint: low back pain; medication refill Symptoms: intermittent back spasm Frequency: intermittents Pertinent Negatives: Patient denies weakness Disposition: [] ED /[] Urgent Care (no appt availability in office) / [] Appointment(In office/virtual)/ []  [facility]/ [] Home Care/ [] Refused Recommended Disposition /[] [facility]/ [x]  Follow-up with PCP Additional Notes: Pt endorsing twisting incorrectly yesterday while lifting wood. States overall he feels fine but he has back spasms occasionally since then. States that he was prescribed Robaxin in [id number] and has just a few tabs left, was requesting a refill to help w ith the back spasms. Will route to clinic for f/u  Reason for Disposition  Caused by overuse from recent vigorous activity (e.g., exercise, gardening, lifting and carrying, sports)  Answer Assessment - Initial Assessment Questions 1. ONSET: "When did the pain begin?"      Yesterday   2. LOCATION: "Where does it hurt?" (upper, mid or lower back)     Lower back, middle to left side  3. SEVERITY: "How bad is the pain?"  (e.g., Scale 1-10; mild, moderate, or severe)   - MILD (1-3): Doesn't interfere with normal activities.    - MODERATE (4-7): Interferes with normal activities or awakens from sleep.    - SEVERE (8-10): Excruciating pain, unable to do any normal activities.      Mild to moderate  4. PATTERN: "Is the pain constant?" (e.g., yes, no; constant, intermittent)      Comes and goes  5. RADIATION: "Does the pain shoot into your legs or somewhere else?"     No  6. CAUSE:  "What do you think is causing the back pain?"      Was getting wood out of woodpile and twisted wrong  7. BACK OVERUSE:  "Any recent lifting of heavy objects, strenuous  work or exercise?"     Heavy lifting  8. MEDICINES: "What have you taken so far for the pain?" (e.g., nothing, acetaminophen, NSAIDS)     Robaxin  9. NEUROLOGIC SYMPTOMS: "Do you have any weakness, numbness, or problems with bowel/bladder control?"     No  10. OTHER SYMPTOMS: "Do you have any other symptoms?" (e.g., fever, abdomen pain, burning with urination, blood in urine)       No  Protocols used: Back Pain-A-AH

## 2023-11-18 NOTE — Telephone Encounter (Signed)
 Patient is having back pain and was prescribed Robaxin 500mg  in May of 2024. Patient states he only has enough pills left over from last prescription for 2-3 days then he will be completley out. Did call patient to try and schedule an appointment and he can only do later appointments or he can do a later virtual appointment. Patient was not impressed with the times Dr.Green and Dr. Beverely Low had available this week Patient wants to be seen ASAP.   Patient is wanting Robaxin 500mg  prescribed again.   Please advise with next step you see acceptable.

## 2023-12-11 ENCOUNTER — Ambulatory Visit (INDEPENDENT_AMBULATORY_CARE_PROVIDER_SITE_OTHER): Admitting: Family Medicine

## 2023-12-11 VITALS — BP 102/60 | HR 94 | Temp 98.4°F | Ht 73.0 in | Wt 229.0 lb

## 2023-12-11 DIAGNOSIS — R809 Proteinuria, unspecified: Secondary | ICD-10-CM

## 2023-12-11 DIAGNOSIS — Z87898 Personal history of other specified conditions: Secondary | ICD-10-CM

## 2023-12-11 DIAGNOSIS — R55 Syncope and collapse: Secondary | ICD-10-CM | POA: Diagnosis not present

## 2023-12-11 DIAGNOSIS — R42 Dizziness and giddiness: Secondary | ICD-10-CM

## 2023-12-11 DIAGNOSIS — E1129 Type 2 diabetes mellitus with other diabetic kidney complication: Secondary | ICD-10-CM

## 2023-12-11 LAB — GLUCOSE, POCT (MANUAL RESULT ENTRY): POC Glucose: 124 mg/dL — AB (ref 70–99)

## 2023-12-11 MED ORDER — MECLIZINE HCL 25 MG PO TABS
25.0000 mg | ORAL_TABLET | Freq: Three times a day (TID) | ORAL | 0 refills | Status: DC | PRN
Start: 2023-12-11 — End: 2024-02-17

## 2023-12-11 NOTE — Progress Notes (Signed)
 Subjective:  Patient ID: David Crane, male    DOB: March 04, 1966  Age: 58 y.o. MRN: [id number]  CC:  Chief Complaint  Patient presents with   Dizziness    Pt notes low energy, dizzy spinning feeling Rt ear pain started monday, hxt of vertigo in 2016, had a fall Monday due to dizziness  Patient requesting work note for Tuesday -Thursday pended in wrap up     HPI EARLIE SCHANK presents for   Dizziness Noted 2 days ago. Slight cough with allergies few days prior, with allergies. No CP/fever/dyspnea.  Got up 2 days ago - went to work - felt nausea, slight spinning feeling, but mild symptoms. No new HA, focal weakness, speech difficulty or facial droop.  Went home form work at noon.  No vomiting. Eating and drinking ok. Normal BM, no dark stools or BRBPR.  Dizzy with leaning forward last night. Had been feeling better yesterday prior. Slight upset stomach this am - better now. Still slight dizziness today. Able to drive, normal fucntions, just slightly off - has not gone to work.  Tx: mucinex, claritin. No relief.   R ear sore this morning, intermittent. No d/c. Better now.   Hx of vertigo in 2016  - feels similar.   Hx of diabetes, has not checked blood sugar. Glucose 111 day prior to symptoms. Did not feel like prior low blood sugars - not shaky.   2 nights ago stood up, went to urinate, fell to floor, unknown if LOC, no initial injury. Stood up, collapsed again. Awake when hit floor - LOC on way to floor. Had taken gabapentin that night.  Hit left upper chest on toilet, and scraped left elbow. Chest not sore today. No current pain.  Able to move elbow now, no pain in elbow now. No head injury or other injuries.  No preceding heart palpitations or chest pain.  No syncope or near syncope yesterday or today.     Orthostatic VS for the past 24 hrs (Last 3 readings):  BP- Lying Pulse- Lying BP- Sitting Pulse- Sitting BP- Standing at 0 minutes Pulse- Standing at 0 minutes BP-  Standing at 3 minutes Pulse- Standing at 3 minutes  12/11/23 1156 110/64 86 112/66 99 100/60 106 108/74 88       History Patient Active Problem List   Diagnosis Date Noted   Diabetic nephropathy (HCC) 05/01/2017   Diabetes (HCC) 05/01/2017   Peripheral neuropathy 05/01/2017   Past Medical History:  Diagnosis Date   Diabetes mellitus without complication (HCC)    GERD (gastroesophageal reflux disease)    Hyperlipidemia    Hypertension    Past Surgical History:  Procedure Laterality Date   Growth removed Left    TONSILLECTOMY     WISDOM TOOTH EXTRACTION     Allergies  Allergen Reactions   Ace Inhibitors Other (See Comments)    Hypotension, fatigue with attempt at low dose ACE-I and ARB for microalbuminuria.    Angiotensin Receptor Blockers Other (See Comments)    Hypotension, fatigue with attempt at low dose ACE-I and ARB for microalbuminuria   Prior to Admission medications   Medication Sig Start Date End Date Taking? Authorizing Provider  aspirin EC 81 MG tablet Take 1 tablet (81 mg total) by mouth daily. Swallow whole. 06/15/20  Yes O'Neal, [name], MD  atorvastatin (LIPITOR) 40 MG tablet Take 1 tablet (40 mg total) by mouth daily. 04/11/23  Yes Shade Flood, MD  blood glucose meter kit and  supplies Dispense based on patient and insurance preference. Check twice per day. FOR ICD-9 250.00, 250.01). 04/29/17  Yes Shade Flood, MD  gabapentin (NEURONTIN) 300 MG capsule TAKE 1 TO 2 CAPSULES BY MOUTH THREE TIMES DAILY AS NEEDED 11/04/23  Yes Shade Flood, MD  glucose blood test strip Use as instructed 03/26/22  Yes Shade Flood, MD  JARDIANCE 10 MG TABS tablet TAKE 1 TABLET BY MOUTH ONCE DAILY BEFORE BREAKFAST 11/15/23  Yes Shade Flood, MD  Lancets 30G MISC Check sugar twice a day before meals 03/26/22  Yes Shade Flood, MD  metFORMIN (GLUCOPHAGE) 1000 MG tablet Take 1 tablet (1,000 mg total) by mouth 2 (two) times daily with a meal. 04/11/23  Yes  Shade Flood, MD  methocarbamol (ROBAXIN) 500 MG tablet Take 1 tablet (500 mg total) by mouth every 8 (eight) hours as needed for muscle spasms. 11/18/23  Yes Shade Flood, MD   Social History   Socioeconomic History   Marital status: Married    Spouse name: Not on file   Number of children: 2   Years of education: Not on file   Highest education level: Associate degree: academic program  Occupational History   Occupation: RT driver  Tobacco Use   Smoking status: Never   Smokeless tobacco: Never  Vaping Use   Vaping status: Never Used  Substance and Sexual Activity   Alcohol use: Yes    Comment: social   Drug use: No   Sexual activity: Yes  Other Topics Concern   Not on file  Social History Narrative   Not on file   Social Drivers of Health   Financial Resource Strain: Low Risk  (12/11/2023)   Overall Financial Resource Strain (CARDIA)    Difficulty of Paying Living Expenses: Not hard at all  Food Insecurity: No Food Insecurity (12/11/2023)   Hunger Vital Sign    Worried About Running Out of Food in the Last Year: Never true    Ran Out of Food in the Last Year: Never true  Transportation Needs: No Transportation Needs (12/11/2023)   PRAPARE - Administrator, Civil Service (Medical): No    Lack of Transportation (Non-Medical): No  Physical Activity: Sufficiently Active (12/11/2023)   Exercise Vital Sign    Days of Exercise per Week: 5 days    Minutes of Exercise per Session: 60 min  Stress: No Stress Concern Present (12/11/2023)   [employer] of Occupational Health - Occupational Stress Questionnaire    Feeling of Stress : Not at all  Social Connections: Unknown (12/11/2023)   Social Connection and Isolation Panel [NHANES]    Frequency of Communication with Friends and Family: More than three times a week    Frequency of Social Gatherings with Friends and Family: Twice a week    Attends Religious Services: Patient declined    Doctor, general practice or Organizations: No    Attends Engineer, structural: Not on file    Marital Status: Married  Catering manager Violence: Not on file    Review of Systems   Objective:   Vitals:   12/11/23 1136  BP: 102/60  Pulse: 94  Temp: 98.4 F (36.9 C)  TempSrc: Temporal  SpO2: 98%  Weight: 229 lb (103.9 kg)  Height: 6\' 1"  (1.854 m)     Physical Exam Vitals reviewed.  Constitutional:      Appearance: He is well-developed.  HENT:     Head: Normocephalic and  atraumatic.     Right Ear: Tympanic membrane, ear canal and external ear normal.     Left Ear: Tympanic membrane, ear canal and external ear normal.     Nose: No rhinorrhea.     Mouth/Throat:     Pharynx: No oropharyngeal exudate or posterior oropharyngeal erythema.  Eyes:     Conjunctiva/sclera: Conjunctivae normal.     Comments: Slightly miotic left vs right- anisocoria, he notes this has been present prior. Reactive. No nystagmus.   Cardiovascular:     Rate and Rhythm: Normal rate and regular rhythm.     Heart sounds: Normal heart sounds. No murmur heard. Pulmonary:     Effort: Pulmonary effort is normal.     Breath sounds: Normal breath sounds. No wheezing, rhonchi or rales.     Comments: Chest wall nontender, no ecchymosis or wounds. Abdominal:     Palpations: Abdomen is soft.     Tenderness: There is no abdominal tenderness.  Musculoskeletal:     Cervical back: Neck supple.     Comments: Left elbow small superficial healing abrasion over lateral epicondyle.  No surrounding erythema.  No bony tenderness.  Lymphadenopathy:     Cervical: No cervical adenopathy.  Skin:    General: Skin is warm and dry.     Findings: No rash.  Neurological:     Mental Status: He is alert and oriented to person, place, and time.     GCS: GCS eye subscore is 4. GCS verbal subscore is 5. GCS motor subscore is 6.     Cranial Nerves: No cranial nerve deficit, dysarthria or facial asymmetry.     Motor: No pronator drift.      Coordination: Coordination is intact.  Psychiatric:        Behavior: Behavior normal.    Results for orders placed or performed in visit on 12/11/23  POCT glucose (manual entry)   Collection Time: 12/11/23 12:40 PM  Result Value Ref Range   POC Glucose 124 (A) 70 - 99 mg/dl   EKG sinus rhythm, rate 84.Compared to 05/31/2020, no apparent significant changes or acute ST/T wave changes.  Assessment & Plan:  SAXTON CHAIN is a 58 y.o. male . Dizziness - Plan: CBC, Basic metabolic panel, EKG 12-Lead, POCT glucose (manual entry), meclizine (ANTIVERT) 25 MG tablet Syncope, unspecified syncope type - Plan: CBC, Basic metabolic panel, EKG 12-Lead Type 2 diabetes mellitus with microalbuminuria, without long-term current use of insulin (HCC) - Plan: POCT glucose (manual entry) History of vertigo - Plan: meclizine (ANTIVERT) 25 MG tablet  -Vertiginous symptoms with room spinning sensation.  Post micturition syncope without any persistent pain from chest wall contusion, elbow abrasion or other injuries.  denies any preceding cardiac symptoms or any focal weakness.  Nonfocal neurologic exam, reassuring EKG.  Overall reassuring orthostatics.  No nystagmus noted on exam but symptoms have improved.  With minimal symptoms, prior vertigo, possible referral vertigo.  Unlikely central process, unlikely CVA or cardiac process.   -Check CBC, BMP, home monitoring of blood sugars if any recurrent dizziness to rule out hypoglycemia although unlikely.  Update on symptoms in the next 24 to 48 hours.  If not continuing to improve consider imaging.  ER precautions if any new or worsening symptoms.  Work note provided, return on Friday if symptoms have resolved.   Meds ordered this encounter  Medications   meclizine (ANTIVERT) 25 MG tablet    Sig: Take 1 tablet (25 mg total) by mouth 3 (three) times daily as  needed for dizziness.    Dispense:  30 tablet    Refill:  0   Patient Instructions  Glad to hear you are  improving.  Check blood sugars and monitor for any low blood sugars, especially if any return or worsening dizziness.  Based on spinning sensation, previous history of vertigo, you could have a recurrence of vertigo, or may have some middle ear congestion from recent allergies.  I did prescribe meclizine if needed.  Be cautious with use of gabapentin for now  - lowest effective dose.As long as your dizziness is continuing to improve, no other medication changes for now.  I am glad to hear there is no residual pain from the fall, but if any new areas of pain or return of falling, be seen right away.   Give me an update on your symptoms in the next 24-48 hours if you are not continuing to improve I would consider neuroimaging or other testing.  Be seen right away if any new or worsening symptoms.  Take care.   Dizziness Dizziness is a common problem. It makes you feel unsteady or light-headed. You may feel like you're about to faint. Dizziness can lead to getting hurt if you stumble or fall. It's more common to feel dizzy if you're an older adult. Many things can cause you to feel dizzy. These include: Medicines. Dehydration. This is when there's not enough water in your body. Illness. Follow these instructions at home: Eating and drinking  Drink enough fluid to keep your pee (urine) pale yellow. This helps keep you from getting dehydrated. Try to drink more clear fluids, such as water. Do not drink alcohol. Try to limit how much caffeine you take in. Try to limit how much salt, also called sodium, you take in. Activity Try not to make quick movements. Stand up slowly from sitting in a chair. Steady yourself until you feel okay. In the morning, first sit up on the side of the bed. When you feel okay, hold onto something and slowly stand up. Do this until you know that your balance is okay. If you need to stand in one place for a long time, move your legs often. Tighten and relax the muscles in  your legs while you're standing. Do not drive or use machines if you feel dizzy. Avoid bending down if you feel dizzy. Place items in your home so you can reach them without leaning over. Lifestyle Do not smoke, vape, or use products with nicotine or tobacco in them. If you need help quitting, talk with your health care provider. Try to lower your stress level. You can do this by using methods like yoga or meditation. Talk with your provider if you need help. General instructions Watch your dizziness for any changes. Take your medicines only as told by your provider. Talk with your provider if you think you're dizzy because of a medicine you're taking. Tell a friend or a family member that you're feeling dizzy. If they spot any changes in your behavior, have them call your provider. Contact a health care provider if: Your dizziness doesn't go away, or you have new symptoms. Your dizziness gets worse. You feel like you may vomit. You have trouble hearing. You have a fever. You have neck pain or a stiff neck. You fall or get hurt. Get help right away if: You vomit each time you eat or drink. You have watery poop and can't eat or drink. You have trouble talking, walking, swallowing, or  using your arms, hands, or legs. You feel very weak. You're bleeding. You're not thinking clearly, or you have trouble forming sentences. A friend or family member may spot this. Your vision changes, or you get a very bad headache. These symptoms may be an emergency. Call 911 right away. Do not wait to see if the symptoms will go away. Do not drive yourself to the hospital. This information is not intended to replace advice given to you by your health care provider. Make sure you discuss any questions you have with your health care provider. Document Revised: 06/06/2023 Document Reviewed: 10/18/2022 Elsevier Patient Education  2024 [employer] Inc.      Signed,   Meredith Staggers, MD [facility], [facility] 12/11/23 12:27 PM

## 2023-12-11 NOTE — Patient Instructions (Addendum)
 Glad to hear you are improving.  Check blood sugars and monitor for any low blood sugars, especially if any return or worsening dizziness.  Based on spinning sensation, previous history of vertigo, you could have a recurrence of vertigo, or may have some middle ear congestion from recent allergies.  I did prescribe meclizine if needed.  Be cautious with use of gabapentin for now  - lowest effective dose.As long as your dizziness is continuing to improve, no other medication changes for now.  I am glad to hear there is no residual pain from the fall, but if any new areas of pain or return of falling, be seen right away.   Give me an update on your symptoms in the next 24-48 hours if you are not continuing to improve I would consider neuroimaging or other testing.  Be seen right away if any new or worsening symptoms.  Take care.   Dizziness Dizziness is a common problem. It makes you feel unsteady or light-headed. You may feel like you're about to faint. Dizziness can lead to getting hurt if you stumble or fall. It's more common to feel dizzy if you're an older adult. Many things can cause you to feel dizzy. These include: Medicines. Dehydration. This is when there's not enough water in your body. Illness. Follow these instructions at home: Eating and drinking  Drink enough fluid to keep your pee (urine) pale yellow. This helps keep you from getting dehydrated. Try to drink more clear fluids, such as water. Do not drink alcohol. Try to limit how much caffeine you take in. Try to limit how much salt, also called sodium, you take in. Activity Try not to make quick movements. Stand up slowly from sitting in a chair. Steady yourself until you feel okay. In the morning, first sit up on the side of the bed. When you feel okay, hold onto something and slowly stand up. Do this until you know that your balance is okay. If you need to stand in one place for a long time, move your legs often. Tighten and  relax the muscles in your legs while you're standing. Do not drive or use machines if you feel dizzy. Avoid bending down if you feel dizzy. Place items in your home so you can reach them without leaning over. Lifestyle Do not smoke, vape, or use products with nicotine or tobacco in them. If you need help quitting, talk with your health care provider. Try to lower your stress level. You can do this by using methods like yoga or meditation. Talk with your provider if you need help. General instructions Watch your dizziness for any changes. Take your medicines only as told by your provider. Talk with your provider if you think you're dizzy because of a medicine you're taking. Tell a friend or a family member that you're feeling dizzy. If they spot any changes in your behavior, have them call your provider. Contact a health care provider if: Your dizziness doesn't go away, or you have new symptoms. Your dizziness gets worse. You feel like you may vomit. You have trouble hearing. You have a fever. You have neck pain or a stiff neck. You fall or get hurt. Get help right away if: You vomit each time you eat or drink. You have watery poop and can't eat or drink. You have trouble talking, walking, swallowing, or using your arms, hands, or legs. You feel very weak. You're bleeding. You're not thinking clearly, or you have trouble forming sentences.  A friend or family member may spot this. Your vision changes, or you get a very bad headache. These symptoms may be an emergency. Call 911 right away. Do not wait to see if the symptoms will go away. Do not drive yourself to the hospital. This information is not intended to replace advice given to you by your health care provider. Make sure you discuss any questions you have with your health care provider. Document Revised: 06/06/2023 Document Reviewed: 10/18/2022 Elsevier Patient Education  2024 [employer].

## 2023-12-12 LAB — BASIC METABOLIC PANEL WITH GFR
BUN: 23 mg/dL (ref 6–23)
CO2: 28 meq/L (ref 19–32)
Calcium: 9.7 mg/dL (ref 8.4–10.5)
Chloride: 98 meq/L (ref 96–112)
Creatinine, Ser: 1 mg/dL (ref 0.40–1.50)
GFR: 83.34 mL/min (ref >60.00)
Glucose, Bld: 106 mg/dL — ABNORMAL HIGH (ref 70–99)
Potassium: 4.5 meq/L (ref 3.5–5.1)
Sodium: 137 meq/L (ref 135–145)

## 2023-12-12 LAB — CBC
HCT: 46.9 % (ref 39.0–52.0)
Hemoglobin: 15.5 g/dL (ref 13.0–17.0)
MCHC: 33 g/dL (ref 30.0–36.0)
MCV: 90.6 fl (ref 78.0–100.0)
Platelets: 202 10*3/uL (ref 150.0–400.0)
RBC: 5.17 Mil/uL (ref 4.22–5.81)
RDW: 14.1 % (ref 11.5–15.5)
WBC: 5.2 10*3/uL (ref 4.0–10.5)

## 2023-12-13 ENCOUNTER — Encounter: Payer: Self-pay | Admitting: Family Medicine

## 2023-12-15 ENCOUNTER — Encounter: Payer: Self-pay | Admitting: Family Medicine

## 2024-01-09 DIAGNOSIS — E113212 Type 2 diabetes mellitus with mild nonproliferative diabetic retinopathy with macular edema, left eye: Secondary | ICD-10-CM | POA: Diagnosis not present

## 2024-01-09 DIAGNOSIS — E113311 Type 2 diabetes mellitus with moderate nonproliferative diabetic retinopathy with macular edema, right eye: Secondary | ICD-10-CM | POA: Diagnosis not present

## 2024-02-08 ENCOUNTER — Other Ambulatory Visit: Payer: Self-pay | Admitting: Family Medicine

## 2024-02-08 DIAGNOSIS — E1129 Type 2 diabetes mellitus with other diabetic kidney complication: Secondary | ICD-10-CM

## 2024-02-08 DIAGNOSIS — R739 Hyperglycemia, unspecified: Secondary | ICD-10-CM

## 2024-02-13 ENCOUNTER — Other Ambulatory Visit: Payer: Self-pay | Admitting: Family Medicine

## 2024-02-13 ENCOUNTER — Other Ambulatory Visit: Payer: Self-pay

## 2024-02-13 DIAGNOSIS — E785 Hyperlipidemia, unspecified: Secondary | ICD-10-CM

## 2024-02-13 DIAGNOSIS — E1129 Type 2 diabetes mellitus with other diabetic kidney complication: Secondary | ICD-10-CM

## 2024-02-13 MED ORDER — ATORVASTATIN CALCIUM 40 MG PO TABS: 40.0000 mg | ORAL_TABLET | Freq: Every day | ORAL | 2 refills | Status: DC

## 2024-02-14 ENCOUNTER — Other Ambulatory Visit: Payer: Self-pay | Admitting: Family Medicine

## 2024-02-14 DIAGNOSIS — E1129 Type 2 diabetes mellitus with other diabetic kidney complication: Secondary | ICD-10-CM

## 2024-02-14 DIAGNOSIS — E785 Hyperlipidemia, unspecified: Secondary | ICD-10-CM

## 2024-02-14 NOTE — Progress Notes (Signed)
 Future labs ordered.

## 2024-02-15 ENCOUNTER — Other Ambulatory Visit: Payer: Self-pay | Admitting: Family Medicine

## 2024-02-15 DIAGNOSIS — G629 Polyneuropathy, unspecified: Secondary | ICD-10-CM

## 2024-02-17 ENCOUNTER — Encounter: Payer: Self-pay | Admitting: Family Medicine

## 2024-02-17 ENCOUNTER — Ambulatory Visit (INDEPENDENT_AMBULATORY_CARE_PROVIDER_SITE_OTHER): Payer: BC Managed Care – PPO | Admitting: Family Medicine

## 2024-02-17 VITALS — BP 100/66 | HR 81 | Temp 98.4°F | Ht 74.5 in | Wt 240.2 lb

## 2024-02-17 DIAGNOSIS — E785 Hyperlipidemia, unspecified: Secondary | ICD-10-CM

## 2024-02-17 DIAGNOSIS — M79604 Pain in right leg: Secondary | ICD-10-CM

## 2024-02-17 DIAGNOSIS — Z7984 Long term (current) use of oral hypoglycemic drugs: Secondary | ICD-10-CM

## 2024-02-17 DIAGNOSIS — I83891 Varicose veins of right lower extremities with other complications: Secondary | ICD-10-CM | POA: Diagnosis not present

## 2024-02-17 DIAGNOSIS — R809 Proteinuria, unspecified: Secondary | ICD-10-CM

## 2024-02-17 DIAGNOSIS — E1129 Type 2 diabetes mellitus with other diabetic kidney complication: Secondary | ICD-10-CM | POA: Diagnosis not present

## 2024-02-17 DIAGNOSIS — R739 Hyperglycemia, unspecified: Secondary | ICD-10-CM

## 2024-02-17 MED ORDER — EMPAGLIFLOZIN 10 MG PO TABS: 10.0000 mg | ORAL_TABLET | Freq: Every day | ORAL | 1 refills | Status: DC

## 2024-02-17 MED ORDER — METFORMIN HCL 1000 MG PO TABS: 1000.0000 mg | ORAL_TABLET | Freq: Two times a day (BID) | ORAL | 2 refills | Status: DC

## 2024-02-17 NOTE — Progress Notes (Signed)
 Subjective:  Patient ID: David Crane, male    DOB: July 16, 1966  Age: 58 y.o. MRN: [id number]  CC:  Chief Complaint  Patient presents with   Follow-up    Patient wants to talk about seeing a vein Dr. Donnita Gales vein on back of leg is kind of bothering here. Nothing else to discuss.     HPI David Crane presents for chronic condition follow-up and other concerns as above  Pain of vein on leg - R Past month.  Pain in vein on back of R calf.  Hx of varicose veins. No prior treatment.  Cruise 2 weeks ago. [place]  cruise.  Sore on outside. Intermittent, not constant.  No chest pains, no dyspnea. No hx of DVT.   Diabetes: Complicated by microalbuminuria, peripheral neuropathy.  Intolerant to ACE inhibitor., Prior vertigo cleared after a week.   gabapentin  - 1 in am, 2 at night, rare 1 in afternoon - depending on workload.  No new se's with metformin , jardiance .  On statin. Lipitor 40mg  every day. No new myalgias/side effects.   Microalbumin: normal 12/17/22.  Optho, foot exam, pneumovax:  Optho - recent visit for injections. Recent optho eval a month ago - at [facility].   Lab Results  Component Value Date   HGBA1C 6.9 (H) 07/29/2023   HGBA1C 6.2 04/11/2023   HGBA1C 6.7 (H) 12/17/2022   Lab Results  Component Value Date   MICROALBUR 0.8 12/17/2022   LDLCALC 36 04/11/2023   CREATININE 1.00 12/11/2023    History Patient Active Problem List   Diagnosis Date Noted   Diabetic nephropathy (HCC) 05/01/2017   Diabetes (HCC) 05/01/2017   Peripheral neuropathy 05/01/2017   Past Medical History:  Diagnosis Date   Diabetes mellitus without complication (HCC)    GERD (gastroesophageal reflux disease)    Hyperlipidemia    Hypertension    Past Surgical History:  Procedure Laterality Date   Growth removed Left    TONSILLECTOMY     WISDOM TOOTH EXTRACTION     Allergies  Allergen Reactions   Ace Inhibitors Other (See Comments)    Hypotension, fatigue with  attempt at low dose ACE-I and ARB for microalbuminuria.    Angiotensin Receptor Blockers Other (See Comments)    Hypotension, fatigue with attempt at low dose ACE-I and ARB for microalbuminuria   Prior to Admission medications   Medication Sig Start Date End Date Taking? Authorizing Provider  aspirin  EC 81 MG tablet Take 1 tablet (81 mg total) by mouth daily. Swallow whole. 06/15/20  Yes O'Neal, Cathay Clonts, MD  atorvastatin  (LIPITOR) 40 MG tablet Take 1 tablet by mouth once daily 02/13/24  Yes Benjiman Bras, MD  blood glucose meter kit and supplies Dispense based on patient and insurance preference. Check twice per day. FOR ICD-9 250.00, 250.01). 04/29/17  Yes Benjiman Bras, MD  gabapentin  (NEURONTIN ) 300 MG capsule TAKE 1 TO 2 CAPSULES BY MOUTH THREE TIMES DAILY AS NEEDED 02/17/24  Yes Benjiman Bras, MD  glucose blood test strip Use as instructed 03/26/22  Yes Benjiman Bras, MD  JARDIANCE  10 MG TABS tablet TAKE 1 TABLET BY MOUTH ONCE DAILY BEFORE BREAKFAST 02/11/24  Yes Benjiman Bras, MD  Lancets 30G MISC Check sugar twice a day before meals 03/26/22  Yes Benjiman Bras, MD  metFORMIN  (GLUCOPHAGE ) 1000 MG tablet Take 1 tablet (1,000 mg total) by mouth 2 (two) times daily with a meal. 04/11/23  Yes Benjiman Bras, MD  atorvastatin  (  LIPITOR) 40 MG tablet Take 1 tablet (40 mg total) by mouth daily. Patient not taking: Reported on 02/17/2024 02/13/24   Benjiman Bras, MD  meclizine  (ANTIVERT ) 25 MG tablet Take 1 tablet (25 mg total) by mouth 3 (three) times daily as needed for dizziness. Patient not taking: Reported on 02/17/2024 12/11/23   Benjiman Bras, MD  methocarbamol  (ROBAXIN ) 500 MG tablet Take 1 tablet (500 mg total) by mouth every 8 (eight) hours as needed for muscle spasms. Patient not taking: Reported on 02/17/2024 11/18/23   Benjiman Bras, MD   Social History   Socioeconomic History   Marital status: Married    Spouse name: Not on file   Number of children: 2    Years of education: Not on file   Highest education level: Associate degree: academic program  Occupational History   Occupation: RT driver  Tobacco Use   Smoking status: Never   Smokeless tobacco: Never  Vaping Use   Vaping status: Never Used  Substance and Sexual Activity   Alcohol use: Yes    Comment: social   Drug use: No   Sexual activity: Yes  Other Topics Concern   Not on file  Social History Narrative   Not on file   Social Drivers of Health   Financial Resource Strain: Low Risk  (12/11/2023)   Overall Financial Resource Strain (CARDIA)    Difficulty of Paying Living Expenses: Not hard at all  Food Insecurity: No Food Insecurity (12/11/2023)   Hunger Vital Sign    Worried About Running Out of Food in the Last Year: Never true    Ran Out of Food in the Last Year: Never true  Transportation Needs: No Transportation Needs (12/11/2023)   PRAPARE - Administrator, Civil Service (Medical): No    Lack of Transportation (Non-Medical): No  Physical Activity: Sufficiently Active (12/11/2023)   Exercise Vital Sign    Days of Exercise per Week: 5 days    Minutes of Exercise per Session: 60 min  Stress: No Stress Concern Present (12/11/2023)   [employer] of Occupational Health - Occupational Stress Questionnaire    Feeling of Stress : Not at all  Social Connections: Unknown (12/11/2023)   Social Connection and Isolation Panel [NHANES]    Frequency of Communication with Friends and Family: More than three times a week    Frequency of Social Gatherings with Friends and Family: Twice a week    Attends Religious Services: Patient declined    Database administrator or Organizations: No    Attends Engineer, structural: Not on file    Marital Status: Married  Catering manager Violence: Not on file    Review of Systems  Constitutional:  Negative for fatigue and unexpected weight change.  Eyes:  Negative for visual disturbance.  Respiratory:  Negative  for cough, chest tightness and shortness of breath.   Cardiovascular:  Negative for chest pain, palpitations and leg swelling.  Gastrointestinal:  Negative for abdominal pain and blood in stool.  Neurological:  Negative for dizziness, light-headedness and headaches.     Objective:   Vitals:   02/17/24 1623  BP: 100/66  Pulse: 81  Temp: 98.4 F (36.9 C)  TempSrc: Oral  SpO2: 97%  Weight: 240 lb 3.2 oz (109 kg)  Height: 6' 2.5" (1.892 m)     Physical Exam Vitals reviewed.  Constitutional:      Appearance: He is well-developed.  HENT:     Head: Normocephalic  and atraumatic.  Neck:     Vascular: No carotid bruit or JVD.  Cardiovascular:     Rate and Rhythm: Normal rate and regular rhythm.     Heart sounds: Normal heart sounds. No murmur heard. Pulmonary:     Effort: Pulmonary effort is normal.     Breath sounds: Normal breath sounds. No rales.  Musculoskeletal:     Right lower leg: No edema.     Left lower leg: No edema.     Comments: Multiple varicose veins both lower legs bilaterally.  Notes area of discomfort at the varicose vein at the posterior calf but not tender at this time.  Calf circumference measured at 15 cm below patella, 44 cm on right, 43 cm on left, negative Homans.  Skin:    General: Skin is warm and dry.  Neurological:     Mental Status: He is alert and oriented to person, place, and time.  Psychiatric:        Mood and Affect: Mood normal.    Assessment & Plan:  David Crane is a 58 y.o. male . Symptomatic varicose veins, right - Plan: Ambulatory referral to Vascular Surgery, VAS US  LOWER EXTREMITY VENOUS (DVT) Right leg pain - Plan: VAS US  LOWER EXTREMITY VENOUS (DVT)  - Suspected symptomatic varicosities, but does complain of some posterior right leg pain, intermittent, recent travel to [place]  for cruise.  Minimal bill discrepancy of right versus left calf, less likely DVT but will check ultrasound, then referral to vein specialist to discuss  treatment options for varicose veins as they are symptomatic.  Type 2 diabetes mellitus with microalbuminuria, without long-term current use of insulin (HCC) - Plan: Hemoglobin A1c, Comprehensive metabolic panel with GFR, empagliflozin  (JARDIANCE ) 10 MG TABS tablet, metFORMIN  (GLUCOPHAGE ) 1000 MG tablet  - Check labs and adjust regimen accordingly.  Continue metformin , Jardiance  and gabapentin  for neuropathic symptoms.  Hyperlipidemia, unspecified hyperlipidemia type - Plan: Lipid panel  - Tolerating current dose statin, check lipid panel and adjust plan accordingly.  Hyperglycemia - Plan: empagliflozin  (JARDIANCE ) 10 MG TABS tablet  - Diabetes treatment as above.  Meds ordered this encounter  Medications   empagliflozin  (JARDIANCE ) 10 MG TABS tablet    Sig: Take 1 tablet (10 mg total) by mouth daily before breakfast.    Dispense:  90 tablet    Refill:  1   metFORMIN  (GLUCOPHAGE ) 1000 MG tablet    Sig: Take 1 tablet (1,000 mg total) by mouth 2 (two) times daily with a meal.    Dispense:  180 tablet    Refill:  2   Patient Instructions  Good I will refer you to vascular doctor for the varicose veins, and I have ordered an ultrasound but unlikely blood clot.  No medication changes for now, I will review your labs and let you know if any concerns.  Follow-up in 6 months as long as things are going well. Take care!    Signed,   Caro Christmas, MD [facility], [facility] 02/17/24 5:34 PM

## 2024-02-17 NOTE — Patient Instructions (Signed)
 Good I will refer you to vascular doctor for the varicose veins, and I have ordered an ultrasound but unlikely blood clot.  No medication changes for now, I will review your labs and let you know if any concerns.  Follow-up in 6 months as long as things are going well. Take care!

## 2024-02-18 ENCOUNTER — Encounter (HOSPITAL_COMMUNITY)

## 2024-02-18 ENCOUNTER — Encounter (HOSPITAL_COMMUNITY): Payer: Self-pay

## 2024-02-18 LAB — COMPREHENSIVE METABOLIC PANEL WITH GFR
ALT: 15 U/L (ref 0–53)
AST: 22 U/L (ref 0–37)
Albumin: 4.5 g/dL (ref 3.5–5.2)
Alkaline Phosphatase: 36 U/L — ABNORMAL LOW (ref 39–117)
BUN: 34 mg/dL — ABNORMAL HIGH (ref 6–23)
CO2: 28 meq/L (ref 19–32)
Calcium: 9.2 mg/dL (ref 8.4–10.5)
Chloride: 101 meq/L (ref 96–112)
Creatinine, Ser: 1.51 mg/dL — ABNORMAL HIGH (ref 0.40–1.50)
GFR: 50.76 mL/min — ABNORMAL LOW (ref >60.00)
Glucose, Bld: 129 mg/dL — ABNORMAL HIGH (ref 70–99)
Potassium: 4.5 meq/L (ref 3.5–5.1)
Sodium: 137 meq/L (ref 135–145)
Total Bilirubin: 1.2 mg/dL (ref 0.2–1.2)
Total Protein: 6.9 g/dL (ref 6.0–8.3)

## 2024-02-18 LAB — LIPID PANEL
Cholesterol: 81 mg/dL (ref 0–200)
HDL: 39.9 mg/dL (ref >39.00)
LDL Cholesterol: 24 mg/dL (ref 0–99)
NonHDL: 41.16
Total CHOL/HDL Ratio: 2
Triglycerides: 87 mg/dL (ref 0.0–149.0)
VLDL: 17.4 mg/dL (ref 0.0–40.0)

## 2024-02-18 LAB — HEMOGLOBIN A1C: Hgb A1c MFr Bld: 6.7 % — ABNORMAL HIGH (ref 4.6–6.5)

## 2024-02-19 ENCOUNTER — Telehealth (HOSPITAL_COMMUNITY): Payer: Self-pay

## 2024-02-19 NOTE — Telephone Encounter (Signed)
 Thank you for conversing with me this morning, It was a pleasure working with you.   Based on insurance information and additional information provided in the letter provided so you could get an estimate from the insurance. You decided this morning to not proceed with the DVT study, requested by Dr.Greene. As the referral to VVS was already completed, I went ahead and scheduled that appointment with you.   Due to work you requested both appointments to be as late as possible.   We scheduled the Vascular Ultrasound (Reflux Study) for 03/05/24 at 4:30. We also scheduled the Vascular MD appointment for 03/17/24 as late as possible, I will reach out after speaking with the Vascular schedulers to ensure that there is no mistake on my part and verify that time.   Hope you have a great rest of the day. If you have any questions or concerns about the appointments please contact me at [phone], or VVS at [phone].

## 2024-02-21 ENCOUNTER — Other Ambulatory Visit: Payer: Self-pay | Admitting: *Deleted

## 2024-02-21 DIAGNOSIS — I8393 Asymptomatic varicose veins of bilateral lower extremities: Secondary | ICD-10-CM

## 2024-02-22 ENCOUNTER — Ambulatory Visit: Payer: Self-pay | Admitting: Family Medicine

## 2024-02-22 DIAGNOSIS — R7989 Other specified abnormal findings of blood chemistry: Secondary | ICD-10-CM

## 2024-02-25 NOTE — Progress Notes (Signed)
 Noted response from patient regarding lab work.  I still would encourage him to have repeat labs when well-hydrated to make sure he is not having any acute renal insufficiency given the change from March labs to the most recent labs on June 2 - 50% increase in level at that time.  Let me know if he has questions or concerns and happy to talk to him further if needed.

## 2024-03-05 ENCOUNTER — Ambulatory Visit (HOSPITAL_COMMUNITY)
Admission: RE | Admit: 2024-03-05 | Discharge: 2024-03-05 | Disposition: A | Discharge status: Home / Self Care | Source: Ambulatory Visit | Attending: Vascular Surgery | Admitting: Vascular Surgery

## 2024-03-05 DIAGNOSIS — I8393 Asymptomatic varicose veins of bilateral lower extremities: Secondary | ICD-10-CM

## 2024-03-12 DIAGNOSIS — H43821 Vitreomacular adhesion, right eye: Secondary | ICD-10-CM | POA: Diagnosis not present

## 2024-03-12 DIAGNOSIS — H3582 Retinal ischemia: Secondary | ICD-10-CM | POA: Diagnosis not present

## 2024-03-12 DIAGNOSIS — E113213 Type 2 diabetes mellitus with mild nonproliferative diabetic retinopathy with macular edema, bilateral: Secondary | ICD-10-CM | POA: Diagnosis not present

## 2024-03-12 DIAGNOSIS — H43393 Other vitreous opacities, bilateral: Secondary | ICD-10-CM | POA: Diagnosis not present

## 2024-03-12 DIAGNOSIS — H43813 Vitreous degeneration, bilateral: Secondary | ICD-10-CM | POA: Diagnosis not present

## 2024-03-17 ENCOUNTER — Ambulatory Visit: Attending: Vascular Surgery | Admitting: Vascular Surgery

## 2024-03-17 ENCOUNTER — Encounter: Payer: Self-pay | Admitting: Vascular Surgery

## 2024-03-17 VITALS — BP 102/66 | HR 86 | Temp 98.0°F | Resp 20 | Ht 74.5 in | Wt 239.3 lb

## 2024-03-17 DIAGNOSIS — I872 Venous insufficiency (chronic) (peripheral): Secondary | ICD-10-CM

## 2024-03-17 DIAGNOSIS — I8393 Asymptomatic varicose veins of bilateral lower extremities: Secondary | ICD-10-CM

## 2024-03-17 NOTE — Progress Notes (Signed)
 [facility] [place]  ASSESSMENT / PLAN: 58 y.o. male with symptomatic bilateral lower extremity venous varicosities causing swelling and pain.  Patient counseled to initiate compression therapy.  He is measured for thigh-high compression stockings with a 20 to 30 mm Hg gradient.  He will follow-up with us  at 3 months to evaluate his response to medical therapy.  CHIEF COMPLAINT: Symptomatic varicose veins  HISTORY OF PRESENT ILLNESS: David Crane is a 58 y.o. male referred to clinic for evaluation of symptomatic varicose veins.  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  He has prominent varicosities of bilateral lower extremities which are bothersome to him.  These are painful.  He does notice swelling at the end of the day.  He is tried some nonmedical grade compression with some relief.  We reviewed his duplex in detail.  Past Medical History:  Diagnosis Date   Diabetes mellitus without complication (HCC)    GERD (gastroesophageal reflux disease)    Hyperlipidemia    Hypertension    Peripheral vascular disease (HCC)     Past Surgical History:  Procedure Laterality Date   Growth removed Left    TONSILLECTOMY     WISDOM TOOTH EXTRACTION      Family History  Problem Relation Age of Onset   Diabetes Father    Colon cancer Father    Other Father        Brain Tumor   Heart failure Brother    Other Brother        plastic bottle poisoning   Heart disease Paternal Grandfather    Esophageal cancer Neg Hx    Rectal cancer Neg Hx    Stomach cancer Neg Hx     Social History   Socioeconomic History   Marital status: Married    Spouse name: Not on file   Number of children: 2   Years of education: Not on file   Highest education level: Associate degree: academic program  Occupational History   Occupation: RT driver  Tobacco Use   Smoking status: Never   Smokeless tobacco: Never  Vaping Use   Vaping  status: Never Used  Substance and Sexual Activity   Alcohol use: Yes    Comment: social   Drug use: No   Sexual activity: Yes  Other Topics Concern   Not on file  Social History Narrative   Not on file   Social Drivers of Health   Financial Resource Strain: Low Risk  (12/11/2023)   Overall Financial Resource Strain (CARDIA)    Difficulty of Paying Living Expenses: Not hard at all  Food Insecurity: No Food Insecurity (12/11/2023)   Hunger Vital Sign    Worried About Running Out of Food in the Last Year: Never true    Ran Out of Food in the Last Year: Never true  Transportation Needs: No Transportation Needs (12/11/2023)   PRAPARE - Administrator, Civil Service (Medical): No    Lack of Transportation (Non-Medical): No  Physical Activity: Sufficiently Active (12/11/2023)   Exercise Vital Sign    Days of Exercise per Week: 5 days    Minutes of Exercise per Session: 60 min  Stress: No Stress Concern Present (12/11/2023)   [employer] of Occupational Health - Occupational Stress Questionnaire    Feeling of Stress : Not at all  Social Connections: Unknown (12/11/2023)   Social Connection and Isolation Panel    Frequency of  Communication with Friends and Family: More than three times a week    Frequency of Social Gatherings with Friends and Family: Twice a week    Attends Religious Services: Patient declined    Database administrator or Organizations: No    Attends Engineer, structural: Not on file    Marital Status: Married  Catering manager Violence: Not on file    Allergies  Allergen Reactions   Ace Inhibitors Other (See Comments)    Hypotension, fatigue with attempt at low dose ACE-I and ARB for microalbuminuria.    Angiotensin Receptor Blockers Other (See Comments)    Hypotension, fatigue with attempt at low dose ACE-I and ARB for microalbuminuria    Current Outpatient Medications  Medication Sig Dispense Refill   aspirin  EC 81 MG tablet Take 1  tablet (81 mg total) by mouth daily. Swallow whole. 90 tablet 3   atorvastatin  (LIPITOR) 40 MG tablet Take 1 tablet (40 mg total) by mouth daily. 90 tablet 2   blood glucose meter kit and supplies Dispense based on patient and insurance preference. Check twice per day. FOR ICD-9 250.00, 250.01). 1 each 0   empagliflozin  (JARDIANCE ) 10 MG TABS tablet Take 1 tablet (10 mg total) by mouth daily before breakfast. 90 tablet 1   gabapentin  (NEURONTIN ) 300 MG capsule TAKE 1 TO 2 CAPSULES BY MOUTH THREE TIMES DAILY AS NEEDED 540 capsule 0   glucose blood test strip Use as instructed 100 each 12   Lancets 30G MISC Check sugar twice a day before meals 200 each 2   metFORMIN  (GLUCOPHAGE ) 1000 MG tablet Take 1 tablet (1,000 mg total) by mouth 2 (two) times daily with a meal. 180 tablet 2   No current facility-administered medications for this visit.    PHYSICAL EXAM Vitals:   03/17/24 1541  BP: 102/66  Pulse: 86  Resp: 20  Temp: 98 F (36.7 C)  TempSrc: Temporal  SpO2: 95%  Weight: 239 lb 4.8 oz (108.5 kg)  Height: 6' 2.5 (1.892 m)   Well-appearing middle-age man in no distress Regular rate and rhythm Unlabored breathing 2+ dorsalis pedis pulses bilaterally Prominent varicose veins bilaterally  PERTINENT LABORATORY AND RADIOLOGIC DATA  Most recent CBC    Latest Ref Rng & Units 12/11/2023    1:12 PM 06/05/2022    8:48 AM 03/26/2022    5:14 PM  CBC  WBC 4.0 - 10.5 K/uL 5.2  5.4  7.7   Hemoglobin 13.0 - 17.0 g/dL 84.4  86.3  87.5   Hematocrit 39.0 - 52.0 % 46.9  41.4  37.5   Platelets 150.0 - 400.0 K/uL 202.0  209.0  253.0      Most recent CMP    Latest Ref Rng & Units 02/17/2024    4:17 PM 12/11/2023    1:12 PM 07/29/2023    4:07 PM  CMP  Glucose 70 - 99 mg/dL 870  893  862   BUN 6 - 23 mg/dL 34  23  31   Creatinine 0.40 - 1.50 mg/dL 8.48  8.99  8.64   Sodium 135 - 145 mEq/L 137  137  139   Potassium 3.5 - 5.1 mEq/L 4.5  4.5  4.6   Chloride 96 - 112 mEq/L 101  98  102   CO2 19  - 32 mEq/L 28  28  28    Calcium  8.4 - 10.5 mg/dL 9.2  9.7  9.1   Total Protein 6.0 - 8.3 g/dL 6.9   6.9  Total Bilirubin 0.2 - 1.2 mg/dL 1.2   0.8   Alkaline Phos 39 - 117 U/L 36   37   AST 0 - 37 U/L 22   19   ALT 0 - 53 U/L 15   14     Renal function CrCl cannot be calculated (Patient's most recent lab result is older than the maximum 21 days allowed.).  HbA1c POC (<> result, manual entry) (%)  Date Value  09/06/2021 6.8   Hgb A1c MFr Bld (%)  Date Value  02/17/2024 6.7 (H)    LDL Chol Calc (NIH)  Date Value Ref Range Status  11/16/2020 45 0 - 99 mg/dL Final   LDL Cholesterol  Date Value Ref Range Status  02/17/2024 24 0 - 99 mg/dL Final    Right:  - No evidence of deep vein thrombosis seen in the right lower extremity,  from the common femoral through the popliteal veins.  - No evidence of superficial venous thrombosis in the right lower  extremity.  - Venous reflux is noted in the right common femoral vein.  - Venous reflux is noted in the right sapheno-femoral junction.  - Venous reflux is noted in the right greater saphenous vein in the thigh.  - Venous reflux is noted in the right femoral vein.  - Venous reflux is noted in the right popliteal vein.  - Venous reflux is noted in the right perforator vein.    *See table(s) above for measurements and observations.   Debby SAILOR. Magda, MD FACS [facility] Phone Number: [phone] 03/17/2024 9:03 PM   Total time spent on preparing this encounter including chart review, data review, collecting history, examining the patient, and coordinating care: 45 minutes  Portions of this report may have been transcribed using voice recognition software.  Every effort has been made to ensure accuracy; however, inadvertent computerized transcription errors may still be present.

## 2024-04-20 ENCOUNTER — Telehealth: Payer: Self-pay

## 2024-04-20 NOTE — Telephone Encounter (Signed)
 Patient is requesting referral for knee pain. Patient was last seen 02/17/24 for right leg pain.    Copied from CRM [id number]. Topic: Referral - Question >> Apr 20, 2024  9:51 AM Mesmerise C wrote: Reason for CRM: Patient is requesting provider to send a referral for a knee doctor says Dr. Levora refers him to who he thinks is best done it before within [facility] in [place]

## 2024-04-21 NOTE — Telephone Encounter (Signed)
 Called patient and explained that he would need an appointment to have his knee looked at prior to replacing referral. Patient explained that he will go to an urgent care or [facility] to have it looked it. He really wants an xray. He also stated that our office is far from where he works and its difficult for him to schedule appointments

## 2024-04-21 NOTE — Telephone Encounter (Signed)
 He was seen on June 2, discussed calf discomfort, varicose veins at that time but not knee pain.  Please schedule visit so we can evaluate his knee pain and refer appropriately.  Depending on his symptoms can then decide on appropriate referral or initial treatment prior to that referral if needed.  Thanks!

## 2024-04-22 DIAGNOSIS — M25561 Pain in right knee: Secondary | ICD-10-CM | POA: Diagnosis not present

## 2024-05-07 DIAGNOSIS — M170 Bilateral primary osteoarthritis of knee: Secondary | ICD-10-CM | POA: Diagnosis not present

## 2024-05-07 DIAGNOSIS — M1711 Unilateral primary osteoarthritis, right knee: Secondary | ICD-10-CM | POA: Diagnosis not present

## 2024-05-21 DIAGNOSIS — E113213 Type 2 diabetes mellitus with mild nonproliferative diabetic retinopathy with macular edema, bilateral: Secondary | ICD-10-CM | POA: Diagnosis not present

## 2024-07-01 ENCOUNTER — Ambulatory Visit: Attending: Vascular Surgery | Admitting: Vascular Surgery

## 2024-07-01 ENCOUNTER — Encounter: Payer: Self-pay | Admitting: Vascular Surgery

## 2024-07-01 VITALS — BP 105/69 | HR 100 | Temp 98.2°F | Ht 74.5 in | Wt 235.0 lb

## 2024-07-01 DIAGNOSIS — I8393 Asymptomatic varicose veins of bilateral lower extremities: Secondary | ICD-10-CM

## 2024-07-01 NOTE — Progress Notes (Signed)
 Patient ID: David Crane, male   DOB: August 12, 1966, 58 y.o.   MRN: [id number]  Reason for Consult: Follow-up   Referred by Levora Reyes SAUNDERS, MD  Subjective:     HPI:  David Crane is a 58 y.o. male without significant vascular history.  He does have hyperlipidemia, hypertension and diabetes.  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  He has a long history of varicosities does not know if he has a family history.  He does not have any history of DVT.  He has associated swelling particularly at the end of the day.  The vein on the posterior right calf is particularly bothersome and causes at times moderate pain.  He does wear thigh-high compression stockings with some relief.  He has not had any bleeding or clotting issues.  Past Medical History:  Diagnosis Date   Diabetes mellitus without complication (HCC)    GERD (gastroesophageal reflux disease)    Hyperlipidemia    Hypertension    Peripheral vascular disease    Family History  Problem Relation Age of Onset   Diabetes Father    Colon cancer Father    Other Father        Brain Tumor   Heart failure Brother    Other Brother        plastic bottle poisoning   Heart disease Paternal Grandfather    Esophageal cancer Neg Hx    Rectal cancer Neg Hx    Stomach cancer Neg Hx    Past Surgical History:  Procedure Laterality Date   Growth removed Left    TONSILLECTOMY     WISDOM TOOTH EXTRACTION      Short Social History:  Social History   Tobacco Use   Smoking status: Never   Smokeless tobacco: Never  Substance Use Topics   Alcohol use: Yes    Comment: social    Allergies  Allergen Reactions   Ace Inhibitors Other (See Comments)    Hypotension, fatigue with attempt at low dose ACE-I and ARB for microalbuminuria.    Angiotensin Receptor Blockers Other (See Comments)    Hypotension, fatigue with attempt at low dose ACE-I and ARB for microalbuminuria    Current Outpatient  Medications  Medication Sig Dispense Refill   aspirin  EC 81 MG tablet Take 1 tablet (81 mg total) by mouth daily. Swallow whole. 90 tablet 3   atorvastatin  (LIPITOR) 40 MG tablet Take 1 tablet (40 mg total) by mouth daily. 90 tablet 2   blood glucose meter kit and supplies Dispense based on patient and insurance preference. Check twice per day. FOR ICD-9 250.00, 250.01). 1 each 0   empagliflozin  (JARDIANCE ) 10 MG TABS tablet Take 1 tablet (10 mg total) by mouth daily before breakfast. 90 tablet 1   gabapentin  (NEURONTIN ) 300 MG capsule TAKE 1 TO 2 CAPSULES BY MOUTH THREE TIMES DAILY AS NEEDED 540 capsule 0   glucose blood test strip Use as instructed 100 each 12   Lancets 30G MISC Check sugar twice a day before meals 200 each 2   metFORMIN  (GLUCOPHAGE ) 1000 MG tablet Take 1 tablet (1,000 mg total) by mouth 2 (two) times daily with a meal. 180 tablet 2   No current facility-administered medications for this visit.    Review of Systems  Constitutional:  Constitutional negative. HENT: HENT negative.  Eyes: Eyes negative.  Respiratory: Respiratory negative.  Cardiovascular: Positive for leg swelling.  GI: Gastrointestinal negative.  Musculoskeletal: Positive for leg pain and joint pain.  Skin: Skin negative.  Hematologic: Hematologic/lymphatic negative.  Psychiatric: Psychiatric negative.        Objective:  Objective   Vitals:   07/01/24 1526  BP: 105/69  Pulse: 100  Temp: 98.2 F (36.8 C)  SpO2: 94%  Weight: 235 lb (106.6 kg)  Height: 6' 2.5 (1.892 m)   Body mass index is 29.77 kg/m.  Physical Exam HENT:     Head: Normocephalic.     Nose: Nose normal.     Mouth/Throat:     Mouth: Mucous membranes are moist.  Cardiovascular:     Rate and Rhythm: Normal rate.     Pulses: Normal pulses.  Pulmonary:     Effort: Pulmonary effort is normal.  Abdominal:     General: Abdomen is flat.     Palpations: Abdomen is soft.  Musculoskeletal:     Cervical back: Normal range of  motion.     Right lower leg: Edema present.     Left lower leg: Edema present.  Skin:    General: Skin is warm.     Capillary Refill: Capillary refill takes less than 2 seconds.  Neurological:     General: No focal deficit present.     Mental Status: He is alert.        Data: Venous Reflux Times  +--------------+---------+------+-----------+------------+--------+  RIGHT        Reflux NoRefluxReflux TimeDiameter cmsComments                          Yes                                   +--------------+---------+------+-----------+------------+--------+  CFV                    yes   >1 second                       +--------------+---------+------+-----------+------------+--------+  FV mid                  yes   >1 second                       +--------------+---------+------+-----------+------------+--------+  Popliteal              yes   >1 second                       +--------------+---------+------+-----------+------------+--------+  GSV at SFJ              yes    >500 ms      1.04              +--------------+---------+------+-----------+------------+--------+  GSV prox thigh          yes    >500 ms      0.69              +--------------+---------+------+-----------+------------+--------+  GSV mid thigh           yes    >500 ms      0.6               +--------------+---------+------+-----------+------------+--------+  GSV dist thigh          yes    >500 ms      0.58              +--------------+---------+------+-----------+------------+--------+  GSV at knee   no                            0.48              +--------------+---------+------+-----------+------------+--------+  GSV prox calf no                            0.69              +--------------+---------+------+-----------+------------+--------+  GSV mid calf  no                            0.67               +--------------+---------+------+-----------+------------+--------+  GSV dist calf no                            0.47              +--------------+---------+------+-----------+------------+--------+  SSV Pop Fossa no                            0.37              +--------------+---------+------+-----------+------------+--------+  SSV prox calf no                            0.41              +--------------+---------+------+-----------+------------+--------+  SSV mid calf  no                            0.41              +--------------+---------+------+-----------+------------+--------+        Summary:  Right:  - No evidence of deep vein thrombosis seen in the right lower extremity,  from the common femoral through the popliteal veins.  - No evidence of superficial venous thrombosis in the right lower  extremity.  - Venous reflux is noted in the right common femoral vein.  - Venous reflux is noted in the right sapheno-femoral junction.  - Venous reflux is noted in the right greater saphenous vein in the thigh.  - Venous reflux is noted in the right femoral vein.  - Venous reflux is noted in the right popliteal vein.  - Venous reflux is noted in the right perforator vein.         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  We have discussed proceeding with right greater saphenous vein ablation and stab phlebectomy for greater than 20.  We discussed the risks including DVT as well as benefits and need for continued compression stockings perioperatively.  He demonstrates good understanding at this time unsure if he wants to proceed.  He can call to schedule.     Penne Lonni Colorado MD [facility]

## 2024-07-30 DIAGNOSIS — H3582 Retinal ischemia: Secondary | ICD-10-CM | POA: Diagnosis not present

## 2024-07-30 DIAGNOSIS — E113213 Type 2 diabetes mellitus with mild nonproliferative diabetic retinopathy with macular edema, bilateral: Secondary | ICD-10-CM | POA: Diagnosis not present

## 2024-07-30 DIAGNOSIS — H43821 Vitreomacular adhesion, right eye: Secondary | ICD-10-CM | POA: Diagnosis not present

## 2024-07-30 DIAGNOSIS — H43813 Vitreous degeneration, bilateral: Secondary | ICD-10-CM | POA: Diagnosis not present

## 2024-07-30 DIAGNOSIS — H43393 Other vitreous opacities, bilateral: Secondary | ICD-10-CM | POA: Diagnosis not present

## 2024-07-30 LAB — OPHTHALMOLOGY REPORT-SCANNED

## 2024-08-15 ENCOUNTER — Other Ambulatory Visit: Payer: Self-pay | Admitting: Family Medicine

## 2024-08-15 DIAGNOSIS — G629 Polyneuropathy, unspecified: Secondary | ICD-10-CM

## 2024-08-27 ENCOUNTER — Telehealth: Payer: Self-pay | Admitting: Family Medicine

## 2024-08-27 NOTE — Telephone Encounter (Signed)
 Can patient be placed at a 4pm slot for a 6 month follow up? 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

## 2024-08-27 NOTE — Telephone Encounter (Signed)
 Patient thought he scheduled a 6 month follow up when he was here in June but I don't see any future or past appointments for December. Jackolyn called him on Tuesday to schedule an appointment. 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 Since Dr. Levora was not here, he was told that we would have to get approval from Dr. Levora before we can schedule him in an acute spot. He stated that Jacquline has worked with him in the past on getting these appointments. Patient called back today asking to speak to [facility] regarding this 6 month F/U.    Copied from CRM #[id number]. Topic: Appointments - Scheduling Inquiry for Clinic >> Aug 27, 2024 11:43 AM Suzen RAMAN wrote: Reason for CRM: Patient would like a call back from [facility] pertaining to appointment time adjustment(10/08/24)/work in. 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    CB#[phone]

## 2024-08-27 NOTE — Telephone Encounter (Signed)
 I am okay with scheduling him for a 4 PM visit, just please note that on the appointment note that I okayed this time.  Thanks!

## 2024-09-02 ENCOUNTER — Encounter: Payer: Self-pay | Admitting: Podiatry

## 2024-09-02 ENCOUNTER — Ambulatory Visit: Admitting: Podiatry

## 2024-09-02 DIAGNOSIS — Q828 Other specified congenital malformations of skin: Secondary | ICD-10-CM

## 2024-09-02 DIAGNOSIS — E1142 Type 2 diabetes mellitus with diabetic polyneuropathy: Secondary | ICD-10-CM | POA: Diagnosis not present

## 2024-09-02 NOTE — Progress Notes (Signed)
°  Subjective:  Patient ID: David Crane, male    DOB: Oct 17, 1965,   MRN: [id number]  Chief Complaint  Patient presents with   Diabetes    She wanted to see me for a yearly follow-up.  She cut some things off it before.  I also want to see if I can get scanned for insoles today and then come back and order the shoes when I come back for the insoles.    58 y.o. male presents for diabetic foot check and to discuss getting diabetic shoes.  He relates his foot patient is diabetic and last A1c was  Lab Results  Component Value Date   HGBA1C 6.7 (H) 02/17/2024   .   PCP:  Levora Reyes SAUNDERS, MD    . Denies any other pedal complaints. Denies n/v/f/c.   Past Medical History:  Diagnosis Date   Diabetes mellitus without complication (HCC)    GERD (gastroesophageal reflux disease)    Hyperlipidemia    Hypertension    Peripheral vascular disease     Objective:  Physical Exam: Vascular: DP/PT pulses 2/4 bilateral. CFT <3 seconds. Absent hair growth on digits. Edema noted to bilateral lower extremities. Xerosis noted bilaterally.  Skin. No lacerations or abrasions bilateral feet. Nails 1-5 bilateral  are normal in appearance. Hyperkeratotic cored lesion noted sub fifth metatarsal on left.  Musculoskeletal: MMT 5/5 bilateral lower extremities in DF, PF, Inversion and Eversion. Deceased ROM in DF of ankle joint. Tailors bunion deformity noted with mild tenderness to lateral eminence.  Neurological: Sensation intact to light touch. Protective sensation diminished bilateral.    Assessment:   1. Type 2 diabetes mellitus with diabetic polyneuropathy, without long-term current use of insulin (HCC)   2. Porokeratosis       Plan:  Patient was evaluated and treated and all questions answered.  -Discussed and educated patient on diabetic foot care, especially with  regards to the vascular, neurological and musculoskeletal systems.  -Stressed the importance of good glycemic control and the  detriment of not  controlling glucose levels in relation to the foot. -Discussed supportive shoes at all times and checking feet regularly. -DM shoes ordered.  Biotic forms provided. -Hyperkeratotic tissue debrided without incident and treated with salyclic acid treatments.   -Answered all patient questions -Patient to return  in 1 year for diabetic foot check -Patient advised to call the office if any problems or questions arise in the meantime.    Asberry Failing, DPM

## 2024-10-07 ENCOUNTER — Ambulatory Visit: Admitting: Family Medicine

## 2024-10-07 VITALS — BP 110/64 | HR 88 | Temp 99.0°F | Resp 22 | Ht 74.5 in | Wt 235.0 lb

## 2024-10-07 DIAGNOSIS — E1142 Type 2 diabetes mellitus with diabetic polyneuropathy: Secondary | ICD-10-CM

## 2024-10-07 DIAGNOSIS — G629 Polyneuropathy, unspecified: Secondary | ICD-10-CM

## 2024-10-07 DIAGNOSIS — E785 Hyperlipidemia, unspecified: Secondary | ICD-10-CM

## 2024-10-07 DIAGNOSIS — R739 Hyperglycemia, unspecified: Secondary | ICD-10-CM

## 2024-10-07 DIAGNOSIS — Z7984 Long term (current) use of oral hypoglycemic drugs: Secondary | ICD-10-CM | POA: Diagnosis not present

## 2024-10-07 DIAGNOSIS — R809 Proteinuria, unspecified: Secondary | ICD-10-CM

## 2024-10-07 DIAGNOSIS — E1129 Type 2 diabetes mellitus with other diabetic kidney complication: Secondary | ICD-10-CM

## 2024-10-07 MED ORDER — GABAPENTIN 300 MG PO CAPS: ORAL_CAPSULE | ORAL | 2 refills | Status: AC

## 2024-10-07 MED ORDER — EMPAGLIFLOZIN 10 MG PO TABS: 10.0000 mg | ORAL_TABLET | Freq: Every day | ORAL | 1 refills | Status: AC

## 2024-10-07 MED ORDER — ATORVASTATIN CALCIUM 40 MG PO TABS: 40.0000 mg | ORAL_TABLET | Freq: Every day | ORAL | 2 refills | Status: AC

## 2024-10-07 MED ORDER — METFORMIN HCL 1000 MG PO TABS: 1000.0000 mg | ORAL_TABLET | Freq: Two times a day (BID) | ORAL | 2 refills | Status: AC

## 2024-10-07 NOTE — Patient Instructions (Signed)
 Thank you for coming in today. No change in medications at this time. If there are any concerns on your bloodwork, I will let you know. Take care!

## 2024-10-07 NOTE — Progress Notes (Signed)
 "  Subjective:  Patient ID: David Crane, male    DOB: 1965/10/31  Age: 59 y.o. MRN: [id number]  CC:  Chief Complaint  Patient presents with   Follow-up    Patient needs inserts for shoes filled out.     HPI David Crane presents for follow up.  Grandbaby born 9/6. She is doing well. Others are 59yo and 5 and 1/2.   Diabetes: Complicated by microalbuminuria, peripheral neuropathy, retinopathy followed by retina specialist.  Injections every 10 weeks for eyes - able to space out injections. Intolerant to ACE inhibitors.  Gabapentin  300mg  for neuropathic symptoms, 1 in the morning 2 at night and rare 1 in the afternoon - once per week or two, depending on workload when discussed last June. Same as in June.  Jardiance  10mg  every day,  metformin  for diabetes, he is on statin with Lipitor 40 mg daily without any myalgias or side effects. Home readings: Fasting - 102 Postprandial - 157 range  No 200's, no symptomatic lows.  Microalbumin: Normal microalbumin in April 2024, repeat testing ordered today. Wears diabetic foot inserts. Podiatry - Dr. Sikora. Needs fom completed.   Optho, foot exam, pneumovax:  Regular Optho care as above. Declines flu, shingrix, PNA vaccines   Lab Results  Component Value Date   HGBA1C 6.7 (H) 02/17/2024   HGBA1C 6.9 (H) 07/29/2023   HGBA1C 6.2 04/11/2023   Lab Results  Component Value Date   LDLCALC 24 02/17/2024   CREATININE 1.51 (H) 02/17/2024   Hyperlipidemia: Lipitor 40mg  every day. No new myalgias/side effects.  Lab Results  Component Value Date   CHOL 81 02/17/2024   HDL 39.90 02/17/2024   LDLCALC 24 02/17/2024   TRIG 87.0 02/17/2024   CHOLHDL 2 02/17/2024   Lab Results  Component Value Date   ALT 15 02/17/2024   AST 22 02/17/2024   ALKPHOS 36 (L) 02/17/2024   BILITOT 1.2 02/17/2024     History Patient Active Problem List   Diagnosis Date Noted   Primary osteoarthritis of both knees 05/07/2024   Pain, joint, knee, right  04/22/2024   Finger infection 03/01/2023   Diabetic nephropathy (HCC) 05/01/2017   Diabetes (HCC) 05/01/2017   Peripheral neuropathy 05/01/2017   Past Medical History:  Diagnosis Date   Diabetes mellitus without complication (HCC)    GERD (gastroesophageal reflux disease)    Hyperlipidemia    Hypertension    Peripheral vascular disease    Past Surgical History:  Procedure Laterality Date   Growth removed Left    TONSILLECTOMY     WISDOM TOOTH EXTRACTION     Allergies[1] Prior to Admission medications  Medication Sig Start Date End Date Taking? Authorizing Provider  aspirin  EC 81 MG tablet Take 1 tablet (81 mg total) by mouth daily. Swallow whole. 06/15/20  Yes O'Neal, Darryle Ned, MD  atorvastatin  (LIPITOR) 40 MG tablet Take 1 tablet (40 mg total) by mouth daily. 02/13/24  Yes Levora Reyes SAUNDERS, MD  blood glucose meter kit and supplies Dispense based on patient and insurance preference. Check twice per day. FOR ICD-9 250.00, 250.01). 04/29/17  Yes Levora Reyes SAUNDERS, MD  empagliflozin  (JARDIANCE ) 10 MG TABS tablet Take 1 tablet (10 mg total) by mouth daily before breakfast. 02/17/24  Yes Levora Reyes SAUNDERS, MD  gabapentin  (NEURONTIN ) 300 MG capsule TAKE 1 TO 2 CAPSULES BY MOUTH THREE TIMES DAILY AS NEEDED 08/18/24  Yes Levora Reyes SAUNDERS, MD  glucose blood test strip Use as instructed 03/26/22  Yes Levora Reyes  R, MD  Lancets 30G MISC Check sugar twice a day before meals 03/26/22  Yes Levora Reyes SAUNDERS, MD  metFORMIN  (GLUCOPHAGE ) 1000 MG tablet Take 1 tablet (1,000 mg total) by mouth 2 (two) times daily with a meal. 02/17/24  Yes Levora Reyes SAUNDERS, MD  Turmeric (QC TUMERIC COMPLEX PO) Take by mouth 2 (two) times daily.   Yes [provider]   Social History   Socioeconomic History   Marital status: Married    Spouse name: Not on file   Number of children: 2   Years of education: Not on file   Highest education level: Associate degree: academic program  Occupational History    Occupation: RT driver  Tobacco Use   Smoking status: Never   Smokeless tobacco: Never  Vaping Use   Vaping status: Never Used  Substance and Sexual Activity   Alcohol use: Not Currently    Comment: social   Drug use: No   Sexual activity: Yes  Other Topics Concern   Not on file  Social History Narrative   Not on file   Social Drivers of Health   Tobacco Use: Low Risk (09/02/2024)   Patient History    Smoking Tobacco Use: Never    Smokeless Tobacco Use: Never    Passive Exposure: Not on file  Financial Resource Strain: Low Risk (10/06/2024)   Overall Financial Resource Strain (CARDIA)    Difficulty of Paying Living Expenses: Not hard at all  Food Insecurity: No Food Insecurity (10/06/2024)   Epic    Worried About Radiation Protection Practitioner of Food in the Last Year: Never true    Ran Out of Food in the Last Year: Never true  Transportation Needs: No Transportation Needs (10/06/2024)   Epic    Lack of Transportation (Medical): No    Lack of Transportation (Non-Medical): No  Physical Activity: Unknown (10/06/2024)   Exercise Vital Sign    Days of Exercise per Week: Patient declined    Minutes of Exercise per Session: Not on file  Stress: No Stress Concern Present (10/06/2024)   [employer] of Occupational Health - Occupational Stress Questionnaire    Feeling of Stress: Not at all  Social Connections: Moderately Isolated (10/06/2024)   Social Connection and Isolation Panel    Frequency of Communication with Friends and Family: More than three times a week    Frequency of Social Gatherings with Friends and Family: Three times a week    Attends Religious Services: Patient declined    Active Member of Clubs or Organizations: No    Attends Banker Meetings: Not on file    Marital Status: Married  Catering Manager Violence: Not on file  Depression (PHQ2-9): Low Risk (07/29/2023)   Depression (PHQ2-9)    PHQ-2 Score: 0  Alcohol Screen: Low Risk (10/06/2024)   Alcohol Screen     Last Alcohol Screening Score (AUDIT): 1  Housing: Low Risk (10/06/2024)   Epic    Unable to Pay for Housing in the Last Year: No    Number of Times Moved in the Last Year: 0    Homeless in the Last Year: No  Utilities: Not on file  Health Literacy: Not on file    Review of Systems  Constitutional:  Negative for fatigue and unexpected weight change.  Eyes:  Negative for visual disturbance.  Respiratory:  Negative for cough, chest tightness and shortness of breath.   Cardiovascular:  Negative for chest pain, palpitations and leg swelling.  Gastrointestinal:  Negative for  abdominal pain and blood in stool.  Neurological:  Negative for dizziness, light-headedness and headaches.     Objective:   Vitals:   10/07/24 1609  BP: 110/64  Pulse: 88  Resp: (!) 22  Temp: 99 F (37.2 C)  TempSrc: Temporal  SpO2: 99%  Weight: 235 lb (106.6 kg)  Height: 6' 2.5 (1.892 m)     Physical Exam Vitals reviewed.  Constitutional:      Appearance: He is well-developed.  HENT:     Head: Normocephalic and atraumatic.  Neck:     Vascular: No carotid bruit or JVD.  Cardiovascular:     Rate and Rhythm: Normal rate and regular rhythm.     Heart sounds: Normal heart sounds. No murmur heard. Pulmonary:     Effort: Pulmonary effort is normal.     Breath sounds: Normal breath sounds. No rales.  Musculoskeletal:     Right lower leg: No edema.     Left lower leg: No edema.  Skin:    General: Skin is warm and dry.  Neurological:     Mental Status: He is alert and oriented to person, place, and time.  Psychiatric:        Mood and Affect: Mood normal.        Assessment & Plan:  David Crane is a 59 y.o. male . Hyperlipidemia, unspecified hyperlipidemia type - Plan: atorvastatin  (LIPITOR) 40 MG tablet  - Tolerating current dose of statin.  Labs obtained with stable readings.  No changes.  Type 2 diabetes mellitus with diabetic polyneuropathy, without long-term current use of  insulin (HCC) - Plan: Hemoglobin A1c, Comprehensive metabolic panel with GFR, Lipid panel, Urine Albumin/Creatinine with ratio (send out) [LAB689] Type 2 diabetes mellitus with microalbuminuria, without long-term current use of insulin (HCC) - Plan: atorvastatin  (LIPITOR) 40 MG tablet, empagliflozin  (JARDIANCE ) 10 MG TABS tablet, metFORMIN  (GLUCOPHAGE ) 1000 MG tablet  - Tolerating current med regimen.  A1c improved to 6.9.  No changes.  Microalbumin improved.  Hyperglycemia - Plan: empagliflozin  (JARDIANCE ) 10 MG TABS tablet  - As above, no med changes.  Peripheral polyneuropathy - Plan: gabapentin  (NEURONTIN ) 300 MG capsule  - Tolerating current dose of gabapentin , stable control of symptoms, continue same.  No orders of the defined types were placed in this encounter.  There are no Patient Instructions on file for this visit.    Signed,   Reyes Pines, MD [facility] 10/07/24 4:33 PM       [1]  Allergies Allergen Reactions   Ace Inhibitors Other (See Comments)    Hypotension, fatigue with attempt at low dose ACE-I and ARB for microalbuminuria.    Angiotensin Receptor Blockers Other (See Comments)    Hypotension, fatigue with attempt at low dose ACE-I and ARB for microalbuminuria   "

## 2024-10-08 ENCOUNTER — Ambulatory Visit: Admitting: Family Medicine

## 2024-10-08 LAB — COMPREHENSIVE METABOLIC PANEL WITH GFR
ALT: 16 U/L (ref 3–53)
AST: 24 U/L (ref 5–37)
Albumin: 4.6 g/dL (ref 3.5–5.2)
Alkaline Phosphatase: 36 U/L — ABNORMAL LOW (ref 39–117)
BUN: 32 mg/dL — ABNORMAL HIGH (ref 6–23)
CO2: 28 meq/L (ref 19–32)
Calcium: 9.5 mg/dL (ref 8.4–10.5)
Chloride: 101 meq/L (ref 96–112)
Creatinine, Ser: 1.35 mg/dL (ref 0.40–1.50)
GFR: 57.8 mL/min — ABNORMAL LOW
Glucose, Bld: 127 mg/dL — ABNORMAL HIGH (ref 70–99)
Potassium: 4.7 meq/L (ref 3.5–5.1)
Sodium: 138 meq/L (ref 135–145)
Total Bilirubin: 1.1 mg/dL (ref 0.2–1.2)
Total Protein: 7.2 g/dL (ref 6.0–8.3)

## 2024-10-08 LAB — LIPID PANEL
Cholesterol: 85 mg/dL (ref 28–200)
HDL: 37.4 mg/dL — ABNORMAL LOW
LDL Cholesterol: 32 mg/dL (ref 10–99)
NonHDL: 47.42
Total CHOL/HDL Ratio: 2
Triglycerides: 79 mg/dL (ref 10.0–149.0)
VLDL: 15.8 mg/dL (ref 0.0–40.0)

## 2024-10-08 LAB — HEMOGLOBIN A1C: Hgb A1c MFr Bld: 6.9 % — ABNORMAL HIGH (ref 4.6–6.5)

## 2024-10-08 LAB — MICROALBUMIN / CREATININE URINE RATIO
Creatinine,U: 119.9 mg/dL
Microalb Creat Ratio: 10.9 mg/g (ref 0.0–30.0)
Microalb, Ur: 1.3 mg/dL (ref 0.7–1.9)

## 2024-10-10 ENCOUNTER — Ambulatory Visit: Payer: Self-pay | Admitting: Family Medicine

## 2024-10-11 ENCOUNTER — Encounter: Payer: Self-pay | Admitting: Family Medicine

## 2025-04-14 ENCOUNTER — Encounter: Admitting: Family Medicine
